# Patient Record
Sex: Female | Born: 1950 | Race: White | Hispanic: No | Marital: Married | Smoking: Former smoker
Health system: Southern US, Community
[De-identification: ages and names within clinical notes are randomized; demographics above are authoritative.]

## PROBLEM LIST (undated history)

## (undated) DIAGNOSIS — E785 Hyperlipidemia, unspecified: Secondary | ICD-10-CM

## (undated) DIAGNOSIS — R0602 Shortness of breath: Secondary | ICD-10-CM

## (undated) DIAGNOSIS — C801 Malignant (primary) neoplasm, unspecified: Secondary | ICD-10-CM

## (undated) DIAGNOSIS — R7309 Other abnormal glucose: Secondary | ICD-10-CM

## (undated) DIAGNOSIS — F329 Major depressive disorder, single episode, unspecified: Secondary | ICD-10-CM

## (undated) DIAGNOSIS — J449 Chronic obstructive pulmonary disease, unspecified: Secondary | ICD-10-CM

## (undated) DIAGNOSIS — F419 Anxiety disorder, unspecified: Secondary | ICD-10-CM

## (undated) DIAGNOSIS — R7301 Impaired fasting glucose: Secondary | ICD-10-CM

## (undated) DIAGNOSIS — I10 Essential (primary) hypertension: Secondary | ICD-10-CM

## (undated) DIAGNOSIS — Z8659 Personal history of other mental and behavioral disorders: Secondary | ICD-10-CM

## (undated) DIAGNOSIS — N029 Recurrent and persistent hematuria with unspecified morphologic changes: Secondary | ICD-10-CM

## (undated) DIAGNOSIS — F32A Depression, unspecified: Secondary | ICD-10-CM

## (undated) DIAGNOSIS — Z780 Asymptomatic menopausal state: Secondary | ICD-10-CM

## (undated) HISTORY — DX: Essential (primary) hypertension: I10

## (undated) HISTORY — PX: COLONOSCOPY: SHX174

## (undated) HISTORY — PX: CYSTOSCOPY: SUR368

## (undated) HISTORY — DX: Personal history of other mental and behavioral disorders: Z86.59

## (undated) HISTORY — DX: Depression, unspecified: F32.A

## (undated) HISTORY — PX: LYMPH NODE BIOPSY: SHX201

## (undated) HISTORY — DX: Shortness of breath: R06.02

## (undated) HISTORY — DX: Other abnormal glucose: R73.09

## (undated) HISTORY — DX: Recurrent and persistent hematuria with unspecified morphologic changes: N02.9

## (undated) HISTORY — DX: Anxiety disorder, unspecified: F41.9

## (undated) HISTORY — DX: Asymptomatic menopausal state: Z78.0

## (undated) HISTORY — DX: Hyperlipidemia, unspecified: E78.5

## (undated) HISTORY — DX: Major depressive disorder, single episode, unspecified: F32.9

## (undated) HISTORY — DX: Impaired fasting glucose: R73.01

---

## 2003-11-21 ENCOUNTER — Ambulatory Visit (HOSPITAL_COMMUNITY): Admission: RE | Admit: 2003-11-21 | Discharge: 2003-11-21 | Payer: Self-pay | Admitting: Gastroenterology

## 2007-03-06 ENCOUNTER — Emergency Department (HOSPITAL_COMMUNITY): Admission: EM | Admit: 2007-03-06 | Discharge: 2007-03-06 | Payer: Self-pay | Admitting: Emergency Medicine

## 2008-04-25 ENCOUNTER — Other Ambulatory Visit: Admission: RE | Admit: 2008-04-25 | Discharge: 2008-04-25 | Payer: Self-pay | Admitting: Family Medicine

## 2011-01-31 NOTE — Op Note (Signed)
NAME:  Emily West, Emily West                      ACCOUNT NO.:  [id number]   MEDICAL RECORD NO.:  [id number]                   PATIENT TYPE:  AMB   LOCATION:  ENDO                                 FACILITY:  [facility]   PHYSICIAN:  Petra Kuba, M.D.                 DATE OF BIRTH:  08-Jun-1951   DATE OF PROCEDURE:  DATE OF DISCHARGE:                                 OPERATIVE REPORT   PROCEDURE:  Colonoscopy.   INDICATIONS:  Screening, history of diverticula.   Consent was signed after risks, benefits, methods, options thoroughly  discussed times in the office.   MEDICINES USED:  Demerol 80, Versed 8.   PROCEDURE:  Rectal inspection is pertinent for external hemorrhoid, small.  Digital exam is negative.  The video pediatric adjustable colonoscope was  inserted with some difficulty, and despite a low sigmoid anastomosis, we  were able to advance to her cecum.  This did require rolling her on her back  and some abdominal pressure.  There was some looping and tortuosity.  No  obvious abnormality was seen on insertion.  The cecum is identified by the  appendiceal orifice in the ileocecal valve.  The scope was inserted short  ways into the terminal ileum, which was normal.  Photo documentation was  obtained.  The scope was slowly withdrawn.  Prep was fairly adequate.  With  lots of washing and suctioning, adequate visualization was obtained.  The  scope was slowly withdrawn.  The cecum, ascending, and transverse was  normal.  There was a rare early left-sided diverticula remaining.  Although,  sigmoid anastomosis was seen and confirmed.  The scope was withdrawn back to  the rectum.  Anorectal pullthrough and retroflexion confirms some small  hemorrhoids.  There was a little bit of scope trauma on exam with a little  bit of heme.  The scope was reinserted short ways up the left side of the  colon.  Air was suctioned, and the scope removed.  Patient tolerated the  procedure well.  There were no  obvious immediate complications.   DIAGNOSIS:  1. Internal and external hemorrhoids.  2. Rare left-sided diverticula.  3. Low sigmoid anastomosis.  4. Otherwise within normal limits through the terminal ileum.   PLAN:  Yearly rectals and guaiacs per Dr. Chestine Spore.  Have ___________ p.r.n.  Otherwise repeat screening in 5-10 years.                                               Petra Kuba, M.D.    MEM/MEDQ  D:  11/21/2003  T:  11/21/2003  Job:  [id number]   cc:   Clovis Riley, NP  [facility]

## 2011-07-02 LAB — RAPID URINE DRUG SCREEN, HOSP PERFORMED
Amphetamines: NOT DETECTED
Benzodiazepines: NOT DETECTED
Cocaine: NOT DETECTED
Tetrahydrocannabinol: NOT DETECTED

## 2011-07-02 LAB — COMPREHENSIVE METABOLIC PANEL
Alkaline Phosphatase: 81
BUN: 14
Calcium: 9.1
Creatinine, Ser: 0.79
Glucose, Bld: 145 — ABNORMAL HIGH
Potassium: 3.8
Total Protein: 6.9

## 2011-07-02 LAB — SALICYLATE LEVEL: Salicylate Lvl: <4

## 2011-07-02 LAB — ACETAMINOPHEN LEVEL: Acetaminophen (Tylenol), Serum: <10 — ABNORMAL LOW

## 2013-09-13 ENCOUNTER — Encounter: Payer: Self-pay | Admitting: *Deleted

## 2013-09-14 ENCOUNTER — Encounter: Payer: Self-pay | Admitting: *Deleted

## 2013-09-21 ENCOUNTER — Ambulatory Visit: Payer: Self-pay | Admitting: Cardiology

## 2014-08-04 ENCOUNTER — Other Ambulatory Visit: Payer: Self-pay | Admitting: Family Medicine

## 2014-08-04 ENCOUNTER — Ambulatory Visit
Admission: RE | Admit: 2014-08-04 | Discharge: 2014-08-04 | Disposition: A | Discharge status: Home / Self Care | Payer: BC Managed Care – PPO | Source: Ambulatory Visit | Attending: Family Medicine | Admitting: Family Medicine

## 2014-08-04 DIAGNOSIS — M545 Low back pain: Secondary | ICD-10-CM

## 2014-08-08 ENCOUNTER — Emergency Department (HOSPITAL_COMMUNITY): Payer: BC Managed Care – PPO

## 2014-08-08 ENCOUNTER — Encounter (HOSPITAL_COMMUNITY): Payer: Self-pay | Admitting: Emergency Medicine

## 2014-08-08 ENCOUNTER — Inpatient Hospital Stay (HOSPITAL_COMMUNITY)
Admission: EM | Admit: 2014-08-08 | Discharge: 2014-08-22 | DRG: 515 | Disposition: A | Discharge status: Home Health | Payer: BC Managed Care – PPO | Attending: Internal Medicine | Admitting: Internal Medicine

## 2014-08-08 DIAGNOSIS — E871 Hypo-osmolality and hyponatremia: Secondary | ICD-10-CM | POA: Diagnosis present

## 2014-08-08 DIAGNOSIS — D649 Anemia, unspecified: Secondary | ICD-10-CM | POA: Diagnosis present

## 2014-08-08 DIAGNOSIS — M549 Dorsalgia, unspecified: Secondary | ICD-10-CM

## 2014-08-08 DIAGNOSIS — J811 Chronic pulmonary edema: Secondary | ICD-10-CM

## 2014-08-08 DIAGNOSIS — M545 Low back pain, unspecified: Secondary | ICD-10-CM

## 2014-08-08 DIAGNOSIS — J810 Acute pulmonary edema: Secondary | ICD-10-CM | POA: Diagnosis present

## 2014-08-08 DIAGNOSIS — C3490 Malignant neoplasm of unspecified part of unspecified bronchus or lung: Secondary | ICD-10-CM | POA: Diagnosis present

## 2014-08-08 DIAGNOSIS — R159 Full incontinence of feces: Secondary | ICD-10-CM | POA: Diagnosis not present

## 2014-08-08 DIAGNOSIS — J9621 Acute and chronic respiratory failure with hypoxia: Secondary | ICD-10-CM | POA: Diagnosis not present

## 2014-08-08 DIAGNOSIS — F329 Major depressive disorder, single episode, unspecified: Secondary | ICD-10-CM | POA: Diagnosis present

## 2014-08-08 DIAGNOSIS — C7951 Secondary malignant neoplasm of bone: Secondary | ICD-10-CM | POA: Diagnosis present

## 2014-08-08 DIAGNOSIS — E860 Dehydration: Secondary | ICD-10-CM | POA: Diagnosis not present

## 2014-08-08 DIAGNOSIS — I10 Essential (primary) hypertension: Secondary | ICD-10-CM | POA: Diagnosis present

## 2014-08-08 DIAGNOSIS — K5900 Constipation, unspecified: Secondary | ICD-10-CM | POA: Diagnosis not present

## 2014-08-08 DIAGNOSIS — Z800 Family history of malignant neoplasm of digestive organs: Secondary | ICD-10-CM | POA: Diagnosis not present

## 2014-08-08 DIAGNOSIS — M4850XA Collapsed vertebra, not elsewhere classified, site unspecified, initial encounter for fracture: Secondary | ICD-10-CM

## 2014-08-08 DIAGNOSIS — Z79899 Other long term (current) drug therapy: Secondary | ICD-10-CM

## 2014-08-08 DIAGNOSIS — E876 Hypokalemia: Secondary | ICD-10-CM | POA: Diagnosis not present

## 2014-08-08 DIAGNOSIS — G9340 Encephalopathy, unspecified: Secondary | ICD-10-CM | POA: Diagnosis present

## 2014-08-08 DIAGNOSIS — Z7189 Other specified counseling: Secondary | ICD-10-CM

## 2014-08-08 DIAGNOSIS — Z720 Tobacco use: Secondary | ICD-10-CM

## 2014-08-08 DIAGNOSIS — Z9889 Other specified postprocedural states: Secondary | ICD-10-CM | POA: Diagnosis not present

## 2014-08-08 DIAGNOSIS — M8458XA Pathological fracture in neoplastic disease, other specified site, initial encounter for fracture: Secondary | ICD-10-CM | POA: Diagnosis present

## 2014-08-08 DIAGNOSIS — J449 Chronic obstructive pulmonary disease, unspecified: Secondary | ICD-10-CM | POA: Diagnosis present

## 2014-08-08 DIAGNOSIS — C771 Secondary and unspecified malignant neoplasm of intrathoracic lymph nodes: Secondary | ICD-10-CM | POA: Diagnosis present

## 2014-08-08 DIAGNOSIS — I959 Hypotension, unspecified: Secondary | ICD-10-CM | POA: Diagnosis present

## 2014-08-08 DIAGNOSIS — F419 Anxiety disorder, unspecified: Secondary | ICD-10-CM | POA: Diagnosis present

## 2014-08-08 DIAGNOSIS — C7931 Secondary malignant neoplasm of brain: Secondary | ICD-10-CM | POA: Diagnosis present

## 2014-08-08 DIAGNOSIS — F17210 Nicotine dependence, cigarettes, uncomplicated: Secondary | ICD-10-CM | POA: Diagnosis present

## 2014-08-08 DIAGNOSIS — IMO0002 Reserved for concepts with insufficient information to code with codable children: Secondary | ICD-10-CM

## 2014-08-08 DIAGNOSIS — J9610 Chronic respiratory failure, unspecified whether with hypoxia or hypercapnia: Secondary | ICD-10-CM | POA: Insufficient documentation

## 2014-08-08 DIAGNOSIS — Z803 Family history of malignant neoplasm of breast: Secondary | ICD-10-CM | POA: Diagnosis not present

## 2014-08-08 DIAGNOSIS — E785 Hyperlipidemia, unspecified: Secondary | ICD-10-CM | POA: Diagnosis present

## 2014-08-08 DIAGNOSIS — R0902 Hypoxemia: Secondary | ICD-10-CM

## 2014-08-08 DIAGNOSIS — R590 Localized enlarged lymph nodes: Secondary | ICD-10-CM

## 2014-08-08 DIAGNOSIS — Z87898 Personal history of other specified conditions: Secondary | ICD-10-CM

## 2014-08-08 DIAGNOSIS — M8440XA Pathological fracture, unspecified site, initial encounter for fracture: Secondary | ICD-10-CM | POA: Insufficient documentation

## 2014-08-08 DIAGNOSIS — R599 Enlarged lymph nodes, unspecified: Secondary | ICD-10-CM

## 2014-08-08 LAB — I-STAT CHEM 8, ED
BUN: 18 mg/dL (ref 6–23)
CHLORIDE: 100 meq/L (ref 96–112)
CREATININE: 0.8 mg/dL (ref 0.50–1.10)
Calcium, Ion: 1.11 mmol/L — ABNORMAL LOW (ref 1.13–1.30)
GLUCOSE: 122 mg/dL — AB (ref 70–99)
HCT: 41 % (ref 36.0–46.0)
Hemoglobin: 13.9 g/dL (ref 12.0–15.0)
Potassium: 4.2 mEq/L (ref 3.7–5.3)
SODIUM: 133 meq/L — AB (ref 137–147)
TCO2: 23 mmol/L (ref 0–100)

## 2014-08-08 LAB — URINALYSIS, ROUTINE W REFLEX MICROSCOPIC
BILIRUBIN URINE: NEGATIVE
GLUCOSE, UA: NEGATIVE mg/dL
HGB URINE DIPSTICK: NEGATIVE
KETONES UR: NEGATIVE mg/dL
Leukocytes, UA: NEGATIVE
Nitrite: NEGATIVE
PROTEIN: NEGATIVE mg/dL
Specific Gravity, Urine: 1.03 (ref 1.005–1.030)
UROBILINOGEN UA: 0.2 mg/dL (ref 0.0–1.0)
pH: 5 (ref 5.0–8.0)

## 2014-08-08 LAB — CBC WITH DIFFERENTIAL/PLATELET
BASOS PCT: 0 % (ref 0–1)
Basophils Absolute: 0 10*3/uL (ref 0.0–0.1)
EOS ABS: 0.1 10*3/uL (ref 0.0–0.7)
EOS PCT: 1 % (ref 0–5)
HCT: 41.9 % (ref 36.0–46.0)
HEMOGLOBIN: 14.1 g/dL (ref 12.0–15.0)
LYMPHS ABS: 2.7 10*3/uL (ref 0.7–4.0)
Lymphocytes Relative: 18 % (ref 12–46)
MCH: 30.5 pg (ref 26.0–34.0)
MCHC: 33.7 g/dL (ref 30.0–36.0)
MCV: 90.7 fL (ref 78.0–100.0)
MONOS PCT: 8 % (ref 3–12)
Monocytes Absolute: 1.1 10*3/uL — ABNORMAL HIGH (ref 0.1–1.0)
NEUTROS PCT: 73 % (ref 43–77)
Neutro Abs: 11.1 10*3/uL — ABNORMAL HIGH (ref 1.7–7.7)
Platelets: 335 10*3/uL (ref 150–400)
RBC: 4.62 MIL/uL (ref 3.87–5.11)
RDW: 13.7 % (ref 11.5–15.5)
WBC: 15 10*3/uL — ABNORMAL HIGH (ref 4.0–10.5)

## 2014-08-08 LAB — BASIC METABOLIC PANEL
Anion gap: 15 (ref 5–15)
BUN: 18 mg/dL (ref 6–23)
CALCIUM: 9.6 mg/dL (ref 8.4–10.5)
CO2: 23 mEq/L (ref 19–32)
CREATININE: 0.85 mg/dL (ref 0.50–1.10)
Chloride: 97 mEq/L (ref 96–112)
GFR, EST AFRICAN AMERICAN: 85 mL/min — AB (ref >90)
GFR, EST NON AFRICAN AMERICAN: 73 mL/min — AB (ref >90)
GLUCOSE: 106 mg/dL — AB (ref 70–99)
POTASSIUM: 5.8 meq/L — AB (ref 3.7–5.3)
Sodium: 135 mEq/L — ABNORMAL LOW (ref 137–147)

## 2014-08-08 MED ORDER — IOHEXOL 300 MG/ML  SOLN
100.0000 mL | Freq: Once | INTRAMUSCULAR | Status: AC | PRN
  Administered 2014-08-08: 100 mL via INTRAVENOUS

## 2014-08-08 MED ORDER — BUPROPION HCL ER (XL) 300 MG PO TB24
750.0000 mg | ORAL_TABLET | Freq: Every day | ORAL | Status: DC
  Administered 2014-08-10 – 2014-08-14 (×5): 750 mg via ORAL
  Filled 2014-08-08 (×8): qty 1

## 2014-08-08 MED ORDER — FLUOXETINE HCL 20 MG PO CAPS
20.0000 mg | ORAL_CAPSULE | Freq: Three times a day (TID) | ORAL | Status: DC
  Filled 2014-08-08: qty 1

## 2014-08-08 MED ORDER — ONDANSETRON HCL 4 MG/2ML IJ SOLN
4.0000 mg | Freq: Once | INTRAMUSCULAR | Status: AC
  Administered 2014-08-08: 4 mg via INTRAVENOUS
  Filled 2014-08-08: qty 2

## 2014-08-08 MED ORDER — NICOTINE 14 MG/24HR TD PT24
14.0000 mg | MEDICATED_PATCH | Freq: Every day | TRANSDERMAL | Status: DC
  Administered 2014-08-10 – 2014-08-18 (×9): 14 mg via TRANSDERMAL
  Filled 2014-08-08 (×12): qty 1

## 2014-08-08 MED ORDER — CYCLOBENZAPRINE HCL 10 MG PO TABS
10.0000 mg | ORAL_TABLET | Freq: Every day | ORAL | Status: DC
  Administered 2014-08-08 – 2014-08-13 (×6): 10 mg via ORAL
  Filled 2014-08-08 (×7): qty 1

## 2014-08-08 MED ORDER — IOHEXOL 300 MG/ML  SOLN
50.0000 mL | Freq: Once | INTRAMUSCULAR | Status: AC | PRN
  Administered 2014-08-08: 50 mL via ORAL

## 2014-08-08 MED ORDER — FLUOXETINE HCL 20 MG PO CAPS
20.0000 mg | ORAL_CAPSULE | Freq: Three times a day (TID) | ORAL | Status: DC
  Administered 2014-08-09 – 2014-08-17 (×25): 20 mg via ORAL
  Filled 2014-08-08 (×27): qty 1

## 2014-08-08 MED ORDER — HYDROMORPHONE HCL 1 MG/ML IJ SOLN
0.5000 mg | Freq: Once | INTRAMUSCULAR | Status: AC
  Administered 2014-08-08: 0.5 mg via INTRAVENOUS
  Filled 2014-08-08: qty 1

## 2014-08-08 MED ORDER — SODIUM CHLORIDE 0.9 % IV SOLN
INTRAVENOUS | Status: DC
  Administered 2014-08-08 – 2014-08-09 (×2): via INTRAVENOUS

## 2014-08-08 MED ORDER — ZOLPIDEM TARTRATE 5 MG PO TABS: 5.0000 mg | ORAL_TABLET | Freq: Every evening | ORAL | Status: DC | PRN

## 2014-08-08 MED ORDER — ONDANSETRON 4 MG PO TBDP
4.0000 mg | ORAL_TABLET | Freq: Three times a day (TID) | ORAL | Status: DC | PRN
  Administered 2014-08-17: 4 mg via ORAL
  Filled 2014-08-08 (×2): qty 1

## 2014-08-08 MED ORDER — CYCLOBENZAPRINE HCL 10 MG PO TABS
10.0000 mg | ORAL_TABLET | Freq: Every day | ORAL | Status: DC
  Filled 2014-08-08: qty 1

## 2014-08-08 MED ORDER — PANTOPRAZOLE SODIUM 40 MG PO TBEC
40.0000 mg | DELAYED_RELEASE_TABLET | Freq: Every day | ORAL | Status: DC
  Filled 2014-08-08: qty 1

## 2014-08-08 MED ORDER — IRBESARTAN 75 MG PO TABS
75.0000 mg | ORAL_TABLET | Freq: Every day | ORAL | Status: DC
  Filled 2014-08-08 (×4): qty 1

## 2014-08-08 MED ORDER — HYDROMORPHONE HCL 1 MG/ML IJ SOLN
1.0000 mg | INTRAMUSCULAR | Status: DC | PRN
  Administered 2014-08-08 – 2014-08-09 (×5): 1 mg via INTRAVENOUS
  Filled 2014-08-08 (×5): qty 1

## 2014-08-08 MED ORDER — SIMVASTATIN 40 MG PO TABS
40.0000 mg | ORAL_TABLET | Freq: Every day | ORAL | Status: DC
  Administered 2014-08-08 – 2014-08-21 (×14): 40 mg via ORAL
  Filled 2014-08-08 (×15): qty 1

## 2014-08-08 MED ORDER — VALSARTAN-HYDROCHLOROTHIAZIDE 80-12.5 MG PO TABS: 1.0000 | ORAL_TABLET | Freq: Every day | ORAL | Status: DC

## 2014-08-08 MED ORDER — CYCLOBENZAPRINE HCL 10 MG PO TABS: 10.0000 mg | ORAL_TABLET | Freq: Every day | ORAL | Status: DC

## 2014-08-08 MED ORDER — HYDROMORPHONE HCL 1 MG/ML IJ SOLN
1.0000 mg | Freq: Once | INTRAMUSCULAR | Status: AC
Start: 2014-08-08 — End: 2014-08-08
  Administered 2014-08-08: 1 mg via INTRAVENOUS
  Filled 2014-08-08: qty 1

## 2014-08-08 MED ORDER — ARIPIPRAZOLE 2 MG PO TABS
2.0000 mg | ORAL_TABLET | Freq: Every day | ORAL | Status: DC
  Administered 2014-08-10 – 2014-08-17 (×8): 2 mg via ORAL
  Filled 2014-08-08 (×9): qty 1

## 2014-08-08 MED ORDER — HYDROMORPHONE HCL 1 MG/ML IJ SOLN
1.0000 mg | Freq: Once | INTRAMUSCULAR | Status: AC
  Administered 2014-08-08: 1 mg via INTRAVENOUS
  Filled 2014-08-08: qty 1

## 2014-08-08 MED ORDER — ZOLPIDEM TARTRATE 10 MG PO TABS: 10.0000 mg | ORAL_TABLET | Freq: Every evening | ORAL | Status: DC | PRN

## 2014-08-08 MED ORDER — TIOTROPIUM BROMIDE MONOHYDRATE 18 MCG IN CAPS
18.0000 ug | ORAL_CAPSULE | Freq: Every day | RESPIRATORY_TRACT | Status: DC
  Administered 2014-08-10 – 2014-08-22 (×11): 18 ug via RESPIRATORY_TRACT
  Filled 2014-08-08 (×4): qty 5

## 2014-08-08 MED ORDER — HYDROCHLOROTHIAZIDE 12.5 MG PO CAPS
12.5000 mg | ORAL_CAPSULE | Freq: Every day | ORAL | Status: DC
  Filled 2014-08-08 (×4): qty 1

## 2014-08-08 MED ORDER — GADOBENATE DIMEGLUMINE 529 MG/ML IV SOLN
20.0000 mL | Freq: Once | INTRAVENOUS | Status: AC | PRN
  Administered 2014-08-08: 20 mL via INTRAVENOUS

## 2014-08-08 NOTE — ED Notes (Signed)
Patient transported to MRI 

## 2014-08-08 NOTE — ED Notes (Signed)
Dr Betsey Holiday bedside

## 2014-08-08 NOTE — ED Notes (Signed)
Patient transported to CT 

## 2014-08-08 NOTE — ED Notes (Signed)
Contacted patients spouse Tim per her request

## 2014-08-08 NOTE — Consult Note (Signed)
Re:   Emily West [facility] DOB:   Apr 21, 1951 MRN:   [id number]  [facility] Consultation note  ASSESSMENT AND PLAN: 1.  Pathologic fracture - T10 and L3  2.  Adenopathy - thoracic, supraclavicular, right axilla  Discussed biopsy of a lymph node tomorrow both with the patient and her husband (by phone).  Will try to schedule.  Keep NPO past MN.  3.  History of prior left breast phylloides tumor  Surgery by Emily West, reconstruction by Emily West 4.  HTN 5.  History of anxiety/depression 6.  Gallstones 7.  Hiatal hernia on CT 8.  Smokes - about one PPD  Chief Complaint  Patient presents with  . Back Pain    lower   REFERRING PHYSICIAN:  Dr. Wardell West, hospitalist  HISTORY OF PRESENT ILLNESS: Emily West is a 63 y.o. (DOB: 1951/06/03) white  female whose primary care physician is Emily West. (Dr. Arvid Right FP) and comes to [facility] today for back pain.  The pain in her back began about 2 weeks ago.  She has had some chronic back issues and thought it was just an exacerbation of that.  She was seen by a chiropractor at [place], without any improvement in her symptoms.  Last Thursday, 08/03/2014, she saw Emily West, who placed her on a muscle relaxant and steroids (Prednisone), but the pain persisted.  She had lumbar spine films on 08/04/2014, which were negative.  It sounds like she contacted Emily West office several times yesterday because of persistent back pain, but was referred to the [facility] today.  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  CT chest/abdomen - 08/09/2014 - Thoracic lymphadenopathy, including right supraclavicular and axillary lymphadenopathy, as above. Emily suspicious lymphadenopathy in the abdomen or pelvis.   Pathologic fractures involving T10 and L3, better evaluated on MRI. Hgb - 14.1 - 08/08/2014 K+ - 5.8 - 08/08/2014   Past Medical History    Diagnosis Date  . History of suicidal ideation   . HTN (hypertension)   . HLD (hyperlipidemia)   . Shortness of breath   . Anxiety and depression   . Menopause   . Benign hematuria   . Impaired fasting glucose   . Other abnormal glucose       Past Surgical History  Procedure Laterality Date  . Colonoscopy  11/21/2003     rare early left-sided diverticula remaining, low sigmoid anastomosis, internal and external hemorrhoids,   . Cystoscopy        Current Facility-Administered Medications  Medication Dose Route Frequency Provider Last Rate Last Dose  . HYDROmorphone (DILAUDID) injection 1 mg  1 mg Intravenous Q3H PRN Emily Karlyne Greenspan, MD      . nicotine (NICODERM CQ - dosed in mg/24 hours) patch 14 mg  14 mg Transdermal Daily Emily Karlyne Greenspan, MD       Current Outpatient Prescriptions  Medication Sig Dispense Refill  . ARIPiprazole (ABILIFY) 2 MG tablet Take 2 mg by mouth daily.    Marland Kitchen buPROPion (WELLBUTRIN XL) 150 MG 24 hr tablet Take 750 mg by mouth daily.    . cyclobenzaprine (FLEXERIL) 10 MG tablet Take 10 mg by mouth at bedtime.    Marland Kitchen esomeprazole (NEXIUM) 20 MG capsule Take 20 mg by mouth every morning.    Marland Kitchen FLUoxetine (PROZAC) 20 MG capsule Take 20 mg by mouth 3 (three) times daily.    Marland Kitchen  methocarbamol (ROBAXIN) 500 MG tablet Take 1,000 mg by mouth every 3 (three) hours as needed for muscle spasms.    . simvastatin (ZOCOR) 40 MG tablet Take 40 mg by mouth at bedtime.     Marland Kitchen tiotropium (SPIRIVA) 18 MCG inhalation capsule Place 18 mcg into inhaler and inhale daily.    . valsartan-hydrochlorothiazide (DIOVAN-HCT) 80-12.5 MG per tablet Take 1 tablet by mouth daily.    Marland Kitchen zolpidem (AMBIEN) 10 MG tablet Take 10 mg by mouth at bedtime as needed for sleep.    . predniSONE (DELTASONE) 10 MG tablet Take 10 mg by mouth daily with breakfast.       Emily Known Allergies  REVIEW OF SYSTEMS: Skin:  Emily history of rash.  Emily history of abnormal moles. Infection:  Emily history of hepatitis or  HIV.  Emily history of MRSA. Neurologic:  Emily history of stroke.  Emily history of seizure.  Emily history of headaches. Cardiac:  Hypertension x 15 years. Pulmonary:  Smokes x 30 years..  Endocrine:  Emily diabetes. Emily thyroid disease. Gastrointestinal:  She had perforated diverticulitis around 1994.  She had a ostomy, then this was reversed.  Dr. Harlow West was her surgeon.  Hiatal hernia and gallstones on CT scan. Urologic:  Emily history of kidney stones.  Emily history of bladder infections. Musculoskeletal:  See HPI. Hematologic:  Emily bleeding disorder.  Emily history of anemia.  Not anticoagulated. Psycho-social:  The patient is oriented.   She has a history of anxiety and depression.  SOCIAL and FAMILY HISTORY: Married.  Husband Emily West.  Cell phone numbers:  [phone] and [phone].  PHYSICAL EXAM: BP 148/84 mmHg  Pulse 81  Temp(Src) 97.5 F (36.4 C) (Oral)  Resp 20  SpO2 93%  General: Mildly obese WF who is alert.  She has a little slurred speech from medication. HEENT: Normal. Pupils equal. Neck: Supple. Emily mass.  Emily thyroid mass. Lymph Nodes:  Emily supraclavicular or cervical nodes.  Though there are enlarged supraclavicular nodes on CT, I cannot feel them at this time.  I can feel an approx 2.0 cm node high in her right axilla.  Breasts:  Left - reconstructed  Right - Emily mass Lungs: Clear to auscultation and symmetric breath sounds. Heart:  RRR. Emily murmur or rub. Abdomen: Soft. Emily mass. Emily tenderness. Emily hernia. Normal bowel sounds.  Well healed midline abdominal scar and ostomy scar.  I do not feel a hernia. Rectal: Not done. Extremities:  Moves upper and lower exteremities. Psychiatric: Behavior is normal.   She is a little droggy from medication.  DATA REVIEWED: Epic notes.  Alphonsa Overall, MD,  [facility], [address]  [place], [place]    [place] Phone:  [phone] FAX:  [phone]

## 2014-08-08 NOTE — ED Notes (Signed)
MD at bedside. 

## 2014-08-08 NOTE — H&P (Signed)
History and Physical    [facility] WNI:[id number] DOB: 05/28/1951 DOA: 08/08/2014  Referring physician: Dr. Betsey Holiday PCP: No primary care provider on file.  Specialists: general surgery, Dr. Lucia Gaskins  Chief Complaint: back pain  HPI: Emily West is a 63 y.o. female has a past medical history significant for HTN, HLD, anxiety, tobacco abuse, presents to the ED with chief complaint of severe back pain worsening for the past 2 weeks. She has a history of low grade chronic back pain for years however in the past 2 weeks her pain has been excruciating and is barely able to move. She saw a chiropractor few days ago without improvement in her pain. She denies any irradiation of her pain. She denies any fevers or chills, denies any nausea/vomiting/GI upset, denies chest pain or breathing difficulties. She denies any weight loss or night sweats. She is a smoker and has been smoking for the past 30 years, "on and off". She also has a history of a "benign left breast mass" removed about 20 years ago with reconstruction x 2, most recent last year. In the ED patient underwent an MRI of the T/L spine which showed mediastinal lymphadenopathy worrisome for metastatic disease as well as T10 and L3 pathologic fractures. She then underwent a CT chest/abd/pelvis w/ contrast which showed again extensive lymphadenopathy. Patient is in excruciating pain and barely able to move and with new findings of likely metastatic disease [facility] asked for admission.    Review of Systems: as per HPI otherwise negative   Past Medical History  Diagnosis Date  . History of suicidal ideation   . HTN (hypertension)   . HLD (hyperlipidemia)   . Shortness of breath   . Anxiety and depression   . Menopause   . Benign hematuria   . Impaired fasting glucose   . Other abnormal glucose    Past Surgical History  Procedure Laterality Date  . Colonoscopy  11/21/2003     rare early left-sided diverticula remaining, low  sigmoid anastomosis, internal and external hemorrhoids,   . Cystoscopy     Social History:  reports that she has been smoking.  She started smoking about 36 years ago. She does not have any smokeless tobacco history on file. She reports that she drinks alcohol. She reports that she does not use illicit drugs.  No Known Allergies  Family History  Problem Relation Age of Onset  . Emphysema Father   . Depression Mother   . Glaucoma Mother   . Thyroid disease Mother   . CVA Mother   . Breast cancer Mother   . Colon cancer Maternal Grandmother   . CVA Brother 16    1/3  . Thyroid disease Sister     1/2, partial thyroidectomy  . Goiter Sister     2/2    Prior to Admission medications   Medication Sig Start Date End Date Taking? Authorizing Provider  ARIPiprazole (ABILIFY) 2 MG tablet Take 2 mg by mouth daily.   Yes Historical Provider, MD  buPROPion (WELLBUTRIN XL) 150 MG 24 hr tablet Take 750 mg by mouth daily.   Yes Historical Provider, MD  cyclobenzaprine (FLEXERIL) 10 MG tablet Take 10 mg by mouth at bedtime.   Yes Historical Provider, MD  esomeprazole (NEXIUM) 20 MG capsule Take 20 mg by mouth every morning.   Yes Historical Provider, MD  FLUoxetine (PROZAC) 20 MG capsule Take 20 mg by mouth 3 (three) times daily.   Yes Historical Provider, MD  methocarbamol (ROBAXIN) 500 MG tablet Take 1,000 mg by mouth every 3 (three) hours as needed for muscle spasms.   Yes Historical Provider, MD  simvastatin (ZOCOR) 40 MG tablet Take 40 mg by mouth at bedtime.    Yes Historical Provider, MD  tiotropium (SPIRIVA) 18 MCG inhalation capsule Place 18 mcg into inhaler and inhale daily.   Yes Historical Provider, MD  valsartan-hydrochlorothiazide (DIOVAN-HCT) 80-12.5 MG per tablet Take 1 tablet by mouth daily.   Yes Historical Provider, MD  zolpidem (AMBIEN) 10 MG tablet Take 10 mg by mouth at bedtime as needed for sleep.   Yes Historical Provider, MD  predniSONE (DELTASONE) 10 MG tablet Take 10 mg  by mouth daily with breakfast.    Historical Provider, MD   Physical Exam: Filed Vitals:   08/08/14 0922 08/08/14 1212 08/08/14 1459  BP: 146/64 132/76 152/80  Pulse: 98 85 88  Temp: 97.9 F (36.6 C) 98 F (36.7 C)   TempSrc:  Oral   Resp: 18 16 16   SpO2: 99% 92% 92%     General:  No apparent distress, anxious  Eyes: no scleral icterus  ENT: moist oropharynx  Neck: supple, no JVD  Cardiovascular: regular rate without MRG; 2+ peripheral pulses  Respiratory: CTA biL, good air movement without wheezing, rhonchi or crackled  Abdomen: soft, non tender to palpation, positive bowel sounds  Skin: no rashes  Musculoskeletal: no peripheral edema  Psychiatric: normal mood and affect  Neurologic: non focal, sensation intact LE, strength 5/5 in all 4  Labs on Admission:  Basic Metabolic Panel:  Recent Labs Lab 08/08/14 0951 08/08/14 1540  NA 135* 133*  K 5.8* 4.2  CL 97 100  CO2 23  --   GLUCOSE 106* 122*  BUN 18 18  CREATININE 0.85 0.80  CALCIUM 9.6  --    CBC:  Recent Labs Lab 08/08/14 0951 08/08/14 1540  WBC 15.0*  --   NEUTROABS 11.1*  --   HGB 14.1 13.9  HCT 41.9 41.0  MCV 90.7  --   PLT 335  --    Radiological Exams on Admission: Ct Chest W Contrast  08/08/2014   CLINICAL DATA:  Mediastinal lymphadenopathy on thoracic spine MRI. History of breast cancer.  EXAM: CT CHEST, ABDOMEN, AND PELVIS WITH CONTRAST  TECHNIQUE: Multidetector CT imaging of the chest, abdomen and pelvis was performed following the standard protocol during bolus administration of intravenous contrast.  CONTRAST:  170mL OMNIPAQUE IOHEXOL 300 MG/ML  SOLN  COMPARISON:  Thoracic and lumbar spine MRI dated 08/08/2014  FINDINGS: CT CHEST FINDINGS  Mild patchy/ground-glass opacities in the left upper lobe (series 5/image 15) and right upper lobe (for example, series 5/ image 18), suspicious for mild pneumonia.  Mild dependent atelectasis in the bilateral lower lobes. No pleural effusion or  pneumothorax.  Visualized thyroid is unremarkable.  Heart is normal in size. No pericardial effusion. Mild atherosclerotic calcifications of the aortic arch.  Thoracic lymphadenopathy, including:  --12 mm short axis right supraclavicular node (series 2/ image 1)  --26 mm short axis high right paratracheal node (series 2/ image 12)  --17 mm short axis right axillary node  --22 mm short axis partially necrotic subcarinal node (series 2/image 26)  Status post left mastectomy with reconstruction.  Mild superior endplate compression fracture deformity at T10 with associated underlying lytic lesion along the posterior vertebral body (sagittal image 59), better evaluated on MRI. Mild retropulsion.  CT ABDOMEN AND PELVIS FINDINGS  Hepatobiliary: Liver is within normal limits. No  suspicious/enhancing hepatic lesions.  Layering gallstones (series 2/image 54), without associated inflammatory changes.  Pancreas: Within normal limits.  Spleen: Within normal limits.  Adrenals/Urinary Tract: Adrenal glands are unremarkable.  Kidneys are within normal limits.  No hydronephrosis.  Bladder is notable for layering excretory MRI contrast.  Stomach/Bowel: Moderate hiatal hernia. Stomach is otherwise within normal limits.  No evidence of bowel obstruction.  Suspected prior appendectomy.  Prior distal colonic resection with anastomosis in the left pelvis (series 2/image 94).  Vascular/Lymphatic: Atherosclerotic calcifications of the abdominal aorta and branch vessels.  No suspicious abdominopelvic lymphadenopathy.  Reproductive: Status post hysterectomy.  No adnexal masses.  Other: No abdominopelvic ascites.  Musculoskeletal: Mild superior endplate compression fracture deformity at L3 with associated lytic lesion along the anterior vertebral body (sagittal image 27). No retropulsion.  Lucencies involving the sacrum likely reflect focal fat when correlating with MRI.  IMPRESSION: Thoracic lymphadenopathy, including right supraclavicular  and axillary lymphadenopathy, as above. Primary differential considerations include nonvisualized (right) breast or lung cancer, or possibly lymphoma.  No suspicious lymphadenopathy in the abdomen or pelvis.  Pathologic fractures involving T10 and L3, better evaluated on MRI.   Electronically Signed   By: Julian Hy M.D.   On: 08/08/2014 15:09   Mr Thoracic Spine W Wo Contrast  08/08/2014   CLINICAL DATA:  Progressive low back pain over the last 2 weeks with radiation into the leg and difficulty ambulating. History of breast cancer. Initial encounter.  EXAM: MRI THORACIC AND LUMBAR SPINE WITHOUT AND WITH CONTRAST  TECHNIQUE: Multiplanar and multiecho pulse sequences of the thoracic and lumbar spine were obtained without and with intravenous contrast.  CONTRAST:  30mL MULTIHANCE GADOBENATE DIMEGLUMINE 529 MG/ML IV SOLN  COMPARISON:  Lumbar spine radiographs 08/04/2014.  FINDINGS: MR THORACIC SPINE FINDINGS  Sagittal localizing images demonstrate no definite abnormalities within the cervical spine. There is diffuse replacement of the normal marrow signal within the T10 vertebral body associated with a mild superior endplate compression deformity and mild osseous retropulsion on the right. Post-contrast, there is diffuse enhancement of the T10 vertebral body. Appearance is highly worrisome for metastatic disease with a pathologic fracture. There may be a small amount of enhancing epidural tumor on the right. No cord compression is present.  No other osseous metastases or fractures are seen within the thoracic spine.  The thoracic cord is normal in signal and caliber. There is no abnormal intradural enhancement.  There is extensive mediastinal lymphadenopathy. There is a large right paratracheal mass measuring 4.5 x 3.5 cm. There is a subcarinal nodal mass measuring 2.4 x 3.5 cm. There is probable lower esophageal adenopathy as well.  There is minimal thoracic spondylosis without disc herniation or foraminal  compromise.  MR LUMBAR SPINE FINDINGS  There are 5 lumbar type vertebral bodies. There is superior endplate Schmorl's node at L3. Bone marrow edema and enhancement are present throughout the L3 vertebral body. There is no osseous retropulsion or epidural tumor. Although less concerning than the findings at T10, this likely represents metastatic disease as well.  No other osseous metastases or fractures are seen within the lumbar spine.  The conus medullaris extends to the L1-2 disc space level. There is no abnormal intradural enhancement. No paraspinal mass is demonstrated.  There is mild lumbar spondylosis. At L2-3 and L3-4, there is mild disc bulging without resulting spinal stenosis or nerve root encroachment. At L4-5, the disc bulging is eccentric laterally to the left but does not causes any L4 nerve root encroachment. Disc degeneration  and osteophytes at L5-S1 are asymmetric to the right. There are mild facet degenerative changes throughout the lower lumbar spine.  IMPRESSION: 1. Mediastinal lymphadenopathy highly worrisome for metastatic disease. This pattern is atypical for metastatic breast cancer and is more suggestive of lung cancer or possibly lymphoma. CT (at least of the chest; consider including the abdomen and pelvis) recommended for further evaluation. 2. Fractures at T10 and L3 are both suspicious for pathologic fractures from underlying metastatic disease. There is possibly a small amount of epidural tumor associated with the T10 fracture. No cord compression or abnormal intradural enhancement identified. 3. These results were called by telephone at the time of interpretation on 08/08/2014 at 12:14 pm to Dr. Joseph Berkshire , who verbally acknowledged these results.   Electronically Signed   By: Camie Patience M.D.   On: 08/08/2014 12:16   Mr Lumbar Spine W Wo Contrast  08/08/2014   CLINICAL DATA:  Progressive low back pain over the last 2 weeks with radiation into the leg and difficulty  ambulating. History of breast cancer. Initial encounter.  EXAM: MRI THORACIC AND LUMBAR SPINE WITHOUT AND WITH CONTRAST  TECHNIQUE: Multiplanar and multiecho pulse sequences of the thoracic and lumbar spine were obtained without and with intravenous contrast.  CONTRAST:  51mL MULTIHANCE GADOBENATE DIMEGLUMINE 529 MG/ML IV SOLN  COMPARISON:  Lumbar spine radiographs 08/04/2014.  FINDINGS: MR THORACIC SPINE FINDINGS  Sagittal localizing images demonstrate no definite abnormalities within the cervical spine. There is diffuse replacement of the normal marrow signal within the T10 vertebral body associated with a mild superior endplate compression deformity and mild osseous retropulsion on the right. Post-contrast, there is diffuse enhancement of the T10 vertebral body. Appearance is highly worrisome for metastatic disease with a pathologic fracture. There may be a small amount of enhancing epidural tumor on the right. No cord compression is present.  No other osseous metastases or fractures are seen within the thoracic spine.  The thoracic cord is normal in signal and caliber. There is no abnormal intradural enhancement.  There is extensive mediastinal lymphadenopathy. There is a large right paratracheal mass measuring 4.5 x 3.5 cm. There is a subcarinal nodal mass measuring 2.4 x 3.5 cm. There is probable lower esophageal adenopathy as well.  There is minimal thoracic spondylosis without disc herniation or foraminal compromise.  MR LUMBAR SPINE FINDINGS  There are 5 lumbar type vertebral bodies. There is superior endplate Schmorl's node at L3. Bone marrow edema and enhancement are present throughout the L3 vertebral body. There is no osseous retropulsion or epidural tumor. Although less concerning than the findings at T10, this likely represents metastatic disease as well.  No other osseous metastases or fractures are seen within the lumbar spine.  The conus medullaris extends to the L1-2 disc space level. There is no  abnormal intradural enhancement. No paraspinal mass is demonstrated.  There is mild lumbar spondylosis. At L2-3 and L3-4, there is mild disc bulging without resulting spinal stenosis or nerve root encroachment. At L4-5, the disc bulging is eccentric laterally to the left but does not causes any L4 nerve root encroachment. Disc degeneration and osteophytes at L5-S1 are asymmetric to the right. There are mild facet degenerative changes throughout the lower lumbar spine.  IMPRESSION: 1. Mediastinal lymphadenopathy highly worrisome for metastatic disease. This pattern is atypical for metastatic breast cancer and is more suggestive of lung cancer or possibly lymphoma. CT (at least of the chest; consider including the abdomen and pelvis) recommended for further evaluation. 2. Fractures  at T10 and L3 are both suspicious for pathologic fractures from underlying metastatic disease. There is possibly a small amount of epidural tumor associated with the T10 fracture. No cord compression or abnormal intradural enhancement identified. 3. These results were called by telephone at the time of interpretation on 08/08/2014 at 12:14 pm to Dr. Joseph Berkshire , who verbally acknowledged these results.   Electronically Signed   By: Camie Patience M.D.   On: 08/08/2014 12:16   Ct Abdomen Pelvis W Contrast  08/08/2014   CLINICAL DATA:  Mediastinal lymphadenopathy on thoracic spine MRI. History of breast cancer.  EXAM: CT CHEST, ABDOMEN, AND PELVIS WITH CONTRAST  TECHNIQUE: Multidetector CT imaging of the chest, abdomen and pelvis was performed following the standard protocol during bolus administration of intravenous contrast.  CONTRAST:  149mL OMNIPAQUE IOHEXOL 300 MG/ML  SOLN  COMPARISON:  Thoracic and lumbar spine MRI dated 08/08/2014  FINDINGS: CT CHEST FINDINGS  Mild patchy/ground-glass opacities in the left upper lobe (series 5/image 15) and right upper lobe (for example, series 5/ image 18), suspicious for mild pneumonia.   Mild dependent atelectasis in the bilateral lower lobes. No pleural effusion or pneumothorax.  Visualized thyroid is unremarkable.  Heart is normal in size. No pericardial effusion. Mild atherosclerotic calcifications of the aortic arch.  Thoracic lymphadenopathy, including:  --12 mm short axis right supraclavicular node (series 2/ image 1)  --26 mm short axis high right paratracheal node (series 2/ image 12)  --17 mm short axis right axillary node  --22 mm short axis partially necrotic subcarinal node (series 2/image 26)  Status post left mastectomy with reconstruction.  Mild superior endplate compression fracture deformity at T10 with associated underlying lytic lesion along the posterior vertebral body (sagittal image 59), better evaluated on MRI. Mild retropulsion.  CT ABDOMEN AND PELVIS FINDINGS  Hepatobiliary: Liver is within normal limits. No suspicious/enhancing hepatic lesions.  Layering gallstones (series 2/image 54), without associated inflammatory changes.  Pancreas: Within normal limits.  Spleen: Within normal limits.  Adrenals/Urinary Tract: Adrenal glands are unremarkable.  Kidneys are within normal limits.  No hydronephrosis.  Bladder is notable for layering excretory MRI contrast.  Stomach/Bowel: Moderate hiatal hernia. Stomach is otherwise within normal limits.  No evidence of bowel obstruction.  Suspected prior appendectomy.  Prior distal colonic resection with anastomosis in the left pelvis (series 2/image 94).  Vascular/Lymphatic: Atherosclerotic calcifications of the abdominal aorta and branch vessels.  No suspicious abdominopelvic lymphadenopathy.  Reproductive: Status post hysterectomy.  No adnexal masses.  Other: No abdominopelvic ascites.  Musculoskeletal: Mild superior endplate compression fracture deformity at L3 with associated lytic lesion along the anterior vertebral body (sagittal image 27). No retropulsion.  Lucencies involving the sacrum likely reflect focal fat when correlating  with MRI.  IMPRESSION: Thoracic lymphadenopathy, including right supraclavicular and axillary lymphadenopathy, as above. Primary differential considerations include nonvisualized (right) breast or lung cancer, or possibly lymphoma.  No suspicious lymphadenopathy in the abdomen or pelvis.  Pathologic fractures involving T10 and L3, better evaluated on MRI.   Electronically Signed   By: Julian Hy M.D.   On: 08/08/2014 15:09     Assessment/Plan Principal Problem:   Pathological fracture Active Problems:   Lymphadenopathy, mediastinal   HTN (hypertension)   History of lump of left breast    Back pain - likely in the setting of pathological fractures T10 and L3 - MRI with possible small amount epidural tumor without cord compression or abnormal intradural enhancement identified - no neuro deficits - pain control  -  PT consult  Mediastinal lymphadenopathy - CT scan with supraclavicular and axillary lymph node enlargement - have consulted general surgery for biopsy, will make NPO after MN for potential axillary node biopsy - oncology consult  Tobacco abuse - nicotine patch  HTN - continue home medications  Anxiety/Depression - continue home medications    Diet: regular, NPO after midnight Fluids: none  DVT Prophylaxis: SCD  Code Status: Full, presumed  Family Communication: d/w husband bedside  Disposition Plan: admit to MedSurg  Time spent: 42  Graycen Degan M. Cruzita Lederer, MD [facility] Pager [phone]  If 7PM-7AM, please contact night-coverage [email or web address] Password [facility] 08/08/2014, 4:51 PM

## 2014-08-08 NOTE — Plan of Care (Signed)
Problem: Phase I Progression Outcomes Goal: Voiding-avoid urinary catheter unless indicated Outcome: Completed/Met Date Met:  08/08/14

## 2014-08-08 NOTE — ED Notes (Signed)
Husband would like to be notified when Pt is moved to floor. Phone number is [phone].

## 2014-08-08 NOTE — ED Notes (Signed)
Back pain for two weeks. Pain is worse today and radiating to right lower back. No sciatica HX. Pt states that is painful more when sitting.

## 2014-08-08 NOTE — ED Provider Notes (Signed)
CSN: [id number]     Arrival date & time 08/08/14  0911 History   First MD Initiated Contact with Patient 08/08/14 [phone]     Chief Complaint  Patient presents with  . Back Pain    lower     (Consider location/radiation/quality/duration/timing/severity/associated sxs/prior Treatment) HPI Comments: To the ER for evaluation of back pain. Patient reports that she has been having mid and low back pain for 2 weeks. Pain has progressively worsened. She does not have radiation of pain into the legs. Patient reports severe constipation that significantly worsens if she moves even slightly.  Patient is a 63 y.o. female presenting with back pain.  Back Pain   Past Medical History  Diagnosis Date  . History of suicidal ideation   . HTN (hypertension)   . HLD (hyperlipidemia)   . Shortness of breath   . Anxiety and depression   . Menopause   . Benign hematuria   . Impaired fasting glucose   . Other abnormal glucose    Past Surgical History  Procedure Laterality Date  . Colonoscopy  11/21/2003     rare early left-sided diverticula remaining, low sigmoid anastomosis, internal and external hemorrhoids,   . Cystoscopy     Family History  Problem Relation Age of Onset  . Emphysema Father   . Depression Mother   . Glaucoma Mother   . Thyroid disease Mother   . CVA Mother   . Breast cancer Mother   . Colon cancer Maternal Grandmother   . CVA Brother 59    1/3  . Thyroid disease Sister     1/2, partial thyroidectomy  . Goiter Sister     2/2   History  Substance Use Topics  . Smoking status: Current Every Day Smoker -- 0.50 packs/day for 35 years    Start date: 09/15/1977  . Smokeless tobacco: Not on file  . Alcohol Use: Yes     Comment: 2-3 times per week   OB History    No data available     Review of Systems  Musculoskeletal: Positive for back pain.  All other systems reviewed and are negative.     Allergies  Review of patient's allergies indicates no known  allergies.  Home Medications   Prior to Admission medications   Medication Sig Start Date End Date Taking? Authorizing Provider  ARIPiprazole (ABILIFY) 2 MG tablet Take 2 mg by mouth daily.   Yes Historical Provider, MD  buPROPion (WELLBUTRIN XL) 150 MG 24 hr tablet Take 750 mg by mouth daily.   Yes Historical Provider, MD  cyclobenzaprine (FLEXERIL) 10 MG tablet Take 10 mg by mouth at bedtime.   Yes Historical Provider, MD  esomeprazole (NEXIUM) 20 MG capsule Take 20 mg by mouth every morning.   Yes Historical Provider, MD  FLUoxetine (PROZAC) 20 MG capsule Take 20 mg by mouth 3 (three) times daily.   Yes Historical Provider, MD  methocarbamol (ROBAXIN) 500 MG tablet Take 1,000 mg by mouth every 3 (three) hours as needed for muscle spasms.   Yes Historical Provider, MD  simvastatin (ZOCOR) 40 MG tablet Take 40 mg by mouth at bedtime.    Yes Historical Provider, MD  tiotropium (SPIRIVA) 18 MCG inhalation capsule Place 18 mcg into inhaler and inhale daily.   Yes Historical Provider, MD  valsartan-hydrochlorothiazide (DIOVAN-HCT) 80-12.5 MG per tablet Take 1 tablet by mouth daily.   Yes Historical Provider, MD  zolpidem (AMBIEN) 10 MG tablet Take 10 mg by mouth at bedtime as  needed for sleep.   Yes Historical Provider, MD  predniSONE (DELTASONE) 10 MG tablet Take 10 mg by mouth daily with breakfast.    Historical Provider, MD   BP 152/80 mmHg  Pulse 88  Temp(Src) 98 F (36.7 C) (Oral)  Resp 16  SpO2 92% Physical Exam  Constitutional: She is oriented to person, place, and time. She appears well-developed and well-nourished. No distress.  HENT:  Head: Normocephalic and atraumatic.  Right Ear: Hearing normal.  Left Ear: Hearing normal.  Nose: Nose normal.  Mouth/Throat: Oropharynx is clear and moist and mucous membranes are normal.  Eyes: Conjunctivae and EOM are normal. Pupils are equal, round, and reactive to light.  Neck: Normal range of motion. Neck supple.  Cardiovascular: Regular  rhythm, S1 normal and S2 normal.  Exam reveals no gallop and no friction rub.   No murmur heard. Pulmonary/Chest: Effort normal and breath sounds normal. No respiratory distress. She exhibits no tenderness.  Abdominal: Soft. Normal appearance and bowel sounds are normal. There is no hepatosplenomegaly. There is no tenderness. There is no rebound, no guarding, no tenderness at McBurney's point and negative Murphy's sign. No hernia.  Musculoskeletal: Normal range of motion.       Lumbar back: She exhibits tenderness.       Back:  Neurological: She is alert and oriented to person, place, and time. She has normal strength. No cranial nerve deficit or sensory deficit. Coordination normal. GCS eye subscore is 4. GCS verbal subscore is 5. GCS motor subscore is 6.  Skin: Skin is warm, dry and intact. No rash noted. No cyanosis.  Psychiatric: She has a normal mood and affect. Her speech is normal and behavior is normal. Thought content normal.  Nursing note and vitals reviewed.   ED Course  Procedures (including critical care time) Labs Review Labs Reviewed  CBC WITH DIFFERENTIAL - Abnormal; Notable for the following:    WBC 15.0 (*)    Neutro Abs 11.1 (*)    Monocytes Absolute 1.1 (*)    All other components within normal limits  BASIC METABOLIC PANEL - Abnormal; Notable for the following:    Sodium 135 (*)    Potassium 5.8 (*)    Glucose, Bld 106 (*)    GFR calc non Af Amer 73 (*)    GFR calc Af Amer 85 (*)    All other components within normal limits  URINALYSIS, ROUTINE W REFLEX MICROSCOPIC  I-STAT CHEM 8, ED    Imaging Review Ct Chest W Contrast  08/08/2014   CLINICAL DATA:  Mediastinal lymphadenopathy on thoracic spine MRI. History of breast cancer.  EXAM: CT CHEST, ABDOMEN, AND PELVIS WITH CONTRAST  TECHNIQUE: Multidetector CT imaging of the chest, abdomen and pelvis was performed following the standard protocol during bolus administration of intravenous contrast.  CONTRAST:  175mL  OMNIPAQUE IOHEXOL 300 MG/ML  SOLN  COMPARISON:  Thoracic and lumbar spine MRI dated 08/08/2014  FINDINGS: CT CHEST FINDINGS  Mild patchy/ground-glass opacities in the left upper lobe (series 5/image 15) and right upper lobe (for example, series 5/ image 18), suspicious for mild pneumonia.  Mild dependent atelectasis in the bilateral lower lobes. No pleural effusion or pneumothorax.  Visualized thyroid is unremarkable.  Heart is normal in size. No pericardial effusion. Mild atherosclerotic calcifications of the aortic arch.  Thoracic lymphadenopathy, including:  --12 mm short axis right supraclavicular node (series 2/ image 1)  --26 mm short axis high right paratracheal node (series 2/ image 12)  --17 mm short  axis right axillary node  --22 mm short axis partially necrotic subcarinal node (series 2/image 26)  Status post left mastectomy with reconstruction.  Mild superior endplate compression fracture deformity at T10 with associated underlying lytic lesion along the posterior vertebral body (sagittal image 59), better evaluated on MRI. Mild retropulsion.  CT ABDOMEN AND PELVIS FINDINGS  Hepatobiliary: Liver is within normal limits. No suspicious/enhancing hepatic lesions.  Layering gallstones (series 2/image 54), without associated inflammatory changes.  Pancreas: Within normal limits.  Spleen: Within normal limits.  Adrenals/Urinary Tract: Adrenal glands are unremarkable.  Kidneys are within normal limits.  No hydronephrosis.  Bladder is notable for layering excretory MRI contrast.  Stomach/Bowel: Moderate hiatal hernia. Stomach is otherwise within normal limits.  No evidence of bowel obstruction.  Suspected prior appendectomy.  Prior distal colonic resection with anastomosis in the left pelvis (series 2/image 94).  Vascular/Lymphatic: Atherosclerotic calcifications of the abdominal aorta and branch vessels.  No suspicious abdominopelvic lymphadenopathy.  Reproductive: Status post hysterectomy.  No adnexal masses.   Other: No abdominopelvic ascites.  Musculoskeletal: Mild superior endplate compression fracture deformity at L3 with associated lytic lesion along the anterior vertebral body (sagittal image 27). No retropulsion.  Lucencies involving the sacrum likely reflect focal fat when correlating with MRI.  IMPRESSION: Thoracic lymphadenopathy, including right supraclavicular and axillary lymphadenopathy, as above. Primary differential considerations include nonvisualized (right) breast or lung cancer, or possibly lymphoma.  No suspicious lymphadenopathy in the abdomen or pelvis.  Pathologic fractures involving T10 and L3, better evaluated on MRI.   Electronically Signed   By: Julian Hy M.D.   On: 08/08/2014 15:09   Mr Thoracic Spine W Wo Contrast  08/08/2014   CLINICAL DATA:  Progressive low back pain over the last 2 weeks with radiation into the leg and difficulty ambulating. History of breast cancer. Initial encounter.  EXAM: MRI THORACIC AND LUMBAR SPINE WITHOUT AND WITH CONTRAST  TECHNIQUE: Multiplanar and multiecho pulse sequences of the thoracic and lumbar spine were obtained without and with intravenous contrast.  CONTRAST:  78mL MULTIHANCE GADOBENATE DIMEGLUMINE 529 MG/ML IV SOLN  COMPARISON:  Lumbar spine radiographs 08/04/2014.  FINDINGS: MR THORACIC SPINE FINDINGS  Sagittal localizing images demonstrate no definite abnormalities within the cervical spine. There is diffuse replacement of the normal marrow signal within the T10 vertebral body associated with a mild superior endplate compression deformity and mild osseous retropulsion on the right. Post-contrast, there is diffuse enhancement of the T10 vertebral body. Appearance is highly worrisome for metastatic disease with a pathologic fracture. There may be a small amount of enhancing epidural tumor on the right. No cord compression is present.  No other osseous metastases or fractures are seen within the thoracic spine.  The thoracic cord is normal in  signal and caliber. There is no abnormal intradural enhancement.  There is extensive mediastinal lymphadenopathy. There is a large right paratracheal mass measuring 4.5 x 3.5 cm. There is a subcarinal nodal mass measuring 2.4 x 3.5 cm. There is probable lower esophageal adenopathy as well.  There is minimal thoracic spondylosis without disc herniation or foraminal compromise.  MR LUMBAR SPINE FINDINGS  There are 5 lumbar type vertebral bodies. There is superior endplate Schmorl's node at L3. Bone marrow edema and enhancement are present throughout the L3 vertebral body. There is no osseous retropulsion or epidural tumor. Although less concerning than the findings at T10, this likely represents metastatic disease as well.  No other osseous metastases or fractures are seen within the lumbar spine.  The  conus medullaris extends to the L1-2 disc space level. There is no abnormal intradural enhancement. No paraspinal mass is demonstrated.  There is mild lumbar spondylosis. At L2-3 and L3-4, there is mild disc bulging without resulting spinal stenosis or nerve root encroachment. At L4-5, the disc bulging is eccentric laterally to the left but does not causes any L4 nerve root encroachment. Disc degeneration and osteophytes at L5-S1 are asymmetric to the right. There are mild facet degenerative changes throughout the lower lumbar spine.  IMPRESSION: 1. Mediastinal lymphadenopathy highly worrisome for metastatic disease. This pattern is atypical for metastatic breast cancer and is more suggestive of lung cancer or possibly lymphoma. CT (at least of the chest; consider including the abdomen and pelvis) recommended for further evaluation. 2. Fractures at T10 and L3 are both suspicious for pathologic fractures from underlying metastatic disease. There is possibly a small amount of epidural tumor associated with the T10 fracture. No cord compression or abnormal intradural enhancement identified. 3. These results were called by  telephone at the time of interpretation on 08/08/2014 at 12:14 pm to Dr. Joseph Berkshire , who verbally acknowledged these results.   Electronically Signed   By: Camie Patience M.D.   On: 08/08/2014 12:16   Mr Lumbar Spine W Wo Contrast  08/08/2014   CLINICAL DATA:  Progressive low back pain over the last 2 weeks with radiation into the leg and difficulty ambulating. History of breast cancer. Initial encounter.  EXAM: MRI THORACIC AND LUMBAR SPINE WITHOUT AND WITH CONTRAST  TECHNIQUE: Multiplanar and multiecho pulse sequences of the thoracic and lumbar spine were obtained without and with intravenous contrast.  CONTRAST:  56mL MULTIHANCE GADOBENATE DIMEGLUMINE 529 MG/ML IV SOLN  COMPARISON:  Lumbar spine radiographs 08/04/2014.  FINDINGS: MR THORACIC SPINE FINDINGS  Sagittal localizing images demonstrate no definite abnormalities within the cervical spine. There is diffuse replacement of the normal marrow signal within the T10 vertebral body associated with a mild superior endplate compression deformity and mild osseous retropulsion on the right. Post-contrast, there is diffuse enhancement of the T10 vertebral body. Appearance is highly worrisome for metastatic disease with a pathologic fracture. There may be a small amount of enhancing epidural tumor on the right. No cord compression is present.  No other osseous metastases or fractures are seen within the thoracic spine.  The thoracic cord is normal in signal and caliber. There is no abnormal intradural enhancement.  There is extensive mediastinal lymphadenopathy. There is a large right paratracheal mass measuring 4.5 x 3.5 cm. There is a subcarinal nodal mass measuring 2.4 x 3.5 cm. There is probable lower esophageal adenopathy as well.  There is minimal thoracic spondylosis without disc herniation or foraminal compromise.  MR LUMBAR SPINE FINDINGS  There are 5 lumbar type vertebral bodies. There is superior endplate Schmorl's node at L3. Bone marrow edema  and enhancement are present throughout the L3 vertebral body. There is no osseous retropulsion or epidural tumor. Although less concerning than the findings at T10, this likely represents metastatic disease as well.  No other osseous metastases or fractures are seen within the lumbar spine.  The conus medullaris extends to the L1-2 disc space level. There is no abnormal intradural enhancement. No paraspinal mass is demonstrated.  There is mild lumbar spondylosis. At L2-3 and L3-4, there is mild disc bulging without resulting spinal stenosis or nerve root encroachment. At L4-5, the disc bulging is eccentric laterally to the left but does not causes any L4 nerve root encroachment. Disc degeneration and  osteophytes at L5-S1 are asymmetric to the right. There are mild facet degenerative changes throughout the lower lumbar spine.  IMPRESSION: 1. Mediastinal lymphadenopathy highly worrisome for metastatic disease. This pattern is atypical for metastatic breast cancer and is more suggestive of lung cancer or possibly lymphoma. CT (at least of the chest; consider including the abdomen and pelvis) recommended for further evaluation. 2. Fractures at T10 and L3 are both suspicious for pathologic fractures from underlying metastatic disease. There is possibly a small amount of epidural tumor associated with the T10 fracture. No cord compression or abnormal intradural enhancement identified. 3. These results were called by telephone at the time of interpretation on 08/08/2014 at 12:14 pm to Dr. Joseph Berkshire , who verbally acknowledged these results.   Electronically Signed   By: Camie Patience M.D.   On: 08/08/2014 12:16   Ct Abdomen Pelvis W Contrast  08/08/2014   CLINICAL DATA:  Mediastinal lymphadenopathy on thoracic spine MRI. History of breast cancer.  EXAM: CT CHEST, ABDOMEN, AND PELVIS WITH CONTRAST  TECHNIQUE: Multidetector CT imaging of the chest, abdomen and pelvis was performed following the standard protocol  during bolus administration of intravenous contrast.  CONTRAST:  126mL OMNIPAQUE IOHEXOL 300 MG/ML  SOLN  COMPARISON:  Thoracic and lumbar spine MRI dated 08/08/2014  FINDINGS: CT CHEST FINDINGS  Mild patchy/ground-glass opacities in the left upper lobe (series 5/image 15) and right upper lobe (for example, series 5/ image 18), suspicious for mild pneumonia.  Mild dependent atelectasis in the bilateral lower lobes. No pleural effusion or pneumothorax.  Visualized thyroid is unremarkable.  Heart is normal in size. No pericardial effusion. Mild atherosclerotic calcifications of the aortic arch.  Thoracic lymphadenopathy, including:  --12 mm short axis right supraclavicular node (series 2/ image 1)  --26 mm short axis high right paratracheal node (series 2/ image 12)  --17 mm short axis right axillary node  --22 mm short axis partially necrotic subcarinal node (series 2/image 26)  Status post left mastectomy with reconstruction.  Mild superior endplate compression fracture deformity at T10 with associated underlying lytic lesion along the posterior vertebral body (sagittal image 59), better evaluated on MRI. Mild retropulsion.  CT ABDOMEN AND PELVIS FINDINGS  Hepatobiliary: Liver is within normal limits. No suspicious/enhancing hepatic lesions.  Layering gallstones (series 2/image 54), without associated inflammatory changes.  Pancreas: Within normal limits.  Spleen: Within normal limits.  Adrenals/Urinary Tract: Adrenal glands are unremarkable.  Kidneys are within normal limits.  No hydronephrosis.  Bladder is notable for layering excretory MRI contrast.  Stomach/Bowel: Moderate hiatal hernia. Stomach is otherwise within normal limits.  No evidence of bowel obstruction.  Suspected prior appendectomy.  Prior distal colonic resection with anastomosis in the left pelvis (series 2/image 94).  Vascular/Lymphatic: Atherosclerotic calcifications of the abdominal aorta and branch vessels.  No suspicious abdominopelvic  lymphadenopathy.  Reproductive: Status post hysterectomy.  No adnexal masses.  Other: No abdominopelvic ascites.  Musculoskeletal: Mild superior endplate compression fracture deformity at L3 with associated lytic lesion along the anterior vertebral body (sagittal image 27). No retropulsion.  Lucencies involving the sacrum likely reflect focal fat when correlating with MRI.  IMPRESSION: Thoracic lymphadenopathy, including right supraclavicular and axillary lymphadenopathy, as above. Primary differential considerations include nonvisualized (right) breast or lung cancer, or possibly lymphoma.  No suspicious lymphadenopathy in the abdomen or pelvis.  Pathologic fractures involving T10 and L3, better evaluated on MRI.   Electronically Signed   By: Julian Hy M.D.   On: 08/08/2014 15:09  EKG Interpretation None      MDM   Final diagnoses:  Low back pain  Lymphadenopathy, mediastinal   Patient presents to the ER for evaluation of back pain. Patient has been experiencing pain in the low back for 2 weeks. She denied injury. She did not have any neurologic symptoms and her neurologic examination was unremarkable. She was experiencing a significant amount of pain, however. Patient therefore underwent MRI for further evaluation. MRI reveals evidence of T10 and L3 fracture which explain the patient's pain. There was, however, significant lymphadenopathy and his concern for pathologic fracture as well as metastatic cancer. CT scan of chest, abdomen, pelvis also performed. This confirmed lymphadenopathy, but did not show any obvious solid organ tumor or source for metastatic disease. Lymphoma is suggested as a possibility. Patient will require hospitalization for further management of her pain and further workup for possible metastatic disease.     Orpah Greek, MD 08/08/14 [id number]

## 2014-08-08 NOTE — Plan of Care (Signed)
Problem: Phase I Progression Outcomes Goal: Pain controlled with appropriate interventions Outcome: Completed/Met Date Met:  08/08/14     

## 2014-08-09 ENCOUNTER — Encounter (HOSPITAL_COMMUNITY)
Admission: EM | Disposition: A | Discharge status: Home Health | Payer: Self-pay | Source: Home / Self Care | Attending: Internal Medicine

## 2014-08-09 ENCOUNTER — Inpatient Hospital Stay (HOSPITAL_COMMUNITY): Payer: BC Managed Care – PPO | Admitting: Anesthesiology

## 2014-08-09 DIAGNOSIS — M4850XA Collapsed vertebra, not elsewhere classified, site unspecified, initial encounter for fracture: Secondary | ICD-10-CM

## 2014-08-09 HISTORY — PX: LYMPH NODE BIOPSY: SHX201

## 2014-08-09 LAB — SURGICAL PCR SCREEN
MRSA, PCR: NEGATIVE
STAPHYLOCOCCUS AUREUS: NEGATIVE

## 2014-08-09 SURGERY — LYMPH NODE BIOPSY
Anesthesia: General | Site: Axilla | Laterality: Right

## 2014-08-09 MED ORDER — METOPROLOL TARTRATE 1 MG/ML IV SOLN
INTRAVENOUS | Status: AC
  Filled 2014-08-09: qty 5

## 2014-08-09 MED ORDER — EPHEDRINE SULFATE 50 MG/ML IJ SOLN
INTRAMUSCULAR | Status: AC
  Filled 2014-08-09: qty 1

## 2014-08-09 MED ORDER — BUPIVACAINE HCL (PF) 0.25 % IJ SOLN
INTRAMUSCULAR | Status: AC
  Filled 2014-08-09: qty 30

## 2014-08-09 MED ORDER — MIDAZOLAM HCL 2 MG/2ML IJ SOLN
INTRAMUSCULAR | Status: AC
  Filled 2014-08-09: qty 2

## 2014-08-09 MED ORDER — PANTOPRAZOLE SODIUM 40 MG PO TBEC: 40.0000 mg | DELAYED_RELEASE_TABLET | Freq: Every day | ORAL | Status: DC

## 2014-08-09 MED ORDER — FENTANYL CITRATE 0.05 MG/ML IJ SOLN
INTRAMUSCULAR | Status: AC
  Filled 2014-08-09: qty 5

## 2014-08-09 MED ORDER — BUPIVACAINE-EPINEPHRINE 0.25% -1:200000 IJ SOLN: INTRAMUSCULAR | Status: DC | PRN

## 2014-08-09 MED ORDER — ONDANSETRON HCL 4 MG/2ML IJ SOLN
INTRAMUSCULAR | Status: DC | PRN
  Administered 2014-08-09: 4 mg via INTRAVENOUS

## 2014-08-09 MED ORDER — LACTATED RINGERS IV SOLN
INTRAVENOUS | Status: DC | PRN
  Administered 2014-08-09: 09:00:00 via INTRAVENOUS

## 2014-08-09 MED ORDER — HYDROMORPHONE HCL 1 MG/ML IJ SOLN
INTRAMUSCULAR | Status: AC
  Administered 2014-08-09: 1 mg
  Filled 2014-08-09: qty 1

## 2014-08-09 MED ORDER — PROPOFOL 10 MG/ML IV BOLUS
INTRAVENOUS | Status: AC
  Filled 2014-08-09: qty 20

## 2014-08-09 MED ORDER — EPHEDRINE SULFATE 50 MG/ML IJ SOLN
INTRAMUSCULAR | Status: DC | PRN
  Administered 2014-08-09 (×2): 15 mg via INTRAVENOUS

## 2014-08-09 MED ORDER — PHENYLEPHRINE HCL 10 MG/ML IJ SOLN
INTRAMUSCULAR | Status: DC | PRN
  Administered 2014-08-09 (×3): 40 ug via INTRAVENOUS

## 2014-08-09 MED ORDER — OXYCODONE-ACETAMINOPHEN 5-325 MG PO TABS
1.0000 | ORAL_TABLET | ORAL | Status: DC | PRN
  Administered 2014-08-09 – 2014-08-10 (×4): 2 via ORAL
  Filled 2014-08-09 (×4): qty 2

## 2014-08-09 MED ORDER — HYDROMORPHONE HCL 1 MG/ML IJ SOLN
0.5000 mg | INTRAMUSCULAR | Status: DC | PRN
Start: 2014-08-09 — End: 2014-08-10
  Administered 2014-08-09 – 2014-08-10 (×3): 1 mg via INTRAVENOUS
  Filled 2014-08-09 (×4): qty 1

## 2014-08-09 MED ORDER — DEXAMETHASONE SODIUM PHOSPHATE 10 MG/ML IJ SOLN
INTRAMUSCULAR | Status: DC | PRN
  Administered 2014-08-09: 10 mg via INTRAVENOUS

## 2014-08-09 MED ORDER — ONDANSETRON HCL 4 MG/2ML IJ SOLN
INTRAMUSCULAR | Status: AC
  Filled 2014-08-09: qty 2

## 2014-08-09 MED ORDER — PROPOFOL 10 MG/ML IV BOLUS
INTRAVENOUS | Status: DC | PRN
  Administered 2014-08-09: 150 mg via INTRAVENOUS

## 2014-08-09 MED ORDER — PHENYLEPHRINE 40 MCG/ML (10ML) SYRINGE FOR IV PUSH (FOR BLOOD PRESSURE SUPPORT)
PREFILLED_SYRINGE | INTRAVENOUS | Status: AC
  Filled 2014-08-09: qty 10

## 2014-08-09 MED ORDER — PANTOPRAZOLE SODIUM 40 MG PO TBEC
40.0000 mg | DELAYED_RELEASE_TABLET | Freq: Every day | ORAL | Status: DC
  Administered 2014-08-09 – 2014-08-22 (×13): 40 mg via ORAL
  Filled 2014-08-09 (×14): qty 1

## 2014-08-09 MED ORDER — DEXAMETHASONE SODIUM PHOSPHATE 10 MG/ML IJ SOLN
INTRAMUSCULAR | Status: AC
  Filled 2014-08-09: qty 1

## 2014-08-09 MED ORDER — MIDAZOLAM HCL 5 MG/5ML IJ SOLN
INTRAMUSCULAR | Status: DC | PRN
Start: 2014-08-09 — End: 2014-08-09
  Administered 2014-08-09 (×2): 1 mg via INTRAVENOUS

## 2014-08-09 MED ORDER — SODIUM CHLORIDE 0.9 % IJ SOLN
INTRAMUSCULAR | Status: AC
  Filled 2014-08-09: qty 10

## 2014-08-09 MED ORDER — FENTANYL CITRATE 0.05 MG/ML IJ SOLN
INTRAMUSCULAR | Status: DC | PRN
  Administered 2014-08-09: 25 ug via INTRAVENOUS
  Administered 2014-08-09: 50 ug via INTRAVENOUS

## 2014-08-09 MED ORDER — HYDROMORPHONE HCL 1 MG/ML IJ SOLN
0.2500 mg | INTRAMUSCULAR | Status: DC | PRN
  Administered 2014-08-09 (×2): 0.5 mg via INTRAVENOUS

## 2014-08-09 MED ORDER — BUPIVACAINE HCL (PF) 0.25 % IJ SOLN
INTRAMUSCULAR | Status: DC | PRN
  Administered 2014-08-09: 24 mL

## 2014-08-09 SURGICAL SUPPLY — 21 items
ADH SKN CLS APL DERMABOND .7 (GAUZE/BANDAGES/DRESSINGS) ×1
DERMABOND ADVANCED (GAUZE/BANDAGES/DRESSINGS) ×2
DERMABOND ADVANCED .7 DNX12 (GAUZE/BANDAGES/DRESSINGS) IMPLANT
DRAPE LAPAROTOMY TRNSV 102X78 (DRAPE) ×3 IMPLANT
ELECT COATED BLADE 2.86 ST (ELECTRODE) ×2 IMPLANT
GAUZE SPONGE 4X4 16PLY XRAY LF (GAUZE/BANDAGES/DRESSINGS) ×3 IMPLANT
GLOVE BIOGEL PI IND STRL 7.0 (GLOVE) ×1 IMPLANT
GLOVE BIOGEL PI INDICATOR 7.0 (GLOVE) ×2
GLOVE ECLIPSE 8.0 STRL XLNG CF (GLOVE) ×3 IMPLANT
GLOVE INDICATOR 8.0 STRL GRN (GLOVE) ×3 IMPLANT
GOWN SPEC L4 XLG W/TWL (GOWN DISPOSABLE) ×3 IMPLANT
GOWN STRL REUS W/ TWL XL LVL3 (GOWN DISPOSABLE) ×3 IMPLANT
GOWN STRL REUS W/TWL LRG LVL3 (GOWN DISPOSABLE) ×3 IMPLANT
GOWN STRL REUS W/TWL XL LVL3 (GOWN DISPOSABLE) ×9
KIT BASIN OR (CUSTOM PROCEDURE TRAY) ×3 IMPLANT
NEEDLE HYPO 22GX1.5 SAFETY (NEEDLE) ×3 IMPLANT
PACK GENERAL/GYN (CUSTOM PROCEDURE TRAY) ×3 IMPLANT
SUT MNCRL AB 4-0 PS2 18 (SUTURE) ×2 IMPLANT
SUT VIC AB 3-0 SH 8-18 (SUTURE) ×2 IMPLANT
SYR CONTROL 10ML LL (SYRINGE) ×3 IMPLANT
TOWEL OR 17X26 10 PK STRL BLUE (TOWEL DISPOSABLE) ×3 IMPLANT

## 2014-08-09 NOTE — Op Note (Signed)
NAME:  EMLYN, MAVES NO.:  [id number]  MEDICAL RECORD NO.:  [id number]  LOCATION:  [facility]                         FACILITY:  [facility]  PHYSICIAN:  Fenton Malling. Lucia Gaskins, M.D.  DATE OF BIRTH:  17-Sep-1950  DATE OF PROCEDURE:  08/09/2014                              OPERATIVE REPORT   PREOPERATIVE DIAGNOSIS:  Right axillary lymphadenopathy with probable metastatic cancer.  POSTOPERATIVE DIAGNOSIS:  Right axillary lymphadenopathy with probable metastatic cancer.  PROCEDURE:  Right axilla lymph node biopsy.  SURGEON:  Fenton Malling. Lucia Gaskins, M.D.  ANESTHESIA: 1. General endotracheal, supervised by Dr. Nolon Nations. 2. Local anesthetic was 25 mL of 0.25% Marcaine plain.  ESTIMATED BLOOD LOSS:  Less than 50 mL.  DRAINS LEFT IN:  None.  SPECIMEN:  Right axillary lymph node.  INDICATION FOR PROCEDURE:  Ms. Girardot is a 63 year old white female, who sees Dr. Orland Penman, her primary care doctor.  She presented to the [facility] Emergency Room yesterday with about 2 week history of severe back pain.  CT scan evaluation revealed pathologic fractures involving T10 and L3.  She also had mediastinal, supraclavicular, and axillary adenopathy.  She has come to the operating room for an axillary lymph node biopsy for pathologic diagnosis.  Indications, potential complications of procedure were explained to the patient.  Potential complications include but not limited to bleeding, infection, nerve injury.  OPERATIVE NOTE:  The patient was taken to room #6 of [facility]. She was in a supine position under general anesthesia.  Her right breast and axilla were prepped with ChloraPrep and sterilely draped.  A time-out was held and a surgical checklist was run.  I made an incision in the right axilla.  I found a lymph node approximately 2.5 cm to 3 cm in size that was clearly abnormal.  This lymph node was excised in its entirety and sent to Pathology.  I spoke to the pathologist, Vicente Males, by phone regarding the diagnosis and differential.  I then irrigated the wound with about 800 mL of saline.  I used a Bovie electrocautery and 3-0 Vicryl sutures for hemostasis.  I closed the wound in layers with 3-0 Vicryl sutures and the skin with a 4-0 Monocryl suture and painted the wound with LiquiBand.    She tolerated procedure well, was transferred to the recovery room in good condition.  Sponge and needle count were correct in the end of the case.  Final pathology is pending.   Fenton Malling. Lucia Gaskins, M.D.. FACS   DHN/MEDQ  D:  08/09/2014  T:  08/09/2014  Job:  [id number]  cc:   Dr. Marzetta Board  Dr. [name] Nnodi

## 2014-08-09 NOTE — Progress Notes (Addendum)
PROGRESS NOTE  [facility] DVV:[id number] DOB: 03/22/1951 DOA: 08/08/2014 PCP: [name], MD  Summary: 62yow presented with 2 week h/o back pain. MRI revealed T10, L3 fxs as well as significant LAD. CT chest, abdomen, pelvis did not review primary tumor. Admitted for further evaluation of back pain.  Assessment/Plan: 1. Back pain secondary to T10, L3 fxs, presumed pathologic. No neurologic symptoms. Pain much improved with pain medication. Status post biopsy today of lymphadenopathy today. 2. Mediastinal LAD. As above, status post biopsy today. 3. Hyperkalemia, resolved. Etiology unclear. 4. Mild hyponatremia. Likely secondary to hydrochlorothiazide. 5. HTN, stable. 6. Anxiety, depression 7. H/o left breast phylloides tumor. 8. COPD 9. Tobacco dependence.    Symptomatically improved. Plan transition to oral pain medications, minimize use of IV narcotic. No focal neurologic symptoms.   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 Patient without neuro symptoms. Discussed with Dr. [name] radiation oncology, recommends serial neuro checks, will f/u on in the next 1-2 days, but no indication for radiation or steroids at this point. Discussed with Dr. Saintclair Halsted neurosurgery, recommends TSLO brace to stabilize T10, can ambulate ad lib once spine braced. No indication for surgery. Follow-up clinically.  Neuro checks q4 hours. Monitor for development of neurologic symptoms.  Discussed above recommendations with patient  Code Status: full code DVT prophylaxis: SCDs Family Communication: discussed in detail with husband at bedside Disposition Plan: home when improved  [name], MD  [facility]  Pager [phone] If 7PM-7AM, please contact night-coverage at [email or web address], password [facility] 08/09/2014, 6:19 PM  LOS: 1 day   Consultants:  General surgery   Procedures: 11/24 Right axilla lymph  node biopsy.  Antibiotics:    HPI/Subjective: S/p biopsy today.  Feeling better. Pain controlled with Dilaudid. Ambulating to bathroom without difficulty. No numbness, weakness of LE, bowel or bladder dysfunction. No nausea or vomiting. Tolerating diet.  Objective: Filed Vitals:   08/09/14 1220 08/09/14 1327 08/09/14 1438 08/09/14 1530  BP: 123/53 124/66 143/96 117/92  Pulse: 83 88  84  Temp: 98 F (36.7 C) 98 F (36.7 C) 98.2 F (36.8 C) 98.2 F (36.8 C)  TempSrc: Oral Oral Oral Oral  Resp: 16 18 18 18   Height:      Weight:      SpO2: 95% 96% 95% 95%    Intake/Output Summary (Last 24 hours) at 08/09/14 1819 Last data filed at 08/09/14 1800  Gross per 24 hour  Intake 2015.83 ml  Output   1150 ml  Net 865.83 ml     Filed Weights   08/09/14 0446  Weight: 100.9 kg (222 lb 7.1 oz)    Exam:     Afebrile, vital signs stable.  General: Appears calm, comfortable  Psych: Alert. Speech fluent and clear.  CV: Regular rate and rhythm. No murmur, rub or gallop. No lower extremity edema.  Respiratory: Clear to auscultation bilaterally. No wheezes, rales or rhonchi. Normal respiratory effort.  Abdomen: Soft, nontender, nondistended  Skin: Unremarkable  Musculoskeletal: Grossly normal tone bilateral lower extremities. Excellent bilateral lower extremity strength, symmetric. Sensation both legs intact.  Neuro: As above.  Data Reviewed:  Potassium is normalized. BUN and creatinine unremarkable.  Hemoglobin stable.  Urinalysis negative.  Scheduled Meds: . ARIPiprazole  2 mg Oral Daily  . buPROPion  750 mg Oral Daily  . cyclobenzaprine  10 mg Oral QHS  . FLUoxetine  20 mg Oral TID  . irbesartan  75 mg Oral  Daily   And  . hydrochlorothiazide  12.5 mg Oral Daily  . nicotine  14 mg Transdermal Daily  . pantoprazole  40 mg Oral Daily  . simvastatin  40 mg Oral QHS  . tiotropium  18 mcg Inhalation Daily   Continuous Infusions: . sodium chloride 50 mL/hr at  08/09/14 1200    Principal Problem:   Pathological fracture Active Problems:   Lymphadenopathy, mediastinal   HTN (hypertension)   History of lump of left breast   Non-traumatic compression fracture of vertebral column   Time spent 40 minutes, greater than 50% in counseling and coordination of care.

## 2014-08-09 NOTE — Transfer of Care (Signed)
Immediate Anesthesia Transfer of Care Note  Patient: [facility]  Procedure(s) Performed: Procedure(s): Right axillary lymph node biopsy (Right)  Patient Location: PACU  Anesthesia Type:General  Level of Consciousness: awake, alert , oriented and patient cooperative  Airway & Oxygen Therapy: Patient Spontanous Breathing and Patient connected to face mask oxygen  Post-op Assessment: Report given to PACU RN and Post -op Vital signs reviewed and stable  Post vital signs: Reviewed and stable  Complications: No apparent anesthesia complications

## 2014-08-09 NOTE — Progress Notes (Signed)
PT Cancellation Note  Patient Details Name: [facility] MRN: [id number] DOB: [birth year]   Cancelled Treatment:    Reason Eval/Treat Not Completed: Patient at procedure or test/unavailable  Will check back as schedule permits;   [facility] 08/09/2014, 9:19 AM

## 2014-08-09 NOTE — Anesthesia Preprocedure Evaluation (Addendum)
Anesthesia Evaluation  Patient identified by MRN, date of birth, ID band Patient awake    Reviewed: Allergy & Precautions, H&P , NPO status , Patient's Chart, lab work & pertinent test results  Airway Mallampati: II  TM Distance: >3 FB Neck ROM: Full    Dental no notable dental hx.    Pulmonary shortness of breath, COPDCurrent Smoker,  breath sounds clear to auscultation  Pulmonary exam normal       Cardiovascular hypertension, Pt. on medications Rhythm:Regular Rate:Normal     Neuro/Psych PSYCHIATRIC DISORDERS Anxiety Depression negative neurological ROS     GI/Hepatic negative GI ROS, Neg liver ROS,   Endo/Other  negative endocrine ROS  Renal/GU negative Renal ROS     Musculoskeletal negative musculoskeletal ROS (+)   Abdominal   Peds  Hematology negative hematology ROS (+)   Anesthesia Other Findings   Reproductive/Obstetrics negative OB ROS                            Anesthesia Physical Anesthesia Plan  ASA: III  Anesthesia Plan: General   Post-op Pain Management:    Induction: Intravenous  Airway Management Planned: LMA  Additional Equipment:   Intra-op Plan:   Post-operative Plan: Extubation in OR  Informed Consent: I have reviewed the patients History and Physical, chart, labs and discussed the procedure including the risks, benefits and alternatives for the proposed anesthesia with the patient or authorized representative who has indicated his/her understanding and acceptance.   Dental advisory given  Plan Discussed with: CRNA  Anesthesia Plan Comments:        Anesthesia Quick Evaluation

## 2014-08-09 NOTE — Anesthesia Postprocedure Evaluation (Signed)
Anesthesia Post Note  Patient: [facility]  Procedure(s) Performed: Procedure(s) (LRB): Right axillary lymph node biopsy (Right)  Anesthesia type: General  Patient location: PACU  Post pain: Pain level controlled  Post assessment: Post-op Vital signs reviewed  Last Vitals: BP 124/66 mmHg  Pulse 88  Temp(Src) 36.7 C (Oral)  Resp 18  Ht 5\' 5"  (1.651 m)  Wt 222 lb 7.1 oz (100.9 kg)  BMI 37.02 kg/m2  SpO2 96%  Post vital signs: Reviewed  Level of consciousness: sedated  Complications: No apparent anesthesia complications

## 2014-08-10 DIAGNOSIS — R591 Generalized enlarged lymph nodes: Secondary | ICD-10-CM

## 2014-08-10 MED ORDER — OXYCODONE HCL 5 MG PO TABS
15.0000 mg | ORAL_TABLET | ORAL | Status: DC | PRN
  Administered 2014-08-10 – 2014-08-17 (×22): 15 mg via ORAL
  Filled 2014-08-10 (×24): qty 3

## 2014-08-10 MED ORDER — HYDROMORPHONE HCL 1 MG/ML IJ SOLN: 0.5000 mg | INTRAMUSCULAR | Status: DC | PRN

## 2014-08-10 MED ORDER — HYDROMORPHONE HCL 1 MG/ML IJ SOLN
0.5000 mg | INTRAMUSCULAR | Status: DC | PRN
  Administered 2014-08-10: 0.5 mg via INTRAVENOUS
  Administered 2014-08-10 – 2014-08-11 (×4): 1 mg via INTRAVENOUS
  Administered 2014-08-11: 0.5 mg via INTRAVENOUS
  Administered 2014-08-11 – 2014-08-13 (×6): 1 mg via INTRAVENOUS
  Filled 2014-08-10 (×12): qty 1

## 2014-08-10 NOTE — Progress Notes (Signed)
1 Day Post-Op R axillary lymph node biopsy Subjective: Doing fine.  No complaints  Objective: Vital signs in last 24 hours: Temp:  [97.6 F (36.4 C)-98.6 F (37 C)] 97.6 F (36.4 C) (11/26 0539) Pulse Rate:  [72-93] 72 (11/26 0539) Resp:  [13-19] 18 (11/26 0539) BP: (111-143)/(53-96) 121/59 mmHg (11/26 0539) SpO2:  [92 %-100 %] 98 % (11/26 0539)   Intake/Output from previous day: 11/25 0701 - 11/26 0700 In: 1850 [P.O.:400; I.V.:1450] Out: 2150 [Urine:2150] Intake/Output this shift:     General appearance: alert and cooperative Extremities: edema no RUE edema  Incision: no significant drainage, no significant erythema, ecchymosis present  Lab Results:   Recent Labs  08/08/14 0951 08/08/14 1540  WBC 15.0*  --   HGB 14.1 13.9  HCT 41.9 41.0  PLT 335  --    BMET  Recent Labs  08/08/14 0951 08/08/14 1540  NA 135* 133*  K 5.8* 4.2  CL 97 100  CO2 23  --   GLUCOSE 106* 122*  BUN 18 18  CREATININE 0.85 0.80  CALCIUM 9.6  --    PT/INR No results for input(s): LABPROT, INR in the last 72 hours. ABG No results for input(s): PHART, HCO3 in the last 72 hours.  Invalid input(s): PCO2, PO2  MEDS, Scheduled . ARIPiprazole  2 mg Oral Daily  . buPROPion  750 mg Oral Daily  . cyclobenzaprine  10 mg Oral QHS  . FLUoxetine  20 mg Oral TID  . irbesartan  75 mg Oral Daily   And  . hydrochlorothiazide  12.5 mg Oral Daily  . nicotine  14 mg Transdermal Daily  . pantoprazole  40 mg Oral Q2000  . simvastatin  40 mg Oral QHS  . tiotropium  18 mcg Inhalation Daily    Studies/Results: Ct Chest W Contrast  08/08/2014   CLINICAL DATA:  Mediastinal lymphadenopathy on thoracic spine MRI. History of breast cancer.  EXAM: CT CHEST, ABDOMEN, AND PELVIS WITH CONTRAST  TECHNIQUE: Multidetector CT imaging of the chest, abdomen and pelvis was performed following the standard protocol during bolus administration of intravenous contrast.  CONTRAST:  138mL OMNIPAQUE IOHEXOL 300  MG/ML  SOLN  COMPARISON:  Thoracic and lumbar spine MRI dated 08/08/2014  FINDINGS: CT CHEST FINDINGS  Mild patchy/ground-glass opacities in the left upper lobe (series 5/image 15) and right upper lobe (for example, series 5/ image 18), suspicious for mild pneumonia.  Mild dependent atelectasis in the bilateral lower lobes. No pleural effusion or pneumothorax.  Visualized thyroid is unremarkable.  Heart is normal in size. No pericardial effusion. Mild atherosclerotic calcifications of the aortic arch.  Thoracic lymphadenopathy, including:  --12 mm short axis right supraclavicular node (series 2/ image 1)  --26 mm short axis high right paratracheal node (series 2/ image 12)  --17 mm short axis right axillary node  --22 mm short axis partially necrotic subcarinal node (series 2/image 26)  Status post left mastectomy with reconstruction.  Mild superior endplate compression fracture deformity at T10 with associated underlying lytic lesion along the posterior vertebral body (sagittal image 59), better evaluated on MRI. Mild retropulsion.  CT ABDOMEN AND PELVIS FINDINGS  Hepatobiliary: Liver is within normal limits. No suspicious/enhancing hepatic lesions.  Layering gallstones (series 2/image 54), without associated inflammatory changes.  Pancreas: Within normal limits.  Spleen: Within normal limits.  Adrenals/Urinary Tract: Adrenal glands are unremarkable.  Kidneys are within normal limits.  No hydronephrosis.  Bladder is notable for layering excretory MRI contrast.  Stomach/Bowel: Moderate hiatal hernia.  Stomach is otherwise within normal limits.  No evidence of bowel obstruction.  Suspected prior appendectomy.  Prior distal colonic resection with anastomosis in the left pelvis (series 2/image 94).  Vascular/Lymphatic: Atherosclerotic calcifications of the abdominal aorta and branch vessels.  No suspicious abdominopelvic lymphadenopathy.  Reproductive: Status post hysterectomy.  No adnexal masses.  Other: No  abdominopelvic ascites.  Musculoskeletal: Mild superior endplate compression fracture deformity at L3 with associated lytic lesion along the anterior vertebral body (sagittal image 27). No retropulsion.  Lucencies involving the sacrum likely reflect focal fat when correlating with MRI.  IMPRESSION: Thoracic lymphadenopathy, including right supraclavicular and axillary lymphadenopathy, as above. Primary differential considerations include nonvisualized (right) breast or lung cancer, or possibly lymphoma.  No suspicious lymphadenopathy in the abdomen or pelvis.  Pathologic fractures involving T10 and L3, better evaluated on MRI.   Electronically Signed   By: Julian Hy M.D.   On: 08/08/2014 15:09   Mr Thoracic Spine W Wo Contrast  08/08/2014   CLINICAL DATA:  Progressive low back pain over the last 2 weeks with radiation into the leg and difficulty ambulating. History of breast cancer. Initial encounter.  EXAM: MRI THORACIC AND LUMBAR SPINE WITHOUT AND WITH CONTRAST  TECHNIQUE: Multiplanar and multiecho pulse sequences of the thoracic and lumbar spine were obtained without and with intravenous contrast.  CONTRAST:  23mL MULTIHANCE GADOBENATE DIMEGLUMINE 529 MG/ML IV SOLN  COMPARISON:  Lumbar spine radiographs 08/04/2014.  FINDINGS: MR THORACIC SPINE FINDINGS  Sagittal localizing images demonstrate no definite abnormalities within the cervical spine. There is diffuse replacement of the normal marrow signal within the T10 vertebral body associated with a mild superior endplate compression deformity and mild osseous retropulsion on the right. Post-contrast, there is diffuse enhancement of the T10 vertebral body. Appearance is highly worrisome for metastatic disease with a pathologic fracture. There may be a small amount of enhancing epidural tumor on the right. No cord compression is present.  No other osseous metastases or fractures are seen within the thoracic spine.  The thoracic cord is normal in signal  and caliber. There is no abnormal intradural enhancement.  There is extensive mediastinal lymphadenopathy. There is a large right paratracheal mass measuring 4.5 x 3.5 cm. There is a subcarinal nodal mass measuring 2.4 x 3.5 cm. There is probable lower esophageal adenopathy as well.  There is minimal thoracic spondylosis without disc herniation or foraminal compromise.  MR LUMBAR SPINE FINDINGS  There are 5 lumbar type vertebral bodies. There is superior endplate Schmorl's node at L3. Bone marrow edema and enhancement are present throughout the L3 vertebral body. There is no osseous retropulsion or epidural tumor. Although less concerning than the findings at T10, this likely represents metastatic disease as well.  No other osseous metastases or fractures are seen within the lumbar spine.  The conus medullaris extends to the L1-2 disc space level. There is no abnormal intradural enhancement. No paraspinal mass is demonstrated.  There is mild lumbar spondylosis. At L2-3 and L3-4, there is mild disc bulging without resulting spinal stenosis or nerve root encroachment. At L4-5, the disc bulging is eccentric laterally to the left but does not causes any L4 nerve root encroachment. Disc degeneration and osteophytes at L5-S1 are asymmetric to the right. There are mild facet degenerative changes throughout the lower lumbar spine.  IMPRESSION: 1. Mediastinal lymphadenopathy highly worrisome for metastatic disease. This pattern is atypical for metastatic breast cancer and is more suggestive of lung cancer or possibly lymphoma. CT (at least of the  chest; consider including the abdomen and pelvis) recommended for further evaluation. 2. Fractures at T10 and L3 are both suspicious for pathologic fractures from underlying metastatic disease. There is possibly a small amount of epidural tumor associated with the T10 fracture. No cord compression or abnormal intradural enhancement identified. 3. These results were called by  telephone at the time of interpretation on 08/08/2014 at 12:14 pm to Dr. Joseph Berkshire , who verbally acknowledged these results.   Electronically Signed   By: Camie Patience M.D.   On: 08/08/2014 12:16   Mr Lumbar Spine W Wo Contrast  08/08/2014   CLINICAL DATA:  Progressive low back pain over the last 2 weeks with radiation into the leg and difficulty ambulating. History of breast cancer. Initial encounter.  EXAM: MRI THORACIC AND LUMBAR SPINE WITHOUT AND WITH CONTRAST  TECHNIQUE: Multiplanar and multiecho pulse sequences of the thoracic and lumbar spine were obtained without and with intravenous contrast.  CONTRAST:  29mL MULTIHANCE GADOBENATE DIMEGLUMINE 529 MG/ML IV SOLN  COMPARISON:  Lumbar spine radiographs 08/04/2014.  FINDINGS: MR THORACIC SPINE FINDINGS  Sagittal localizing images demonstrate no definite abnormalities within the cervical spine. There is diffuse replacement of the normal marrow signal within the T10 vertebral body associated with a mild superior endplate compression deformity and mild osseous retropulsion on the right. Post-contrast, there is diffuse enhancement of the T10 vertebral body. Appearance is highly worrisome for metastatic disease with a pathologic fracture. There may be a small amount of enhancing epidural tumor on the right. No cord compression is present.  No other osseous metastases or fractures are seen within the thoracic spine.  The thoracic cord is normal in signal and caliber. There is no abnormal intradural enhancement.  There is extensive mediastinal lymphadenopathy. There is a large right paratracheal mass measuring 4.5 x 3.5 cm. There is a subcarinal nodal mass measuring 2.4 x 3.5 cm. There is probable lower esophageal adenopathy as well.  There is minimal thoracic spondylosis without disc herniation or foraminal compromise.  MR LUMBAR SPINE FINDINGS  There are 5 lumbar type vertebral bodies. There is superior endplate Schmorl's node at L3. Bone marrow edema  and enhancement are present throughout the L3 vertebral body. There is no osseous retropulsion or epidural tumor. Although less concerning than the findings at T10, this likely represents metastatic disease as well.  No other osseous metastases or fractures are seen within the lumbar spine.  The conus medullaris extends to the L1-2 disc space level. There is no abnormal intradural enhancement. No paraspinal mass is demonstrated.  There is mild lumbar spondylosis. At L2-3 and L3-4, there is mild disc bulging without resulting spinal stenosis or nerve root encroachment. At L4-5, the disc bulging is eccentric laterally to the left but does not causes any L4 nerve root encroachment. Disc degeneration and osteophytes at L5-S1 are asymmetric to the right. There are mild facet degenerative changes throughout the lower lumbar spine.  IMPRESSION: 1. Mediastinal lymphadenopathy highly worrisome for metastatic disease. This pattern is atypical for metastatic breast cancer and is more suggestive of lung cancer or possibly lymphoma. CT (at least of the chest; consider including the abdomen and pelvis) recommended for further evaluation. 2. Fractures at T10 and L3 are both suspicious for pathologic fractures from underlying metastatic disease. There is possibly a small amount of epidural tumor associated with the T10 fracture. No cord compression or abnormal intradural enhancement identified. 3. These results were called by telephone at the time of interpretation on 08/08/2014 at 12:14 pm  to Dr. Joseph Berkshire , who verbally acknowledged these results.   Electronically Signed   By: Camie Patience M.D.   On: 08/08/2014 12:16   Ct Abdomen Pelvis W Contrast  08/08/2014   CLINICAL DATA:  Mediastinal lymphadenopathy on thoracic spine MRI. History of breast cancer.  EXAM: CT CHEST, ABDOMEN, AND PELVIS WITH CONTRAST  TECHNIQUE: Multidetector CT imaging of the chest, abdomen and pelvis was performed following the standard protocol  during bolus administration of intravenous contrast.  CONTRAST:  156mL OMNIPAQUE IOHEXOL 300 MG/ML  SOLN  COMPARISON:  Thoracic and lumbar spine MRI dated 08/08/2014  FINDINGS: CT CHEST FINDINGS  Mild patchy/ground-glass opacities in the left upper lobe (series 5/image 15) and right upper lobe (for example, series 5/ image 18), suspicious for mild pneumonia.  Mild dependent atelectasis in the bilateral lower lobes. No pleural effusion or pneumothorax.  Visualized thyroid is unremarkable.  Heart is normal in size. No pericardial effusion. Mild atherosclerotic calcifications of the aortic arch.  Thoracic lymphadenopathy, including:  --12 mm short axis right supraclavicular node (series 2/ image 1)  --26 mm short axis high right paratracheal node (series 2/ image 12)  --17 mm short axis right axillary node  --22 mm short axis partially necrotic subcarinal node (series 2/image 26)  Status post left mastectomy with reconstruction.  Mild superior endplate compression fracture deformity at T10 with associated underlying lytic lesion along the posterior vertebral body (sagittal image 59), better evaluated on MRI. Mild retropulsion.  CT ABDOMEN AND PELVIS FINDINGS  Hepatobiliary: Liver is within normal limits. No suspicious/enhancing hepatic lesions.  Layering gallstones (series 2/image 54), without associated inflammatory changes.  Pancreas: Within normal limits.  Spleen: Within normal limits.  Adrenals/Urinary Tract: Adrenal glands are unremarkable.  Kidneys are within normal limits.  No hydronephrosis.  Bladder is notable for layering excretory MRI contrast.  Stomach/Bowel: Moderate hiatal hernia. Stomach is otherwise within normal limits.  No evidence of bowel obstruction.  Suspected prior appendectomy.  Prior distal colonic resection with anastomosis in the left pelvis (series 2/image 94).  Vascular/Lymphatic: Atherosclerotic calcifications of the abdominal aorta and branch vessels.  No suspicious abdominopelvic  lymphadenopathy.  Reproductive: Status post hysterectomy.  No adnexal masses.  Other: No abdominopelvic ascites.  Musculoskeletal: Mild superior endplate compression fracture deformity at L3 with associated lytic lesion along the anterior vertebral body (sagittal image 27). No retropulsion.  Lucencies involving the sacrum likely reflect focal fat when correlating with MRI.  IMPRESSION: Thoracic lymphadenopathy, including right supraclavicular and axillary lymphadenopathy, as above. Primary differential considerations include nonvisualized (right) breast or lung cancer, or possibly lymphoma.  No suspicious lymphadenopathy in the abdomen or pelvis.  Pathologic fractures involving T10 and L3, better evaluated on MRI.   Electronically Signed   By: Julian Hy M.D.   On: 08/08/2014 15:09    Assessment: s/p Procedure(s): Right axillary lymph node biopsy Patient Active Problem List   Diagnosis Date Noted  . Non-traumatic compression fracture of vertebral column 08/09/2014  . Pathological fracture 08/08/2014  . Lymphadenopathy, mediastinal 08/08/2014  . HTN (hypertension) 08/08/2014  . History of lump of left breast 08/08/2014    Plan: pt doing well after biopsy.  Path pending.   Ok to shower Will sign off.  Please call with any questions or concerns     LOS: 2 days     .Rosario Adie, MD [facility] [place]   08/10/2014 8:03 AM

## 2014-08-10 NOTE — Evaluation (Signed)
Physical Therapy Evaluation Patient Details Name: Emily [facility] MRN: [id number] DOB: 01-Sep-1951 Today's Date: 08/10/2014   History of Present Illness  Pt s/p 2 weeks increasing back pain with pathologic fxs at T10 and L3  Clinical Impression  Pt very motivated and mobilizing at min/mod assist level including ambulation with RW.  Pt should progress to d/c home with family assist and no immediate PT follow up needs.  Pt would benefit from OT intervention in hospital and with RW and 3n1 for home use.    Follow Up Recommendations No PT follow up    Equipment Recommendations  Rolling walker with 5" wheels;3in1 (PT)    Recommendations for Other Services OT consult     Precautions / Restrictions Precautions Required Braces or Orthoses: Spinal Brace Spinal Brace: Thoracolumbosacral orthotic Restrictions Weight Bearing Restrictions: No      Mobility  Bed Mobility Overal bed mobility: Needs Assistance Bed Mobility: Supine to Sit     Supine to sit: Min assist;Mod assist     General bed mobility comments: Assisted to EOB by nursing  Transfers Overall transfer level: Needs assistance Equipment used: Rolling walker (2 wheeled) Transfers: Sit to/from Stand Sit to Stand: Min assist         General transfer comment: cues for transition position and use of UEs to self assist  Ambulation/Gait Ambulation/Gait assistance: Min assist Ambulation Distance (Feet): 444 Feet Assistive device: Rolling walker (2 wheeled) Gait Pattern/deviations: Step-through pattern;Decreased step length - right;Decreased step length - left;Shuffle Gait velocity: mod pace   General Gait Details: cues for position from [employer]            Wheelchair Mobility    Modified Rankin (Stroke Patients Only)       Balance                                             Pertinent Vitals/Pain Pain Assessment: 0-10 Pain Score: 5  Pain Location: back Pain Descriptors /  Indicators: Aching Pain Intervention(s): Limited activity within patient's tolerance;Monitored during session;Premedicated before session    Home Living Family/patient expects to be discharged to:: Private residence Living Arrangements: Spouse/significant other Available Help at Discharge: Family Type of Home: House Home Access: Stairs to enter   Technical brewer of Steps: 1 Home Layout: One level Home Equipment: None      Prior Function Level of Independence: Independent               Hand Dominance        Extremity/Trunk Assessment   Upper Extremity Assessment: Overall WFL for tasks assessed           Lower Extremity Assessment: Overall WFL for tasks assessed         Communication   Communication: No difficulties  Cognition Arousal/Alertness: Awake/alert Behavior During Therapy: WFL for tasks assessed/performed Overall Cognitive Status: Within Functional Limits for tasks assessed                      General Comments      Exercises        Assessment/Plan    PT Assessment Patient needs continued PT services  PT Diagnosis Difficulty walking   PT Problem List Decreased balance;Decreased mobility;Decreased knowledge of use of DME;Decreased knowledge of precautions;Pain  PT Treatment Interventions DME instruction;Gait training;Stair training;Functional mobility training;Therapeutic activities;Patient/family education   PT  Goals (Current goals can be found in the Care Plan section) Acute Rehab PT Goals Patient Stated Goal: Home with husband PT Goal Formulation: With patient Time For Goal Achievement: 08/24/14 Potential to Achieve Goals: Good    Frequency Min 5X/week   Barriers to discharge        Co-evaluation               End of Session   Activity Tolerance: Patient tolerated treatment well Patient left: in chair;with call bell/phone within reach Nurse Communication: Mobility status         Time: 1120-1145 PT  Time Calculation (min) (ACUTE ONLY): 25 min   Charges:   PT Evaluation $Initial PT Evaluation Tier I: 1 Procedure PT Treatments $Gait Training: 8-22 mins   PT G Codes:          Henrik Orihuela 08/10/2014, 12:40 PM

## 2014-08-10 NOTE — Progress Notes (Signed)
PROGRESS NOTE  Emily [facility] BPZ:[id number] DOB: March 04, 1951 DOA: 08/08/2014 PCP: [name], MD  Summary: 62yow presented with 2 week h/o back pain. MRI revealed T10, L3 fxs as well as significant LAD. CT chest, abdomen, pelvis did not review primary tumor. Admitted for further evaluation of back pain.  Assessment/Plan: 1. Back pain secondary to T10, L3 fxs, presumed pathologic. No neurologic symptoms. Pain poorly controlled with oral medication. 2. Mediastinal LAD. Status post lymph node biopsy 11/25. 3. Hyperkalemia, resolved. Etiology unclear. 4. Mild hyponatremia. Likely secondary to hydrochlorothiazide. 5. HTN, remains stable. 6. Anxiety, depression 7. H/o left breast phylloides tumor. 8. COPD 9. Tobacco dependence.    Overall remains clinically stable. She has no signs of neurologic dysfunction. She is wearing back brace.  Increase oral pain medication, use IV pain medication for breakthrough. Discussed rationale outpatient.  We discussed neurosurgery and radiation oncology recommendations in detail. We discussed MRI findings of possible epidural tumor in detail. We discussed the importance of monitoring for neurologic symptoms including bowel, bladder dysfunction, numbness or weakness.  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 Patient without neuro symptoms. Discussed with Dr. [name] radiation oncology, recommended serial neuro checks, will f/u on in the next 1-2 days, but no indication for radiation or steroids at this point. Discussed with Dr. Saintclair Halsted neurosurgery, recommended TSLO brace to stabilize T10, can ambulate ad lib once spine braced. No indication for surgery. Follow-up clinically.  Neuro checks q4 hours. Monitor for development of neurologic symptoms.  Discussed in detail with patient and husband at bedside.  Code Status: full code DVT prophylaxis: SCDs Family Communication:    Disposition Plan: home when improved  [name], MD  [facility]  Pager [phone] If 7PM-7AM, please contact night-coverage at [email or web address], password [facility] 08/10/2014, 12:08 PM  LOS: 2 days   Consultants:  General surgery   Procedures: 11/24 Right axilla lymph node biopsy.  Antibiotics:    HPI/Subjective: Feeling better today. Back pain controlled with IV pain medication. Oral medication has not been effective. She was able to ambulate to the desk and back with back brace in place.  No neurologic symptoms. No lower extremity weakness, numbness, sacral dysesthesia, bowel or bladder dysfunction.  Objective: Filed Vitals:   08/09/14 2106 08/10/14 0155 08/10/14 0539 08/10/14 0900  BP: 111/53 116/55 121/59 100/64  Pulse: 82 72 72 72  Temp: 98.4 F (36.9 C) 97.7 F (36.5 C) 97.6 F (36.4 C)   TempSrc: Oral Oral Oral   Resp: 16 16 18    Height:      Weight:      SpO2: 94% 92% 98%     Intake/Output Summary (Last 24 hours) at 08/10/14 1208 Last data filed at 08/10/14 1000  Gross per 24 hour  Intake   1240 ml  Output   1800 ml  Net   -560 ml     Filed Weights   08/09/14 0446  Weight: 100.9 kg (222 lb 7.1 oz)    Exam:     Afebrile, vital signs stable. No hypoxia.  Psychiatric. Alert. Speech fluent and clear.  Cardiovascular regular rate and rhythm. No murmur, rub or gallop.   Respiratory clear to auscultation bilaterally. No wheezes, rales or rhonchi. Normal respiratory effort.  Gen. Appears calm and comfortable.  Muscular skeletal. Excellent bilateral lower extremity strength. Sensation grossly intact.  Data Reviewed:  Urine output 2150  Scheduled Meds: . ARIPiprazole  2 mg Oral Daily  .  buPROPion  750 mg Oral Daily  . cyclobenzaprine  10 mg Oral QHS  . FLUoxetine  20 mg Oral TID  . irbesartan  75 mg Oral Daily   And  . hydrochlorothiazide  12.5 mg Oral Daily  . nicotine  14 mg Transdermal Daily  . pantoprazole  40 mg Oral Q2000  .  simvastatin  40 mg Oral QHS  . tiotropium  18 mcg Inhalation Daily   Continuous Infusions:    Principal Problem:   Pathological fracture Active Problems:   Lymphadenopathy, mediastinal   HTN (hypertension)   History of lump of left breast   Non-traumatic compression fracture of vertebral column   Time spent 35 minutes, greater than 50% in counseling and coordination of care.

## 2014-08-10 NOTE — Plan of Care (Signed)
Problem: Phase II Progression Outcomes Goal: IV changed to normal saline lock Outcome: Completed/Met Date Met:  08/10/14 Goal: Obtain order to discontinue catheter if appropriate Outcome: Not Applicable Date Met:  79/03/83

## 2014-08-11 ENCOUNTER — Encounter (HOSPITAL_COMMUNITY): Payer: Self-pay | Admitting: Surgery

## 2014-08-11 MED ORDER — OXYCODONE HCL ER 15 MG PO T12A
15.0000 mg | EXTENDED_RELEASE_TABLET | Freq: Two times a day (BID) | ORAL | Status: DC
  Administered 2014-08-11 – 2014-08-12 (×2): 15 mg via ORAL
  Filled 2014-08-11 (×2): qty 1

## 2014-08-11 MED ORDER — POLYETHYLENE GLYCOL 3350 17 G PO PACK
17.0000 g | PACK | Freq: Two times a day (BID) | ORAL | Status: DC
  Administered 2014-08-11 – 2014-08-18 (×11): 17 g via ORAL
  Filled 2014-08-11 (×18): qty 1

## 2014-08-11 MED ORDER — SENNA 8.6 MG PO TABS
1.0000 | ORAL_TABLET | Freq: Every day | ORAL | Status: DC
  Administered 2014-08-12 – 2014-08-18 (×6): 8.6 mg via ORAL
  Filled 2014-08-11 (×6): qty 1

## 2014-08-11 NOTE — Evaluation (Signed)
Occupational Therapy Evaluation Patient Details Name: Emily [facility] MRN: [id number] DOB: July 03, 1951 Today's Date: 08/11/2014    History of Present Illness Pt s/p 2 weeks increasing back pain with pathologic fxs at T10 and L3   Clinical Impression   This 63 year old female was admitted for the above.  She recently had R axillary lymph node biopsy and is waiting for results.  Pt will benefit from skilled OT to increase safety and independence with adls.  Evaluation was limited due to pain:  Goals in acute are for supervision to min A level for adls and mod A for tub bench transfer.    Follow Up Recommendations  Supervision/Assistance - 24 hour;Home health OT    Equipment Recommendations  3 in 1 bedside comode;Tub/shower bench (as long as showering OK by MD)    Recommendations for Other Services       Precautions / Restrictions Precautions Required Braces or Orthoses: Spinal Brace Spinal Brace: Thoracolumbosacral orthotic--donned in supine Restrictions Weight Bearing Restrictions: No      Mobility Bed Mobility               General bed mobility comments: not tested  Transfers                 General transfer comment: not tested:  min A with PT yesterday    Balance                                            ADL Overall ADL's : Needs assistance/impaired                                       General ADL Comments: Pt was up in chair earlier today.  Assessed for ADLs at bed level and educated on AE.  Pt currently needs set up for UB adls except min A for UB dressing.  Pt needs max A for LB bathing and total for LB dressing. RN states that pt needs reinforcement with log rolling:  pt was in too much pain at this time.       Vision                     Perception     Praxis      Pertinent Vitals/Pain Pain Score: 7  Pain Location: back Pain Descriptors / Indicators: Sore Pain Intervention(s): Limited  activity within patient's tolerance     Hand Dominance     Extremity/Trunk Assessment Upper Extremity Assessment Upper Extremity Assessment: RUE deficits/detail RUE Deficits / Details: s/p recent lymph node biopsy:  did not fully assess           Communication Communication Communication: No difficulties   Cognition Arousal/Alertness: Awake/alert Behavior During Therapy: WFL for tasks assessed/performed Overall Cognitive Status: Within Functional Limits for tasks assessed                     General Comments       Exercises       Shoulder Instructions      Home Living Family/patient expects to be discharged to:: Private residence Living Arrangements: Spouse/significant other Available Help at Discharge: Family Type of Home: House Home Access: Stairs to enter [employer] of Steps: 1   Home Layout: One  level     Bathroom Shower/Tub: Tub/shower unit Shower/tub characteristics: Scientist, water quality: None   Additional Comments: pt states she does not have 24/7      Prior Functioning/Environment Level of Independence: Independent             OT Diagnosis: Acute pain;Generalized weakness   OT Problem List: Decreased strength;Decreased activity tolerance;Pain;Decreased knowledge of use of DME or AE;Decreased knowledge of precautions   OT Treatment/Interventions: Self-care/ADL training;DME and/or AE instruction;Patient/family education (balance tested)    OT Goals(Current goals can be found in the care plan section) Acute Rehab OT Goals Patient Stated Goal: Home with husband OT Goal Formulation: With patient Time For Goal Achievement: 08/25/14 Potential to Achieve Goals: Good ADL Goals Pt Will Perform Lower Body Bathing: with min assist;with adaptive equipment;sit to/from stand Pt Will Perform Lower Body Dressing: with min assist;with adaptive equipment;sit to/from stand Pt Will Transfer to Toilet: with  min assist;ambulating;bedside commode Pt Will Perform Toileting - Clothing Manipulation and hygiene: sit to/from stand;with min guard assist Pt Will Perform Tub/Shower Transfer: Tub transfer;tub bench;with mod assist;ambulating Additional ADL Goal #1: pt will log roll with supervision and apply TLSO Additional ADL Goal #2: Pt will get in and out of bed via siderolling at supervision level  OT Frequency: Min 2X/week   Barriers to D/C:            Co-evaluation              End of Session    Activity Tolerance: Patient limited by pain Patient left: in bed;with call bell/phone within reach   Time: 4656-8127 OT Time Calculation (min): 24 min Charges:  OT General Charges $OT Visit: 1 Procedure OT Evaluation $Initial OT Evaluation Tier I: 1 Procedure OT Treatments $Self Care/Home Management : 8-22 mins G-Codes:    Mervin Ramires 08/13/14, 12:36 PM  [name], OTR/L [phone] 2014-08-13

## 2014-08-11 NOTE — Consult Note (Signed)
Radiation Oncology         [phone]) [phone] ________________________________  Initial outpatient Consultation  Name: Emily West MRN: [id number]  Date: 08/08/2014  DOB: 03-31-1951  EU:MPNTI, [name], MD  No ref. provider found   REFERRING PHYSICIAN: No ref. 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 female who is severe back pain worsening for the past 2 weeks. She has a history of low grade chronic back pain for years however in the past 2 weeks her pain has been excruciating and is barely able to move.  In the ED patient underwent an MRI of the T/L spine which showed mediastinal lymphadenopathy worrisome for metastatic disease as well as T10 and L3 pathologic fractures.      She then underwent a CT chest/abd/pelvis w/ contrast which showed again extensive lymphadenopathy.     She underwent right axillary lymph node excisional biopsy with Dr. Alphonsa Overall on 08/08/14, biopsy pending.  PREVIOUS RADIATION THERAPY: No  PAST MEDICAL HISTORY:  has a past medical history of History of suicidal ideation; HTN (hypertension); HLD (hyperlipidemia); Shortness of breath; Anxiety and depression; Menopause; Benign hematuria; Impaired fasting glucose; and Other abnormal glucose.    PAST SURGICAL HISTORY: Past Surgical History  Procedure Laterality Date  . Colonoscopy  11/21/2003     rare early left-sided diverticula remaining, low sigmoid anastomosis, internal and external hemorrhoids,   . Cystoscopy    . Lymph node biopsy Right 08/09/2014    Procedure: Right axillary lymph node biopsy;  Surgeon: Alphonsa Overall, MD;  Location: [facility];  Service: General;  Laterality: Right;    FAMILY HISTORY: family history includes Breast cancer in her mother; CVA in her mother; CVA (age of onset: 88) in her brother;  Colon cancer in her maternal grandmother; Depression in her mother; Emphysema in her father; Glaucoma in her mother; Goiter in her sister; Thyroid disease in her mother and sister.  SOCIAL HISTORY:  reports that she has been smoking.  She started smoking about 36 years ago. She does not have any smokeless tobacco history on file. She reports that she drinks alcohol. She reports that she does not use illicit drugs.  ALLERGIES: Review of patient's allergies indicates no known allergies.  MEDICATIONS:  Current Facility-Administered Medications  Medication Dose Route Frequency Provider Last Rate Last Dose  . ARIPiprazole (ABILIFY) tablet 2 mg  2 mg Oral Daily Costin Karlyne Greenspan, MD   2 mg at 08/11/14 1100  . buPROPion (WELLBUTRIN XL) 24 hr tablet 750 mg  750 mg Oral Daily Costin Karlyne Greenspan, MD   750 mg at 08/11/14 1100  . cyclobenzaprine (FLEXERIL) tablet 10 mg  10 mg Oral QHS Caren Griffins, MD   10 mg at 08/10/14 2202  . FLUoxetine (PROZAC) capsule 20 mg  20 mg Oral TID Caren Griffins, MD   20 mg at 08/11/14 1100  . irbesartan (AVAPRO) tablet 75 mg  75 mg Oral Daily Costin Karlyne Greenspan, MD   Stopped at 08/11/14 1100   And  . hydrochlorothiazide (MICROZIDE) capsule 12.5 mg  12.5 mg Oral Daily Costin Karlyne Greenspan, MD   Stopped at 08/11/14 1100  . HYDROmorphone (DILAUDID) injection 0.5-1 mg  0.5-1 mg Intravenous Q2H PRN Samuella Cota, MD   1 mg at 08/11/14 0850  . nicotine (NICODERM CQ - dosed in mg/24  hours) patch 14 mg  14 mg Transdermal Daily Costin Karlyne Greenspan, MD   14 mg at 08/11/14 1104  . ondansetron (ZOFRAN-ODT) disintegrating tablet 4 mg  4 mg Oral Q8H PRN Costin Karlyne Greenspan, MD      . oxyCODONE (Oxy IR/ROXICODONE) immediate release tablet 15 mg  15 mg Oral Q4H PRN Samuella Cota, MD   15 mg at 08/11/14 1130  . pantoprazole (PROTONIX) EC tablet 40 mg  40 mg Oral Q2000 Samuella Cota, MD   40 mg at 08/10/14 2021  . simvastatin (ZOCOR) tablet 40 mg  40 mg Oral QHS Caren Griffins, MD   40 mg  at 08/10/14 2202  . tiotropium (SPIRIVA) inhalation capsule 18 mcg  18 mcg Inhalation Daily Caren Griffins, MD   18 mcg at 08/11/14 0940  . zolpidem (AMBIEN) tablet 5 mg  5 mg Oral QHS PRN Costin Karlyne Greenspan, MD        REVIEW OF SYSTEMS:  A 15 point review of systems is documented in the electronic medical record. This was obtained by the nursing staff. However, I reviewed this with the patient to discuss relevant findings and make appropriate changes.  Pertinent items are noted in HPI.   PHYSICAL EXAM:  height is 5\' 5"  (1.651 m) and weight is 222 lb 7.1 oz (100.9 kg). Her oral temperature is 98.6 F (37 C). Her blood pressure is 118/55 and her pulse is 84. Her respiration is 20 and oxygen saturation is 95%.   Per Dr. Sarajane Jews:  Afebrile, vital signs stable. No hypoxia. Psychiatric. Alert. Speech fluent and clear. Cardiovascular regular rate and rhythm. No murmur, rub or gallop.  Respiratory clear to auscultation bilaterally. No wheezes, rales or rhonchi. Normal respiratory effort. Gen. Appears calm and comfortable. Muscular skeletal. Excellent bilateral lower extremity strength. Sensation grossly intact.  KPS = 30 due to pain  100 - Normal; no complaints; no evidence of disease. 90   - Able to carry on normal activity; minor signs or symptoms of disease. 80   - Normal activity with effort; some signs or symptoms of disease. 32   - Cares for self; unable to carry on normal activity or to do active work. 60   - Requires occasional assistance, but is able to care for most of his personal needs. 50   - Requires considerable assistance and frequent medical care. 19   - Disabled; requires special care and assistance. 31   - Severely disabled; hospital admission is indicated although death not imminent. 53   - Very sick; hospital admission necessary; active supportive treatment necessary. 10   - Moribund; fatal processes progressing rapidly. 0     - Dead  Karnofsky DA, Abelmann [place], Craver LS  and Burchenal JH [phone]) The use of the nitrogen mustards in the palliative treatment of carcinoma: with particular reference to bronchogenic carcinoma Cancer 1 [id number]  LABORATORY DATA:  Lab Results  Component Value Date   WBC 15.0* 08/08/2014   HGB 13.9 08/08/2014   HCT 41.0 08/08/2014   MCV 90.7 08/08/2014   PLT 335 08/08/2014   Lab Results  Component Value Date   NA 133* 08/08/2014   K 4.2 08/08/2014   CL 100 08/08/2014   CO2 23 08/08/2014   Lab Results  Component Value Date   ALT 12 03/06/2007   AST 15 03/06/2007   ALKPHOS 81 03/06/2007   BILITOT 0.7 03/06/2007     RADIOGRAPHY: Dg Lumbar Spine Complete  08/04/2014   CLINICAL  DATA:  Low back pain, unspecified back pain lateral laterality, with sciatica presence unspecified.  EXAM: LUMBAR SPINE - COMPLETE 4+ VIEW  COMPARISON:  None.  FINDINGS: Diffuse osteopenia is noted. No fracture or spondylolisthesis is noted. Atherosclerotic calcifications of abdominal aorta are noted. Disc spaces are well-maintained. Posterior facet joints appear normal. Mild dextroscoliosis of upper lumbar spine is noted.  IMPRESSION: No acute abnormality seen in the lumbar spine.   Electronically Signed   By: Sabino Dick M.D.   On: 08/04/2014 15:47   Ct Chest W Contrast  08/08/2014   CLINICAL DATA:  Mediastinal lymphadenopathy on thoracic spine MRI. History of breast cancer.  EXAM: CT CHEST, ABDOMEN, AND PELVIS WITH CONTRAST  TECHNIQUE: Multidetector CT imaging of the chest, abdomen and pelvis was performed following the standard protocol during bolus administration of intravenous contrast.  CONTRAST:  130mL OMNIPAQUE IOHEXOL 300 MG/ML  SOLN  COMPARISON:  Thoracic and lumbar spine MRI dated 08/08/2014  FINDINGS: CT CHEST FINDINGS  Mild patchy/ground-glass opacities in the left upper lobe (series 5/image 15) and right upper lobe (for example, series 5/ image 18), suspicious for mild pneumonia.  Mild dependent atelectasis in the bilateral lower lobes. No  pleural effusion or pneumothorax.  Visualized thyroid is unremarkable.  Heart is normal in size. No pericardial effusion. Mild atherosclerotic calcifications of the aortic arch.  Thoracic lymphadenopathy, including:  --12 mm short axis right supraclavicular node (series 2/ image 1)  --26 mm short axis high right paratracheal node (series 2/ image 12)  --17 mm short axis right axillary node  --22 mm short axis partially necrotic subcarinal node (series 2/image 26)  Status post left mastectomy with reconstruction.  Mild superior endplate compression fracture deformity at T10 with associated underlying lytic lesion along the posterior vertebral body (sagittal image 59), better evaluated on MRI. Mild retropulsion.  CT ABDOMEN AND PELVIS FINDINGS  Hepatobiliary: Liver is within normal limits. No suspicious/enhancing hepatic lesions.  Layering gallstones (series 2/image 54), without associated inflammatory changes.  Pancreas: Within normal limits.  Spleen: Within normal limits.  Adrenals/Urinary Tract: Adrenal glands are unremarkable.  Kidneys are within normal limits.  No hydronephrosis.  Bladder is notable for layering excretory MRI contrast.  Stomach/Bowel: Moderate hiatal hernia. Stomach is otherwise within normal limits.  No evidence of bowel obstruction.  Suspected prior appendectomy.  Prior distal colonic resection with anastomosis in the left pelvis (series 2/image 94).  Vascular/Lymphatic: Atherosclerotic calcifications of the abdominal aorta and branch vessels.  No suspicious abdominopelvic lymphadenopathy.  Reproductive: Status post hysterectomy.  No adnexal masses.  Other: No abdominopelvic ascites.  Musculoskeletal: Mild superior endplate compression fracture deformity at L3 with associated lytic lesion along the anterior vertebral body (sagittal image 27). No retropulsion.  Lucencies involving the sacrum likely reflect focal fat when correlating with MRI.  IMPRESSION: Thoracic lymphadenopathy, including  right supraclavicular and axillary lymphadenopathy, as above. Primary differential considerations include nonvisualized (right) breast or lung cancer, or possibly lymphoma.  No suspicious lymphadenopathy in the abdomen or pelvis.  Pathologic fractures involving T10 and L3, better evaluated on MRI.   Electronically Signed   By: Julian Hy M.D.   On: 08/08/2014 15:09   Mr Thoracic Spine W Wo Contrast  08/08/2014   CLINICAL DATA:  Progressive low back pain over the last 2 weeks with radiation into the leg and difficulty ambulating. History of breast cancer. Initial encounter.  EXAM: MRI THORACIC AND LUMBAR SPINE WITHOUT AND WITH CONTRAST  TECHNIQUE: Multiplanar and multiecho pulse sequences of the thoracic and  lumbar spine were obtained without and with intravenous contrast.  CONTRAST:  51mL MULTIHANCE GADOBENATE DIMEGLUMINE 529 MG/ML IV SOLN  COMPARISON:  Lumbar spine radiographs 08/04/2014.  FINDINGS: MR THORACIC SPINE FINDINGS  Sagittal localizing images demonstrate no definite abnormalities within the cervical spine. There is diffuse replacement of the normal marrow signal within the T10 vertebral body associated with a mild superior endplate compression deformity and mild osseous retropulsion on the right. Post-contrast, there is diffuse enhancement of the T10 vertebral body. Appearance is highly worrisome for metastatic disease with a pathologic fracture. There may be a small amount of enhancing epidural tumor on the right. No cord compression is present.  No other osseous metastases or fractures are seen within the thoracic spine.  The thoracic cord is normal in signal and caliber. There is no abnormal intradural enhancement.  There is extensive mediastinal lymphadenopathy. There is a large right paratracheal mass measuring 4.5 x 3.5 cm. There is a subcarinal nodal mass measuring 2.4 x 3.5 cm. There is probable lower esophageal adenopathy as well.  There is minimal thoracic spondylosis without disc  herniation or foraminal compromise.  MR LUMBAR SPINE FINDINGS  There are 5 lumbar type vertebral bodies. There is superior endplate Schmorl's node at L3. Bone marrow edema and enhancement are present throughout the L3 vertebral body. There is no osseous retropulsion or epidural tumor. Although less concerning than the findings at T10, this likely represents metastatic disease as well.  No other osseous metastases or fractures are seen within the lumbar spine.  The conus medullaris extends to the L1-2 disc space level. There is no abnormal intradural enhancement. No paraspinal mass is demonstrated.  There is mild lumbar spondylosis. At L2-3 and L3-4, there is mild disc bulging without resulting spinal stenosis or nerve root encroachment. At L4-5, the disc bulging is eccentric laterally to the left but does not causes any L4 nerve root encroachment. Disc degeneration and osteophytes at L5-S1 are asymmetric to the right. There are mild facet degenerative changes throughout the lower lumbar spine.  IMPRESSION: 1. Mediastinal lymphadenopathy highly worrisome for metastatic disease. This pattern is atypical for metastatic breast cancer and is more suggestive of lung cancer or possibly lymphoma. CT (at least of the chest; consider including the abdomen and pelvis) recommended for further evaluation. 2. Fractures at T10 and L3 are both suspicious for pathologic fractures from underlying metastatic disease. There is possibly a small amount of epidural tumor associated with the T10 fracture. No cord compression or abnormal intradural enhancement identified. 3. These results were called by telephone at the time of interpretation on 08/08/2014 at 12:14 pm to Dr. Joseph Berkshire , who verbally acknowledged these results.   Electronically Signed   By: Camie Patience M.D.   On: 08/08/2014 12:16   Mr Lumbar Spine W Wo Contrast  08/08/2014   CLINICAL DATA:  Progressive low back pain over the last 2 weeks with radiation into  the leg and difficulty ambulating. History of breast cancer. Initial encounter.  EXAM: MRI THORACIC AND LUMBAR SPINE WITHOUT AND WITH CONTRAST  TECHNIQUE: Multiplanar and multiecho pulse sequences of the thoracic and lumbar spine were obtained without and with intravenous contrast.  CONTRAST:  31mL MULTIHANCE GADOBENATE DIMEGLUMINE 529 MG/ML IV SOLN  COMPARISON:  Lumbar spine radiographs 08/04/2014.  FINDINGS: MR THORACIC SPINE FINDINGS  Sagittal localizing images demonstrate no definite abnormalities within the cervical spine. There is diffuse replacement of the normal marrow signal within the T10 vertebral body associated with a mild superior endplate compression  deformity and mild osseous retropulsion on the right. Post-contrast, there is diffuse enhancement of the T10 vertebral body. Appearance is highly worrisome for metastatic disease with a pathologic fracture. There may be a small amount of enhancing epidural tumor on the right. No cord compression is present.  No other osseous metastases or fractures are seen within the thoracic spine.  The thoracic cord is normal in signal and caliber. There is no abnormal intradural enhancement.  There is extensive mediastinal lymphadenopathy. There is a large right paratracheal mass measuring 4.5 x 3.5 cm. There is a subcarinal nodal mass measuring 2.4 x 3.5 cm. There is probable lower esophageal adenopathy as well.  There is minimal thoracic spondylosis without disc herniation or foraminal compromise.  MR LUMBAR SPINE FINDINGS  There are 5 lumbar type vertebral bodies. There is superior endplate Schmorl's node at L3. Bone marrow edema and enhancement are present throughout the L3 vertebral body. There is no osseous retropulsion or epidural tumor. Although less concerning than the findings at T10, this likely represents metastatic disease as well.  No other osseous metastases or fractures are seen within the lumbar spine.  The conus medullaris extends to the L1-2 disc  space level. There is no abnormal intradural enhancement. No paraspinal mass is demonstrated.  There is mild lumbar spondylosis. At L2-3 and L3-4, there is mild disc bulging without resulting spinal stenosis or nerve root encroachment. At L4-5, the disc bulging is eccentric laterally to the left but does not causes any L4 nerve root encroachment. Disc degeneration and osteophytes at L5-S1 are asymmetric to the right. There are mild facet degenerative changes throughout the lower lumbar spine.  IMPRESSION: 1. Mediastinal lymphadenopathy highly worrisome for metastatic disease. This pattern is atypical for metastatic breast cancer and is more suggestive of lung cancer or possibly lymphoma. CT (at least of the chest; consider including the abdomen and pelvis) recommended for further evaluation. 2. Fractures at T10 and L3 are both suspicious for pathologic fractures from underlying metastatic disease. There is possibly a small amount of epidural tumor associated with the T10 fracture. No cord compression or abnormal intradural enhancement identified. 3. These results were called by telephone at the time of interpretation on 08/08/2014 at 12:14 pm to Dr. Joseph Berkshire , who verbally acknowledged these results.   Electronically Signed   By: Camie Patience M.D.   On: 08/08/2014 12:16   Ct Abdomen Pelvis W Contrast  08/08/2014   CLINICAL DATA:  Mediastinal lymphadenopathy on thoracic spine MRI. History of breast cancer.  EXAM: CT CHEST, ABDOMEN, AND PELVIS WITH CONTRAST  TECHNIQUE: Multidetector CT imaging of the chest, abdomen and pelvis was performed following the standard protocol during bolus administration of intravenous contrast.  CONTRAST:  123mL OMNIPAQUE IOHEXOL 300 MG/ML  SOLN  COMPARISON:  Thoracic and lumbar spine MRI dated 08/08/2014  FINDINGS: CT CHEST FINDINGS  Mild patchy/ground-glass opacities in the left upper lobe (series 5/image 15) and right upper lobe (for example, series 5/ image 18),  suspicious for mild pneumonia.  Mild dependent atelectasis in the bilateral lower lobes. No pleural effusion or pneumothorax.  Visualized thyroid is unremarkable.  Heart is normal in size. No pericardial effusion. Mild atherosclerotic calcifications of the aortic arch.  Thoracic lymphadenopathy, including:  --12 mm short axis right supraclavicular node (series 2/ image 1)  --26 mm short axis high right paratracheal node (series 2/ image 12)  --17 mm short axis right axillary node  --22 mm short axis partially necrotic subcarinal node (series 2/image 26)  Status post left mastectomy with reconstruction.  Mild superior endplate compression fracture deformity at T10 with associated underlying lytic lesion along the posterior vertebral body (sagittal image 59), better evaluated on MRI. Mild retropulsion.  CT ABDOMEN AND PELVIS FINDINGS  Hepatobiliary: Liver is within normal limits. No suspicious/enhancing hepatic lesions.  Layering gallstones (series 2/image 54), without associated inflammatory changes.  Pancreas: Within normal limits.  Spleen: Within normal limits.  Adrenals/Urinary Tract: Adrenal glands are unremarkable.  Kidneys are within normal limits.  No hydronephrosis.  Bladder is notable for layering excretory MRI contrast.  Stomach/Bowel: Moderate hiatal hernia. Stomach is otherwise within normal limits.  No evidence of bowel obstruction.  Suspected prior appendectomy.  Prior distal colonic resection with anastomosis in the left pelvis (series 2/image 94).  Vascular/Lymphatic: Atherosclerotic calcifications of the abdominal aorta and branch vessels.  No suspicious abdominopelvic lymphadenopathy.  Reproductive: Status post hysterectomy.  No adnexal masses.  Other: No abdominopelvic ascites.  Musculoskeletal: Mild superior endplate compression fracture deformity at L3 with associated lytic lesion along the anterior vertebral body (sagittal image 27). No retropulsion.  Lucencies involving the sacrum likely  reflect focal fat when correlating with MRI.  IMPRESSION: Thoracic lymphadenopathy, including right supraclavicular and axillary lymphadenopathy, as above. Primary differential considerations include nonvisualized (right) breast or lung cancer, or possibly lymphoma.  No suspicious lymphadenopathy in the abdomen or pelvis.  Pathologic fractures involving T10 and L3, better evaluated on MRI.   Electronically Signed   By: Julian Hy M.D.   On: 08/08/2014 15:09      IMPRESSION: This patient is a very nice 63 yo woman with two spinal compression fractures which are likely pathologic from metastatic malignancy, biopsy pending.  If malignancy is confirmed, palliative radiotherapy to the spine may be beneficial to palliate pain and prevent spinal cord injury.  In terms of assessing for possible surgical intervention/stabilization, I am approaching that question with a SINS calculator:  T10 Spine Instability Neoplastic Score (SINS): SINS Component Description Score  Location Junctional (Occ-C2, C7-T2, T11-L1, L5-S1) Mobile (C3-6, L2-4) Semirigid (T3-10) Rigid (S2-5) 3 2 1  0  Pain Yes Occasional, non-mechanical No 3 1 0  Bone Lesion Lytic Mixed Blastic 2 1 0  Alignment Subluxation/Translation De Novo deformity Normal 4 2 0  Vertebral Body >50% collapse <50% collapse No collapse >50% VB involved None of above 3 2 1  0  Posterolateral Involvment Bilateral Unilateral 3 1   Tallied Score from 6 Components: Stable Potentially Unstable Unstable  0-6 7-12 13-18   SINS Score: 11   L3 Spine Instability Neoplastic Score (SINS): SINS Component Description Score  Location Junctional (Occ-C2, C7-T2, T11-L1, L5-S1) Mobile (C3-6, L2-4) Semirigid (T3-10) Rigid (S2-5) 3 2 1  0  Pain Yes Occasional, non-mechanical No 3 1 0  Bone Lesion Lytic Mixed Blastic 2 1 0  Alignment Subluxation/Translation De Novo deformity Normal 4 2 0  Vertebral Body >50% collapse <50%  collapse No collapse >50% VB involved None of above 3 2 1  0  Posterolateral Involvment Bilateral Unilateral 3 1   Tallied Score from 6 Components: Stable Potentially Unstable Unstable  0-6 7-12 13-18   SINS Score: 6   Fisher CG, et al. A novel classification system for spinal instability in neoplastic disease: an evidence-based approach and expert consensus from the Spine Oncology Study Group. Spine  03(54):S5681-2, 2010  Based on SINS score, would not suggest neurosurgical intervention at this time.     PLAN:  Agree with awaiting biopsy result with ongoing neuro checks to rule out spinal  cord injury if the tumor progresses.  As long as she is neurologically intact with no signs of spinal cord compression, we will await biopsy to coordinate further intervention.  If she develops signs of spinal cord compression, she may require steroids and initiation of emergent radiotherapy.  If the patient can be discharged to home, we will follow-up as an outpatient on the biopsy results.   I spent 30 minutes minutes face to face with the patient and more than 50% of that time was spent in counseling and/or coordination of care.   ------------------------------------------------  [name]. [name], M.D.        Annita Brod

## 2014-08-11 NOTE — Progress Notes (Signed)
PROGRESS NOTE  [name] [id number]:[id number] DOB: [birth year] DOA: 08/08/2014 PCP: [name], MD  Summary: 62yow presented with 2 week h/o back pain. MRI revealed T10, L3 fxs as well as significant LAD. CT chest, abdomen, pelvis did not review primary tumor. Admitted for further evaluation of back pain.  Assessment/Plan: 1. Back pain secondary to T10, L3 fxs, presumed pathologic. No neurologic symptoms. Pain control remains suboptimal. 2. Mediastinal LAD. Status post lymph node biopsy 11/25. Discussed with pathology office today, results will not be available until 11/30. 3. Constipation. 4. Hyperkalemia, resolved.  5. Mild hyponatremia. Likely secondary to hydrochlorothiazide. 6. HTN, remains stable. 7. Anxiety, depression 8. H/o left breast phylloides tumor. 9. COPD 10. Tobacco dependence.    Overall remains clinically stable. She has no signs of neurologic dysfunction. Plan to continue TSLO.  Increase oral pain medication, start long-acting pain medication, use IV pain medication for breakthrough. Discussed rationale for incremental increase.  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 Patient remains without neuro symptoms. Discussed with radiation oncology and neurosurgery, recommended TSLO brace to stabilize T10, can ambulate ad lib once spine braced. May sleep and shower without brace. No indication for surgery. Follow-up clinically.  Neuro checks q4 hours. Monitor for development of neurologic symptoms.  Bowel regimen  Discussed with son, daughter-in-law at bedside.  Code Status: full code DVT prophylaxis: SCDs Family Communication:  Disposition Plan: home when improved  Emily Hodgkins, MD  [facility]  Pager [phone] If 7PM-7AM, please contact night-coverage at [email or web address], password [facility] 08/11/2014, 2:53 PM  LOS: 3 days   Consultants:  General surgery    Procedures: 11/24 Right axilla lymph node biopsy.  Antibiotics:    HPI/Subjective: She is doing well with activity but she continues to have significant pain, oral pain medication not effective. Continues to require IV pain medication. She has no neurologic symptoms, no lower extremity weakness, no paresthesias no bowel or bladder dysfunction.  Objective: Filed Vitals:   08/11/14 0437 08/11/14 0940 08/11/14 1100 08/11/14 1400  BP: 129/60  118/55 110/57  Pulse: 90  84 87  Temp: 98.6 F (37 C)   99.5 F (37.5 C)  TempSrc: Oral   Oral  Resp: 20   18  Height:      Weight:      SpO2: 94% 95%      Intake/Output Summary (Last 24 hours) at 08/11/14 1453 Last data filed at 08/11/14 1400  Gross per 24 hour  Intake    310 ml  Output    700 ml  Net   -390 ml     Filed Weights   08/09/14 0446  Weight: 100.9 kg (222 lb 7.1 oz)    Exam:     Afebrile, vital signs are stable.  Appears calm, comfortable. Speech fluent and clear.  Respiratory clear to auscultation bilaterally. No wheezes, rales or rhonchi. Normal respiratory effort.  Cardiovascular regular rate and rhythm. No murmur, rub or gallop.  Musculoskeletal. Moves both legs well. Sensation intact.  Data Reviewed:  Scheduled Meds: . ARIPiprazole  2 mg Oral Daily  . buPROPion  750 mg Oral Daily  . cyclobenzaprine  10 mg Oral QHS  . FLUoxetine  20 mg Oral TID  . irbesartan  75 mg Oral Daily   And  . hydrochlorothiazide  12.5 mg Oral Daily  . nicotine  14 mg Transdermal Daily  . OxyCODONE  15 mg Oral Q12H  . pantoprazole  40  mg Oral Q2000  . polyethylene glycol  17 g Oral BID  . senna  1 tablet Oral QHS  . simvastatin  40 mg Oral QHS  . tiotropium  18 mcg Inhalation Daily   Continuous Infusions:    Principal Problem:   Pathological fracture Active Problems:   Lymphadenopathy, mediastinal   HTN (hypertension)   History of lump of left breast   Non-traumatic compression fracture of vertebral  column   Time spent 25 minutes

## 2014-08-11 NOTE — Progress Notes (Signed)
Physical Therapy Treatment Patient Details Name: Emily West [facility] MRN: [id number] DOB: 12/17/1950 Today's Date: 08/11/2014    History of Present Illness Pt s/p 2 weeks increasing back pain with pathologic fxs at T10 and L3    PT Comments    Pt continues pleasant and cooperative but ltd this pm by increased pain and fatigue.  Pts husband present and observing pt to/from bathroom and log roll in/out bed.  Pt states she still plans d/c to home with assist of husband and daughter in law as needed.  Follow Up Recommendations  Home health PT;Supervision/Assistance - 24 hour     Equipment Recommendations  Rolling walker with 5" wheels;3in1 (PT)    Recommendations for Other Services OT consult     Precautions / Restrictions Precautions Precautions: Fall Required Braces or Orthoses: Spinal Brace Spinal Brace: Thoracolumbosacral orthotic Restrictions Weight Bearing Restrictions: No    Mobility  Bed Mobility Overal bed mobility: Needs Assistance Bed Mobility: Supine to Sit;Sit to Supine     Supine to sit: Min assist;Mod assist Sit to supine: Min assist;Mod assist   General bed mobility comments: Multi modal cues for correct log roll technique  Transfers Overall transfer level: Needs assistance Equipment used: Rolling walker (2 wheeled) Transfers: Sit to/from Stand Sit to Stand: Min assist         General transfer comment: cues for transition position and use of UEs to self assist  Ambulation/Gait Ambulation/Gait assistance: Min assist Ambulation Distance (Feet): 20 Feet (20' twice to/from bathroom) Assistive device: Rolling walker (2 wheeled) Gait Pattern/deviations: Step-to pattern;Step-through pattern;Decreased step length - right;Decreased step length - left;Shuffle;Trunk flexed Gait velocity: decr   General Gait Details: cues for position from [employer] Mobility    Modified Rankin (Stroke Patients Only)       Balance                                     Cognition Arousal/Alertness: Awake/alert Behavior During Therapy: WFL for tasks assessed/performed Overall Cognitive Status: Within Functional Limits for tasks assessed                      Exercises General Exercises - Lower Extremity Ankle Circles/Pumps: AROM;Both;15 reps;Supine    General Comments        Pertinent Vitals/Pain Pain Assessment: 0-10 Pain Score: 6  Pain Location: back Pain Descriptors / Indicators: Aching;Sore Pain Intervention(s): Limited activity within patient's tolerance;Monitored during session;Premedicated before session    Home Living                      Prior Function            PT Goals (current goals can now be found in the care plan section) Acute Rehab PT Goals Patient Stated Goal: Home with husband PT Goal Formulation: With patient Time For Goal Achievement: 08/24/14 Potential to Achieve Goals: Good Progress towards PT goals: Not progressing toward goals - comment (Increased pain this date)    Frequency  Min 5X/week    PT Plan Discharge plan needs to be updated    Co-evaluation             End of Session   Activity Tolerance: Patient tolerated treatment well Patient left: in bed;with call bell/phone within reach;with family/visitor present     Time: 0093-8182  PT Time Calculation (min) (ACUTE ONLY): 38 min  Charges:  $Gait Training: 8-22 mins $Therapeutic Activity: 8-22 mins                    G Codes:      Shaughn Thomley Aug 31, 2014, 4:11 PM

## 2014-08-11 NOTE — Plan of Care (Signed)
Problem: Phase I Progression Outcomes Goal: Hemodynamically stable Outcome: Completed/Met Date Met:  08/11/14

## 2014-08-11 NOTE — Plan of Care (Signed)
Problem: Discharge Progression Outcomes Goal: Tolerating diet Outcome: Completed/Met Date Met:  08/11/14

## 2014-08-12 MED ORDER — OXYCODONE HCL ER 20 MG PO T12A
20.0000 mg | EXTENDED_RELEASE_TABLET | Freq: Two times a day (BID) | ORAL | Status: DC
  Administered 2014-08-12 – 2014-08-15 (×6): 20 mg via ORAL
  Filled 2014-08-12 (×6): qty 1

## 2014-08-12 MED ORDER — POLYETHYLENE GLYCOL 3350 17 G PO PACK
17.0000 g | PACK | Freq: Once | ORAL | Status: AC
  Administered 2014-08-12: 17 g via ORAL
  Filled 2014-08-12: qty 1

## 2014-08-12 NOTE — Plan of Care (Signed)
Problem: Discharge Progression Outcomes Goal: Pain controlled with appropriate interventions Outcome: Completed/Met Date Met:  08/12/14

## 2014-08-12 NOTE — Progress Notes (Signed)
PROGRESS NOTE  Emily West [facility] XBJ:[id number] DOB: 1951/02/18 DOA: 08/08/2014 PCP: [name], MD  Summary: 62yow presented with 2 week h/o back pain. MRI revealed T10, L3 fxs as well as significant LAD. CT chest, abdomen, pelvis did not review primary tumor. Admitted for further evaluation of back pain.  Assessment/Plan: 1. Back pain secondary to T10, L3 fxs, presumed pathologic. No neurologic symptoms. Still requiring IV pain medication. 2. Mediastinal LAD. Status post lymph node biopsy 11/25. Results will not be available until 11/30. 3. Constipation. 4. Mild hyponatremia. Likely secondary to hydrochlorothiazide, discontinued for now with normal BP. 5. HTN, stable. 6. Anxiety, depression 7. H/o left breast phylloides tumor. 8. COPD, stable 9. Tobacco dependence.    She remains without neurologic symptoms. Main issues pain control at this point. Continue TSLO.  Increase long-acting oral pain medication, continue oral when necessary, use IV pain medication for breakthrough.   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 Patient remains without neuro symptoms as of 11/28. Discussed with radiation oncology and neurosurgery, recommended TSLO brace to stabilize T10, can ambulate ad lib when spine braced. May sleep and shower without brace. No indication for surgery. Follow-up clinically. Appreciate Dr. Johny Shears evaluation.  Neuro checks q4 hours. Monitor for development of neurologic symptoms.  Increase bowel regimen. Mg citrate in AM if no BM with current regimen  Code Status: full code DVT prophylaxis: SCDs Family Communication:  Disposition Plan: home when improved  [name], MD  [facility]  Pager [phone] If 7PM-7AM, please contact night-coverage at [email or web address], password [facility] 08/12/2014, 2:36 PM  LOS: 4 days   Consultants:  General surgery   Procedures: 11/24 Right  axilla lymph node biopsy.  Antibiotics:    HPI/Subjective: Overall feeling better but she continues to have significant pain not controlled by oral medications. She has no bowel or bladder dysfunction. No neurologic symptoms, weakness or numbness.  Objective: Filed Vitals:   08/11/14 2110 08/12/14 0630 08/12/14 0952 08/12/14 1302  BP: 112/54 109/59 117/49   Pulse: 81 89 89   Temp: 98.1 F (36.7 C) 99.4 F (37.4 C)    TempSrc: Oral Oral    Resp: 18 18    Height:      Weight:      SpO2: 95% 96%  93%    Intake/Output Summary (Last 24 hours) at 08/12/14 1436 Last data filed at 08/12/14 1300  Gross per 24 hour  Intake    890 ml  Output    550 ml  Net    340 ml     Filed Weights   08/09/14 0446  Weight: 100.9 kg (222 lb 7.1 oz)    Exam:     Afebrile, vital signs stable.  Appears calm, comfortable, speech fluent and clear.  Respiratory clear to auscultation bilaterally. No wheezes, rales or rhonchi. Normal respiratory effort.  Cardiovascular regular rate and rhythm. No murmur, rub or gallop.  Excellent bilateral lower extremity strength. Sensation intact both lower extremities.  Data Reviewed:  Scheduled Meds: . ARIPiprazole  2 mg Oral Daily  . buPROPion  750 mg Oral Daily  . cyclobenzaprine  10 mg Oral QHS  . FLUoxetine  20 mg Oral TID  . nicotine  14 mg Transdermal Daily  . OxyCODONE  20 mg Oral Q12H  . pantoprazole  40 mg Oral Q2000  . polyethylene glycol  17 g Oral BID  . polyethylene glycol  17 g Oral Once  .  senna  1 tablet Oral QHS  . simvastatin  40 mg Oral QHS  . tiotropium  18 mcg Inhalation Daily   Continuous Infusions:    Principal Problem:   Pathological fracture Active Problems:   Lymphadenopathy, mediastinal   HTN (hypertension)   History of lump of left breast   Non-traumatic compression fracture of vertebral column   Time spent 15 minutes

## 2014-08-12 NOTE — Plan of Care (Signed)
Problem: Phase II Progression Outcomes Goal: Progress activity as tolerated unless otherwise ordered Outcome: Progressing Receiving OT/PT and reports improved activity level.  Problem: Phase III Progression Outcomes Goal: Pain controlled on oral analgesia Outcome: Not Progressing Pt continues to have difficulty with pain control.  Requiring po and IV meds. Goal: Voiding independently Outcome: Completed/Met Date Met:  08/12/14 Goal: Foley discontinued Outcome: Completed/Met Date Met:  08/12/14

## 2014-08-12 NOTE — Progress Notes (Signed)
PT Cancellation Note  Patient Details Name: Delecia Vastine [facility] MRN: [id number] DOB: [birth year]   Cancelled Treatment:    Reason Eval/Treat Not Completed: Other (comment)checked in AM, had visitor,  1hr ago eating, now back to bed. Will return  Tomorrow.   Claretha Cooper 08/12/2014, 2:18 PM Tresa Endo PT [phone]

## 2014-08-12 NOTE — Progress Notes (Signed)
Occupational Therapy Treatment Patient Details Name: Emily West [facility] MRN: [id number] DOB: 12-24-1950 Today's Date: 08/12/2014    History of present illness Pt s/p 2 weeks increasing back pain with pathologic fxs at T10 and L3   OT comments  Pt pleased to sit in chair and eat lunch  Follow Up Recommendations  Supervision/Assistance - 24 hour;Home health OT    Equipment Recommendations  3 in 1 bedside comode;Tub/shower bench    Recommendations for Other Services      Precautions / Restrictions Precautions Precautions: Fall Required Braces or Orthoses: Spinal Brace Spinal Brace: Thoracolumbosacral orthotic Restrictions Weight Bearing Restrictions: No       Mobility Bed Mobility Overal bed mobility: Needs Assistance Bed Mobility: Supine to Sit;Sit to Supine     Supine to sit: Mod assist Sit to supine: Min assist   General bed mobility comments: verbal cues needed  Transfers Overall transfer level: Needs assistance Equipment used: Rolling walker (2 wheeled) Transfers: Sit to/from Stand Sit to Stand: Min assist         General transfer comment: VC to use BUe to push up and reach back    Balance                                   ADL Overall ADL's : Needs assistance/impaired                 Upper Body Dressing : Maximal assistance;Sitting Upper Body Dressing Details (indicate cue type and reason): donning brace Lower Body Dressing: Sit to/from stand;Maximal assistance Lower Body Dressing Details (indicate cue type and reason): will need and benefit from AE     Toileting- Clothing Manipulation and Hygiene: Adhering to back precautions;Minimal assistance;Sit to/from stand Toileting - Clothing Manipulation Details (indicate cue type and reason): increased time and VC for back precautions                        Cognition   Behavior During Therapy: [facility] for tasks assessed/performed Overall Cognitive Status: Within Functional  Limits for tasks assessed                               General Comments  donned brace EOB.  OT did speak with MD Sarajane Jews) regarding brace. Pt had been to bathroom without it.  Will clarify    Pertinent Vitals/ Pain       Pain Score: 6  Pain Location: back Pain Intervention(s): Limited activity within patient's tolerance         Frequency Min 2X/week     Progress Toward Goals  OT Goals(current goals can now be found in the care plan section)  Progress towards OT goals: Progressing toward goals     Plan Discharge plan remains appropriate       End of Session     Activity Tolerance Patient tolerated treatment well   Patient Left in chair;with call bell/phone within reach   Nurse Communication Mobility status        Time: 2094-7096 OT Time Calculation (min): 24 min  Charges: OT General Charges $OT Visit: 1 Procedure OT Treatments $Self Care/Home Management : 23-37 mins  Marqueze Ramcharan, Thereasa Parkin 08/12/2014, 1:34 PM

## 2014-08-12 NOTE — Plan of Care (Signed)
Problem: Phase I Progression Outcomes Goal: OOB as tolerated unless otherwise ordered Outcome: Completed/Met Date Met:  08/12/14     

## 2014-08-13 DIAGNOSIS — IMO0002 Reserved for concepts with insufficient information to code with codable children: Secondary | ICD-10-CM

## 2014-08-13 DIAGNOSIS — M8448XA Pathological fracture, other site, initial encounter for fracture: Secondary | ICD-10-CM

## 2014-08-13 DIAGNOSIS — R0902 Hypoxemia: Secondary | ICD-10-CM

## 2014-08-13 MED ORDER — FLEET ENEMA 7-19 GM/118ML RE ENEM
1.0000 | ENEMA | Freq: Once | RECTAL | Status: AC
  Administered 2014-08-13: 1 via RECTAL
  Filled 2014-08-13: qty 1

## 2014-08-13 MED ORDER — ALBUTEROL SULFATE (2.5 MG/3ML) 0.083% IN NEBU: 2.5000 mg | INHALATION_SOLUTION | RESPIRATORY_TRACT | Status: DC | PRN

## 2014-08-13 MED ORDER — HYDROMORPHONE HCL 1 MG/ML IJ SOLN
0.5000 mg | INTRAMUSCULAR | Status: DC | PRN
  Administered 2014-08-14: 0.5 mg via INTRAVENOUS
  Administered 2014-08-15 – 2014-08-17 (×4): 1 mg via INTRAVENOUS
  Filled 2014-08-13 (×6): qty 1

## 2014-08-13 NOTE — Progress Notes (Signed)
PROGRESS NOTE  Tariya Morrissette [facility] [id number]:[id number] DOB: 1950/11/29 DOA: 08/08/2014 PCP: [name], MD  Summary: 62yow presented with 2 week h/o back pain. MRI revealed T10, L3 fxs as well as significant LAD. CT chest, abdomen, pelvis did not review primary tumor. Admitted for further evaluation of back pain.  Assessment/Plan: 1. Back pain secondary to T10, L3 fxs, presumed pathologic. No neurologic symptoms. Pain control somewhat improved. 2. Mediastinal LAD. Status post lymph node biopsy 11/25. Results expected 11/30. 3. Hypoxia. Likely secondary to atelectasis, shallow breathing because of pain superimposed on COPD. No evidence of infection at this time. 4. H/o left breast phylloides tumor. 5. COPD, remains stable. Continue Spiriva.  6. Tobacco dependence. Recommend cessation. 7. Mild hyponatremia. Likely secondary to hydrochlorothiazide, discontinued for now with normal BP. 8. Constipation. Relieved. Continue aggressive bowel regimen.    Overall pain control was started to improve with increased long-acting narcotics, continue oral when necessary short acting. Hopefully can stop using IV pain medication today.  Continue to mobilize with physical therapy, TSLO brace. Up out of bed. Incentive spirometry.  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 Patient remains without neuro symptoms as of 11/29. Per discussion with neurosurgery continue TSLO brace to stabilize T10, can ambulate ad lib when spine braced. May sleep and shower without brace. No indication for surgery. Follow-up with radiation oncology pending biopsy results.   Neuro checks q4 hours. Monitor for development of neurologic symptoms.  Possibly home 11/30 if can avoid IV pain medication.  Code Status: full code DVT prophylaxis: SCDs Family Communication:  Disposition Plan: home when improved  [name], MD  [facility]    Pager [phone] If 7PM-7AM, please contact night-coverage at [email or web address], password [facility] 08/13/2014, 2:15 PM  LOS: 5 days   Consultants:  General surgery   Procedures: 11/24 Right axilla lymph node biopsy.  Antibiotics:    HPI/Subjective: Bowels moved. Pain control better today, down to 6/10. Ambulating well. No neurologic complaints. No weakness or paresthesias.  Objective: Filed Vitals:   08/12/14 1400 08/12/14 2244 08/13/14 0538 08/13/14 1116  BP: 126/47 109/48 116/48   Pulse: 91 88 92   Temp: 98.4 F (36.9 C) 97.9 F (36.6 C) 98.6 F (37 C)   TempSrc: Oral Oral Oral   Resp: 18 18 20    Height:      Weight:      SpO2: 90% 91% 90% 89%    Intake/Output Summary (Last 24 hours) at 08/13/14 1415 Last data filed at 08/13/14 1000  Gross per 24 hour  Intake   1080 ml  Output    700 ml  Net    380 ml     Filed Weights   08/09/14 0446  Weight: 100.9 kg (222 lb 7.1 oz)    Exam:     Afebrile vital signs are stable. Mild hypoxia noted, especially with ambulation. Taking shallow breaths secondary to pain.  Gen. Appears calm, comfortable. Speech fluent and clear.  Cardiovascular. Regular rate and rhythm. No murmur, rub or gallop.  Respiratory clear to auscultation bilaterally. No wheezes, rales or rhonchi. Normal respiratory effort.  Moves both legs well. Able to lift both off the bed. Strength appears intact.  Data Reviewed:  Multiple voids. BM 3.  Scheduled Meds: . ARIPiprazole  2 mg Oral Daily  . buPROPion  750 mg Oral Daily  . cyclobenzaprine  10 mg Oral QHS  . FLUoxetine  20 mg Oral TID  .  nicotine  14 mg Transdermal Daily  . OxyCODONE  20 mg Oral Q12H  . pantoprazole  40 mg Oral Q2000  . polyethylene glycol  17 g Oral BID  . senna  1 tablet Oral QHS  . simvastatin  40 mg Oral QHS  . tiotropium  18 mcg Inhalation Daily   Continuous Infusions:    Principal Problem:   Pathologic fracture of vertebra Active Problems:   Lymphadenopathy,  mediastinal   HTN (hypertension)   History of lump of left breast   Non-traumatic compression fracture of vertebral column   Hypoxia   Time spent 15 minutes

## 2014-08-13 NOTE — Progress Notes (Signed)
  Radiation Oncology         [phone] ________________________________  Name: Emily West [facility] MRN: [id number]  Date: 08/08/2014  DOB: [birth year]  Chart Note:  Awaiting pathology.  I will likely plan simulation for palliative radiotherapy to T10 and L3 tomorrow, pending pathology.  ________________________________  [name] [name], M.D.

## 2014-08-13 NOTE — Progress Notes (Signed)
Physical Therapy Treatment Patient Details Name: Emily West [facility] MRN: [id number] DOB: February 22, 1951 Today's Date: 08/13/2014    History of Present Illness Pt s/p 2 weeks increasing back pain with pathologic fxs at T10 and L3    PT Comments    Pt required increased time esp to transition from supine to EOB.  Applied back brace then assist with amb to BR.  Pt tolerated standing x 4 min to brush teeth then amb in hallway.  Very slow gait and unsteady.  Amb on 3 lts nasal sats decreased to low 80's.  Instructed on purse lip breathing.  Pt admits she avoids deep breathing as this triggers a painful cough.   Follow Up Recommendations  Home health PT;Supervision/Assistance - 24 hour  pt will need help donning and doffing brace.   Equipment Recommendations  Rolling walker with 5" wheels;3in1 (PT)    Recommendations for Other Services       Precautions / Restrictions Precautions Precautions: Fall Required Braces or Orthoses: Spinal Brace Spinal Brace: Thoracolumbosacral orthotic;Applied in sitting position Restrictions Weight Bearing Restrictions: No    Mobility  Bed Mobility Overal bed mobility: Needs Assistance Bed Mobility: Rolling;Sidelying to Sit;Supine to Sit Rolling: Min guard;Supervision (25% VC's on proper "Log Roll") Sidelying to sit: Mod assist       General bed mobility comments: 25% VC's on proper "Log Roll" tech and increased assist for upper body.  Once EOB pt demon mod posterior lean/push and required several min to self adjust while appling back brace.   Transfers Overall transfer level: Needs assistance Equipment used: Rolling walker (2 wheeled) Transfers: Sit to/from Stand Sit to Stand: Min assist         General transfer comment: VC to use BUe to push up and reach back.  Increased assist to control stand to sit.  Pt required 25% VC's on proper tech.   Ambulation/Gait Ambulation/Gait assistance: +2 safety/equipment;Min assist;Mod assist  Production designer, theatre/television/film) Ambulation Distance (Feet): 42 Feet   Gait Pattern/deviations: Step-to pattern;Step-through pattern;Leaning posteriorly Gait velocity: decreased   General Gait Details: 25% VC's on proper walker use.  proper walker to self distance and safety with turns.  Unsteady gait with lateral sway.  Amb on 3 lts nasal sats ranged from 83 - 91% and HR increased to 118.  Pt admits she avoids deep breathing which triggers a painful cough.  Pt  instructed on purse lip breathing and relaxation tech.     Stairs            Wheelchair Mobility    Modified Rankin (Stroke Patients Only)       Balance                                    Cognition                            Exercises      General Comments        Pertinent Vitals/Pain Pain Score: 7  Pain Location: low back Pain Descriptors / Indicators: Constant;Aching;Sore Pain Intervention(s): Monitored during session;Premedicated before session;Repositioned    Home Living                      Prior Function            PT Goals (current goals can now be found in the care plan section)  Frequency  Min 5X/week    PT Plan      Co-evaluation             End of Session Equipment Utilized During Treatment: Back brace Activity Tolerance: Patient limited by fatigue;Patient limited by pain Patient left: in chair;with bed alarm set     Time: 9784-7841 PT Time Calculation (min) (ACUTE ONLY): 43 min  Charges:  $Gait Training: 8-22 mins $Therapeutic Activity: 23-37 mins                    G Codes:      Emily West  PTA [facility]  Acute  Rehab Pager      [phone]

## 2014-08-13 NOTE — Plan of Care (Signed)
Problem: Phase II Progression Outcomes Goal: Vital signs remain stable Outcome: Completed/Met Date Met:  08/13/14

## 2014-08-13 NOTE — Plan of Care (Signed)
Problem: Phase I Progression Outcomes Goal: Initial discharge plan identified Outcome: Completed/Met Date Met:  08/13/14     

## 2014-08-14 ENCOUNTER — Ambulatory Visit
Admit: 2014-08-14 | Discharge: 2014-08-14 | Disposition: A | Discharge status: Home / Self Care | Payer: BC Managed Care – PPO | Attending: Radiation Oncology | Admitting: Radiation Oncology

## 2014-08-14 ENCOUNTER — Inpatient Hospital Stay (HOSPITAL_COMMUNITY): Payer: BC Managed Care – PPO

## 2014-08-14 DIAGNOSIS — J9610 Chronic respiratory failure, unspecified whether with hypoxia or hypercapnia: Secondary | ICD-10-CM | POA: Insufficient documentation

## 2014-08-14 DIAGNOSIS — J449 Chronic obstructive pulmonary disease, unspecified: Secondary | ICD-10-CM

## 2014-08-14 DIAGNOSIS — M899 Disorder of bone, unspecified: Secondary | ICD-10-CM

## 2014-08-14 DIAGNOSIS — Z720 Tobacco use: Secondary | ICD-10-CM

## 2014-08-14 DIAGNOSIS — J9601 Acute respiratory failure with hypoxia: Secondary | ICD-10-CM

## 2014-08-14 DIAGNOSIS — C771 Secondary and unspecified malignant neoplasm of intrathoracic lymph nodes: Secondary | ICD-10-CM

## 2014-08-14 DIAGNOSIS — J810 Acute pulmonary edema: Secondary | ICD-10-CM

## 2014-08-14 DIAGNOSIS — Z853 Personal history of malignant neoplasm of breast: Secondary | ICD-10-CM

## 2014-08-14 DIAGNOSIS — J811 Chronic pulmonary edema: Secondary | ICD-10-CM

## 2014-08-14 DIAGNOSIS — E871 Hypo-osmolality and hyponatremia: Secondary | ICD-10-CM

## 2014-08-14 DIAGNOSIS — D72829 Elevated white blood cell count, unspecified: Secondary | ICD-10-CM

## 2014-08-14 LAB — BASIC METABOLIC PANEL
ANION GAP: 12 (ref 5–15)
ANION GAP: 14 (ref 5–15)
BUN: 13 mg/dL (ref 6–23)
BUN: 12 mg/dL (ref 6–23)
CALCIUM: 9.1 mg/dL (ref 8.4–10.5)
CO2: 24 mEq/L (ref 19–32)
CO2: 22 mEq/L (ref 19–32)
CREATININE: 0.93 mg/dL (ref 0.50–1.10)
Calcium: 9.1 mg/dL (ref 8.4–10.5)
Chloride: 92 mEq/L — ABNORMAL LOW (ref 96–112)
Chloride: 93 mEq/L — ABNORMAL LOW (ref 96–112)
Creatinine, Ser: 0.84 mg/dL (ref 0.50–1.10)
GFR calc non Af Amer: 65 mL/min — ABNORMAL LOW (ref >90)
GFR, EST AFRICAN AMERICAN: 76 mL/min — AB (ref >90)
GFR, EST AFRICAN AMERICAN: 86 mL/min — AB (ref >90)
GFR, EST NON AFRICAN AMERICAN: 74 mL/min — AB (ref >90)
Glucose, Bld: 104 mg/dL — ABNORMAL HIGH (ref 70–99)
Glucose, Bld: 110 mg/dL — ABNORMAL HIGH (ref 70–99)
POTASSIUM: 4.7 meq/L (ref 3.7–5.3)
Potassium: 4.1 mEq/L (ref 3.7–5.3)
SODIUM: 129 meq/L — AB (ref 137–147)
Sodium: 128 mEq/L — ABNORMAL LOW (ref 137–147)

## 2014-08-14 LAB — CBC
HCT: 30.5 % — ABNORMAL LOW (ref 36.0–46.0)
HCT: 31.4 % — ABNORMAL LOW (ref 36.0–46.0)
Hemoglobin: 10.2 g/dL — ABNORMAL LOW (ref 12.0–15.0)
Hemoglobin: 10.4 g/dL — ABNORMAL LOW (ref 12.0–15.0)
MCH: 29.5 pg (ref 26.0–34.0)
MCH: 29.1 pg (ref 26.0–34.0)
MCHC: 33.4 g/dL (ref 30.0–36.0)
MCHC: 33.1 g/dL (ref 30.0–36.0)
MCV: 88.2 fL (ref 78.0–100.0)
MCV: 88 fL (ref 78.0–100.0)
PLATELETS: 227 10*3/uL (ref 150–400)
Platelets: 215 10*3/uL (ref 150–400)
RBC: 3.46 MIL/uL — ABNORMAL LOW (ref 3.87–5.11)
RBC: 3.57 MIL/uL — ABNORMAL LOW (ref 3.87–5.11)
RDW: 13.6 % (ref 11.5–15.5)
RDW: 13.5 % (ref 11.5–15.5)
WBC: 14.4 10*3/uL — ABNORMAL HIGH (ref 4.0–10.5)
WBC: 14.6 10*3/uL — AB (ref 4.0–10.5)

## 2014-08-14 MED ORDER — DEXAMETHASONE 4 MG PO TABS: 8.0000 mg | ORAL_TABLET | Freq: Every day | ORAL | Status: DC

## 2014-08-14 MED ORDER — DEXAMETHASONE 4 MG PO TABS
8.0000 mg | ORAL_TABLET | Freq: Every day | ORAL | Status: DC
  Administered 2014-08-17 – 2014-08-21 (×5): 8 mg via ORAL
  Filled 2014-08-14 (×5): qty 2

## 2014-08-14 MED ORDER — FUROSEMIDE 10 MG/ML IJ SOLN
40.0000 mg | Freq: Once | INTRAMUSCULAR | Status: AC
  Administered 2014-08-14: 40 mg via INTRAVENOUS
  Filled 2014-08-14: qty 4

## 2014-08-14 MED ORDER — CYCLOBENZAPRINE HCL 10 MG PO TABS
10.0000 mg | ORAL_TABLET | Freq: Every day | ORAL | Status: DC
  Administered 2014-08-14: 10 mg via ORAL
  Filled 2014-08-14: qty 1

## 2014-08-14 MED ORDER — FUROSEMIDE 10 MG/ML IJ SOLN
40.0000 mg | Freq: Once | INTRAMUSCULAR | Status: AC
  Administered 2014-08-14: 40 mg via INTRAVENOUS
  Filled 2014-08-14 (×2): qty 4

## 2014-08-14 MED ORDER — CYCLOBENZAPRINE HCL 10 MG PO TABS
10.0000 mg | ORAL_TABLET | Freq: Three times a day (TID) | ORAL | Status: DC | PRN
  Administered 2014-08-15 – 2014-08-21 (×10): 10 mg via ORAL
  Filled 2014-08-14 (×13): qty 1

## 2014-08-14 NOTE — Progress Notes (Signed)
  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  NARRATIVE:  The patient was brought to the [facility].  Identity was confirmed.  All relevant records and images related to the planned course of therapy were reviewed.  The patient freely provided informed written consent to proceed with treatment after reviewing the details related to the planned course of therapy. The consent form was witnessed and verified by the simulation staff.  Then, the patient was set-up in a stable reproducible  supine position for radiation therapy.  A BodyFix immobilization pillow was fabricated for reproducible positioning.  Surface markings were placed.  The CT images were loaded into the planning software.  The gross target volumes (GTV) and planning target volumes (PTV) were delinieated, and avoidance structures were contoured.  Treatment planning then occurred.  The radiation prescription was entered and confirmed.  A total of two complex treatment devices were fabricated in the form of the BodyFix immobilization pillow and a neck accuform cushion.  I have requested : 3D Simulation  I have requested a DVH of the following structures: targets and all normal structures near the target as outlined on the radiation plan to maintain doses in adherence with established limits  PLAN:  The patient will receive 18 Gy in one fraction to T10 and L3.  ________________________________  [name] [name], M.D.

## 2014-08-14 NOTE — Progress Notes (Signed)
Physical Therapy Treatment Patient Details Name: Emily West [facility] MRN: [id number] DOB: April 19, 1951 Today's Date: 08/14/2014    History of Present Illness Pt s/p 2 weeks increasing back pain with pathologic fxs at T10 and L3    PT Comments    Many family members present during session.  Utilized session for family education on proper tech "log Roll" to get pt in/out of bed.  Instructed on proper tech to apply brace.  Instructed on need for O2.  Instructed on safe handling with pt using RW for optimal safety.  Instructed on time limit of pt sitting up.  Instructed on activity level, importance of increasing activity.   Pt progressing slowly with limited activity tolerance due to need for O2 and desat to low 70's on 3 lts.  Instructed pt on purse lip breathing and need to stop activity when SOB.   Follow Up Recommendations  Home health PT;Supervision/Assistance - 24 hour  Will need home O2     Equipment Recommendations  Rolling walker with 5" wheels;3in1 (PT) (daughter checking on RW, might already have one.)    Recommendations for Other Services       Precautions / Restrictions Precautions Precautions: Fall Precaution Comments: back Required Braces or Orthoses: Spinal Brace Spinal Brace: Thoracolumbosacral orthotic;Applied in sitting position Restrictions Weight Bearing Restrictions: No    Mobility  Bed Mobility Overal bed mobility: Needs Assistance Bed Mobility: Rolling;Sidelying to Sit;Supine to Sit Rolling: Min guard;Supervision Sidelying to sit: Min assist;Mod assist       General bed mobility comments: 25% VC's on proper "Log Roll" tech and increased assist for upper body.  Once EOB pt demon mod posterior lean/push and required several min to self adjust while appling back brace. demonstrated and eductaed family on how to properly apply brace.  Transfers Overall transfer level: Needs assistance Equipment used: Rolling walker (2 wheeled) Transfers: Sit to/from  Stand Sit to Stand: Min assist         General transfer comment: 25% VC's on safety with turns and hand placement esp with stand to sit to control decend.    Ambulation/Gait   Ambulation Distance (Feet): 22 Feet (amb distance limited by low O2 sats) Assistive device: Rolling walker (2 wheeled) Gait Pattern/deviations: Step-to pattern;Step-through pattern Gait velocity: decreased   General Gait Details: closely monitoring O2 sats pt on 3 lts pt was unable to achieve 90%.  MAX VXC's for purse lip breathing and hard blowing. Amb limited distance.  Pt admits she avoids deep breathing in fear of painful cough.     Stairs            Wheelchair Mobility    Modified Rankin (Stroke Patients Only)       Balance                                    Cognition                            Exercises      General Comments        Pertinent Vitals/Pain Pain Assessment: 0-10 Pain Score: 7  Pain Location: low back Pain Descriptors / Indicators: Stabbing Pain Intervention(s): Monitored during session;Repositioned    Home Living                      Prior Function  PT Goals (current goals can now be found in the care plan section) Progress towards PT goals: Progressing toward goals    Frequency  Min 5X/week    PT Plan      Co-evaluation             End of Session Equipment Utilized During Treatment: Back brace;Oxygen Activity Tolerance: Patient limited by fatigue;Treatment limited secondary to medical complications (Comment) (desat) Patient left: in chair;with bed alarm set     Time: 1552-0802 PT Time Calculation (min) (ACUTE ONLY): 47 min  Charges:  $Gait Training: 8-22 mins $Therapeutic Activity: 23-37 mins                    G Codes:      Rica Koyanagi  PTA [facility]  Acute  Rehab Pager      [phone]

## 2014-08-14 NOTE — Progress Notes (Signed)
OT Cancellation Note  Patient Details Name: Emily West [facility] MRN: [id number] DOB: 13-Nov-1950   Cancelled Treatment:    Reason Eval/Treat Not Completed: Patient at procedure or test/ unavailable, pt. Being prepped to go to x-ray.  Will check back as able.    Janice Coffin, COTA/L 08/14/2014, 9:28 AM

## 2014-08-14 NOTE — Progress Notes (Signed)
  Radiation Oncology         [phone] ________________________________  Name: Emily West [facility] MRN: [id number]  Date: 08/08/2014  DOB: June 22, 1951  Chart Note:  Pathology shows non-small cell carcinoma and the immunohistochemical profile is consistent with lung primary.  Given the radioresistance of non-small cell lung cancer to conventionally fractionated palliative spinal radiotherapy, single fraction stereotactic radiosurgery to T10 and L3 are medically necessary.  We will proceed with planning today, and possible treatment Wednesday in a single dose.  Dexamethasone on Wednesday should be considered to prevent a post-SRS pain flair.  ________________________________  [name]. [name], M.D.

## 2014-08-14 NOTE — Progress Notes (Signed)
SATURATION QUALIFICATIONS: (This note is used to comply with regulatory documentation for home oxygen)  Patient Saturations on Room Air at Rest = 82%  Patient Saturations on Room Air while Ambulating = unable to perform  Patient Saturations on 3 Liters of oxygen while Ambulating = 70-87%  Please briefly explain why patient needs home oxygen: pt unable to maintain therapeutic levels above 90% during activity.     Rica Koyanagi  PTA [facility]  Acute  Rehab Pager      [phone]

## 2014-08-14 NOTE — Progress Notes (Addendum)
PROGRESS NOTE  Emily West [facility] MWU:[id number] DOB: May 17, 1951 DOA: 08/08/2014 PCP: [name], MD  Summary: 62yow presented with 2 week h/o back pain. MRI revealed T10, L3 fxs as well as significant LAD. CT chest, abdomen, pelvis did not review primary tumor. Back pain was difficult to control blood has improved on combination long and short acting narcotics. 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 She was seen by radiation oncology with plans for radiation 12/2. Because of the possibility of epidural tumor, case was also discussed with neurosurgery with recommendations as below. No surgeries anticipated at this time. In the last 24 hours she has developed acute hypoxic respiratory failure with pulmonary edema of unclear etiology. Plans are for Lasix, 2-D echocardiogram and further monitoring. Once her condition is stabilized, anticipate short-term rehabilitation with outpatient follow-up.  Assessment/Plan: 1. 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 Lung etiology favored by pathology. New diagnosis. 2. Back pain secondary to pathologic T10, L3 fxs. No neurologic symptoms. Pain control improving. Physical therapy recommending SNF/CIR. 3. Acute hypoxic respiratory failure. Chest x-ray shows pulmonary edema. I/O balanced. No history of IV fluids for the last 4 days. No lower extremity edema. Etiology unclear. She does have a background of long-term smoking and COPD but no previous history of hypoxia. WBC is mildly elevated but has been since admission and is likely related to malignancy. She has had no fever or other signs or symptoms suggestive of infection. 4. Normocytic anemia. Significant change from admission, no evidence of bleeding. Suspect dilution from admission IV fluids. Plan to check anemia panel. 5. Hyponatremia likely  secondary to malignancy. 6. H/o left breast phylloides tumor. 7. COPD, remains stable. Continue Spiriva.  8. Tobacco dependence. Recommend cessation. 9. Asymptomatic. Constipation. Relieved. Continue aggressive bowel regimen.    Has not used IV pain medication and 24 hours. Pain overall improving and appears to be stabilizing on combination of long-acting and short-acting narcotics.  New issue is hypoxia over the last 24 hours which has become significant and is unclear etiology. Chest x-ray suggests pulmonary edema. No history of chest pain. No history of cardiac disease. I/O overall balanced. Please note IV fluid documented 6620 for the day of November 29. This an error. The patient had no order for IV fluids, this was confirmed with pharmacy. Patient also reports not receiving any IV fluids for the last several days which is concordant with chart  Plan diuresis with Lasix. Hypoxia stable. Check 2-D echocardiogram, BNP, repeat CXR in AM.  Continue to mobilize with physical therapy, TSLO brace. Up out of bed.  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 Patient remains without neuro symptoms as of 11/30. Per discussion with neurosurgery continue TSLO brace to stabilize T10, can ambulate ad lib when spine braced. May sleep and shower without brace. No indication for surgery.   Radiation oncology simulation conducted today. Plan for radiation therapy 12/2.  Oncology consultation pending for further recommendations.  Neuro checks q4 hours. Monitor for development of neurologic symptoms.  Code Status: full code DVT prophylaxis: SCDs Family Communication: Discussed in detail with husband, son, daughter-in-law at bedside. Disposition Plan: home when improved  [name], MD  [facility]  Pager [phone] If 7PM-7AM, please contact night-coverage at [email or web address], password [facility] 08/14/2014, 1:31 PM  LOS:  6 days   Consultants:  General surgery  Radiation oncology  Oncology  Procedures: 11/24 Right axilla lymph node biopsy.  Antibiotics:    HPI/Subjective: Feels tired today. She was able to walk with therapy today. She does feel short of breath which is new. She reports frequent urination after dose of Lasix today. She denies receiving any IV fluids in the last several days.  No neurologic symptoms. No prior stages. No weakness. No bowel or bladder dysfunction.  Objective: Filed Vitals:   08/13/14 1927 08/13/14 2109 08/14/14 0556 08/14/14 1016  BP:  116/49 126/56   Pulse:  94 86   Temp: 98.9 F (37.2 C) 98.8 F (37.1 C) 98 F (36.7 C)   TempSrc:  Oral Oral   Resp:  20 20   Height:      Weight:      SpO2:  89% 89% 90%    Intake/Output Summary (Last 24 hours) at 08/14/14 1331 Last data filed at 08/14/14 1000  Gross per 24 hour  Intake   6380 ml  Output    200 ml  Net   6180 ml     Filed Weights   08/09/14 0446  Weight: 100.9 kg (222 lb 7.1 oz)    Exam:     Afebrile, vital signs except hypoxia is new, 90% on 3 L.  Appears calm, comfortable. Appears fatigued. No acute distress. Nontoxic.  Cardiovascular. Regular rate and rhythm. No murmur, rub or gallop. No lower extremity edema.  Respiratory fair air movement. No frank wheezes, rales or rhonchi. Mild increased respiratory effort. Speaks in full sentences.  Psychiatric. Grossly normal mentation. Alert. Speech fluent and clear.  Data Reviewed:  Multiple voids. BM 2.  Intake IV charted at 6620. There is not order for IVF nor was there yesterday. RN yesterday not available. Patient denies IVF yesterday.  Sodium slightly lower, 129. Chloride low dehydration, 93. Creatinine unremarkable.  WBC without significant change, 14.6.  Hemoglobin 10.4, change from admission hemoglobin 14.1.  Scheduled Meds: . ARIPiprazole  2 mg Oral Daily  . buPROPion  750 mg Oral Daily  . cyclobenzaprine  10 mg Oral QHS    . FLUoxetine  20 mg Oral TID  . nicotine  14 mg Transdermal Daily  . OxyCODONE  20 mg Oral Q12H  . pantoprazole  40 mg Oral Q2000  . polyethylene glycol  17 g Oral BID  . senna  1 tablet Oral QHS  . simvastatin  40 mg Oral QHS  . tiotropium  18 mcg Inhalation Daily   Continuous Infusions:    Principal Problem:   Pathologic fracture of vertebra Active Problems:   Lymphadenopathy, mediastinal   HTN (hypertension)   History of lump of left breast   Non-traumatic compression fracture of vertebral column   Hypoxia   Time spent 45 minutes, greater than 50% in counseling and coordination of care.

## 2014-08-14 NOTE — Consult Note (Signed)
[place]  Telephone:([phone]) [place] NOTE  Emily West [facility]                                MR#: [id number]  DOB: [birth year]                       CSN#: [id number]  Referring MD: Dr. Sheliah Plane [facility]  Primary MD: Dr. Arvid Right FP  Reason for Consult: Lung Cancer  GBT:DVVOHYW K Goggins is a 63 y.o. female admitted with progressive, 2 week history of severe back pain not amenable to outpatient management with steroids and pain medicines. She denied any other symptoms associated with this pain such as numbness, tingling or weakness in her extremities. She denied cardiac complaints  She does have dyspnea on exertion and has been desaturating while in the hospital, requiring Oxygen, in the setting of COPD. She denied any weight loss. Patient has ongoing tobacco history. MRI of the Thoracic and Lumbar spine ED showed mediastinal lymphadenopathy worrisome for metastatic disease as well as T10 and L3 pathologic fractures. It also showed small amount of epidural tumor without cord compression or abnormal intradural enhancement. CT chest/abdomen/pelvis with contrast which showed again extensive lymphadenopathy.  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  Surgery was consulted for right lymph node biopsy on 08/09/14. Path results, case number [id number] was consistent with metastatic, poorly differentiated non-small cell carcinoma. Immunohistochemistry shows positivity with cytokeratin AE1/AE3, cytokeratin 903, thyroid transcription,factor-1, p16 and patchy positivity with p63. The tumor is negative with estrogen receptor, progesterone receptor,cytokeratin 5/6, gross cystic disease fluid protein and Napsin-A. Likely primary is Lung. Radiation Oncology consulted on 11/30, with plans to proceed with single fraction stereotactic radiosurgery to T10 and L3 under the direction of Dr.  [name], to start on 12/2. She is on Dexamethasone. We were requested to see this patient with recommendations.        PMH:  Past Medical History  Diagnosis Date  . History of suicidal ideation   . HTN (hypertension)   . HLD (hyperlipidemia)   . Shortness of breath   . Anxiety and depression   . Menopause   . Benign hematuria   . Impaired fasting glucose   . Other abnormal glucose     Surgeries:  Past Surgical History  Procedure Laterality Date  . Colonoscopy  11/21/2003     rare early left-sided diverticula remaining, low sigmoid anastomosis, internal and external hemorrhoids,   . Cystoscopy    . Lymph node biopsy Right 08/09/2014    Procedure: Right axillary lymph node biopsy;  Surgeon: Alphonsa Overall, MD;  Location: [facility];  Service: General;  Laterality: Right;    Allergies: No Known Allergies  Medications:   Scheduled Meds: . ARIPiprazole  2 mg Oral Daily  . buPROPion  750 mg Oral Daily  . cyclobenzaprine  10 mg Oral QHS  . [START ON 08/17/2014] dexamethasone  8 mg Oral Daily  . FLUoxetine  20 mg Oral TID  . nicotine  14 mg Transdermal Daily  . OxyCODONE  20 mg Oral Q12H  . pantoprazole  40 mg Oral Q2000  . polyethylene glycol  17 g Oral BID  . senna  1 tablet Oral QHS  . simvastatin  40 mg Oral QHS  . tiotropium  18 mcg Inhalation Daily  Continuous Infusions:  PRN Meds:.albuterol, HYDROmorphone (DILAUDID) injection, ondansetron, oxyCODONE, zolpidem   ROS: Constitutional: Denies fevers, chills or abnormal night sweats Eyes: Denies blurriness of vision, double vision or watery eyes Ears, nose, mouth, throat, and face: Denies mucositis or sore throat Respiratory: Denies cough, positive for dyspnea on exertion requiring O2. No wheezes Cardiovascular: Denies palpitation, chest discomfort or lower extremity swelling Gastrointestinal:  Denies nausea, heartburn or change in bowel habits Skin: Denies abnormal skin rashes Lymphatics: Denies new lymphadenopathy or  easy bruising Neurological: Denies numbness, tingling or new weaknesses Musculoskeletal" severe back pain as per HPI Behavioral/Psych: Mood is stable, no new changes  All other systems were reviewed with the patient and are negative.   Family History:    Family History  Problem Relation Age of Onset  . Emphysema Father   . Depression Mother   . Glaucoma Mother   . Thyroid disease Mother   . CVA Mother   . Breast cancer Mother   . Colon cancer Maternal Grandmother   . CVA Brother 40    1/3  . Thyroid disease Sister     1/2, partial thyroidectomy  . Goiter Sister     2/2    Social History:  reports that she has been smoking.  She started smoking about 36 years ago. She does not have any smokeless tobacco history on file. She reports that she drinks alcohol. She reports that she does not use illicit drugs.  Married, 2 children in good health. Lives in [place]. Works in the Medical sales representative at [employer]. Married   Physical Exam   ECOG PERFORMANCE STATUS:  1 Symptomatic but completely ambulatory (Restricted in physically strenuous activity but ambulatory and able to carry out work of a light or sedentary nature. For example, light housework, office work)   Scientist, forensic Vitals:   08/14/14 0556  BP: 126/56  Pulse: 86  Temp: 98 F (36.7 C)  Resp: 20   Filed Weights   08/09/14 0446  Weight: 222 lb 7.1 oz (100.9 kg)    GENERAL:alert, no distress and uncomfortable due to pain SKIN: skin color, texture, turgor are normal, no rashes or significant lesions EYES: normal, conjunctiva are pink and non-injected, sclera clear OROPHARYNX:no exudate, no erythema and lips, buccal mucosa, and tongue normal  NECK: supple, thyroid normal size, non-tender, without nodularity LYMPH:  no palpable lymphadenopathy in the cervical, axillary or inguinal LUNGS: clear to auscultation and percussion with normal breathing effort HEART: regular rate & rhythm and no murmurs and no lower  extremity edema ABDOMEN:abdomen soft, non-tender and normal bowel sounds Musculoskeletal:no cyanosis of digits and no clubbing  PSYCH: alert & oriented x 3 with fluent speech NEURO: no focal motor/sensory deficits  CBC  Recent Labs Lab 08/08/14 0951 08/08/14 1540 08/14/14 0520 08/14/14 1045  WBC 15.0*  --  14.4* 14.6*  HGB 14.1 13.9 10.2* 10.4*  HCT 41.9 41.0 30.5* 31.4*  PLT 335  --  227 215  MCV 90.7  --  88.2 88.0  MCH 30.5  --  29.5 29.1  MCHC 33.7  --  33.4 33.1  RDW 13.7  --  13.6 13.5  LYMPHSABS 2.7  --   --   --   MONOABS 1.1*  --   --   --   EOSABS 0.1  --   --   --   BASOSABS 0.0  --   --   --     Anemia panel:  No results for input(s): VITAMINB12, FOLATE, FERRITIN, TIBC,  IRON, RETICCTPCT in the last 72 hours.  CMP    Recent Labs Lab 08/08/14 0951 08/08/14 1540 08/14/14 0520 08/14/14 1045  NA 135* 133* 128* 129*  K 5.8* 4.2 4.1 4.7  CL 97 100 92* 93*  CO2 23  --  24 22  GLUCOSE 106* 122* 104* 110*  BUN 18 18 13 12   CREATININE 0.85 0.80 0.93 0.84  CALCIUM 9.6  --  9.1 9.1        Component Value Date/Time   BILITOT 0.7 03/06/2007 0140     No results for input(s): INR, PROTIME in the last 168 hours.  No results for input(s): DDIMER in the last 72 hours.  Imaging Studies:  Dg Chest 2 View  08/14/2014   CLINICAL DATA:  Hypoxia.  EXAM: CHEST  2 VIEW  COMPARISON:  CT scan of August 08, 2014.  FINDINGS: Borderline cardiomegaly is noted. Bilateral perihilar interstitial densities are noted concerning for edema. Minimal bilateral pleural effusions are noted posteriorly. No pneumothorax is noted. Bony thorax appears intact.  IMPRESSION: Bilateral perihilar edema is noted with minimal associated pleural effusions.   Electronically Signed   By: Sabino Dick M.D.   On: 08/14/2014 09:53   Dg Lumbar Spine Complete  08/04/2014   CLINICAL DATA:  Low back pain, unspecified back pain lateral laterality, with sciatica presence unspecified.  EXAM: LUMBAR  SPINE - COMPLETE 4+ VIEW  COMPARISON:  None.  FINDINGS: Diffuse osteopenia is noted. No fracture or spondylolisthesis is noted. Atherosclerotic calcifications of abdominal aorta are noted. Disc spaces are well-maintained. Posterior facet joints appear normal. Mild dextroscoliosis of upper lumbar spine is noted.  IMPRESSION: No acute abnormality seen in the lumbar spine.   Electronically Signed   By: Sabino Dick M.D.   On: 08/04/2014 15:47   Ct Chest W Contrast  08/08/2014   CLINICAL DATA:  Mediastinal lymphadenopathy on thoracic spine MRI. History of breast cancer.  EXAM: CT CHEST, ABDOMEN, AND PELVIS WITH CONTRAST  TECHNIQUE: Multidetector CT imaging of the chest, abdomen and pelvis was performed following the standard protocol during bolus administration of intravenous contrast.  CONTRAST:  143mL OMNIPAQUE IOHEXOL 300 MG/ML  SOLN  COMPARISON:  Thoracic and lumbar spine MRI dated 08/08/2014  FINDINGS: CT CHEST FINDINGS  Mild patchy/ground-glass opacities in the left upper lobe (series 5/image 15) and right upper lobe (for example, series 5/ image 18), suspicious for mild pneumonia.  Mild dependent atelectasis in the bilateral lower lobes. No pleural effusion or pneumothorax.  Visualized thyroid is unremarkable.  Heart is normal in size. No pericardial effusion. Mild atherosclerotic calcifications of the aortic arch.  Thoracic lymphadenopathy, including:  --12 mm short axis right supraclavicular node (series 2/ image 1)  --26 mm short axis high right paratracheal node (series 2/ image 12)  --17 mm short axis right axillary node  --22 mm short axis partially necrotic subcarinal node (series 2/image 26)  Status post left mastectomy with reconstruction.  Mild superior endplate compression fracture deformity at T10 with associated underlying lytic lesion along the posterior vertebral body (sagittal image 59), better evaluated on MRI. Mild retropulsion.  CT ABDOMEN AND PELVIS FINDINGS  Hepatobiliary: Liver is within  normal limits. No suspicious/enhancing hepatic lesions.  Layering gallstones (series 2/image 54), without associated inflammatory changes.  Pancreas: Within normal limits.  Spleen: Within normal limits.  Adrenals/Urinary Tract: Adrenal glands are unremarkable.  Kidneys are within normal limits.  No hydronephrosis.  Bladder is notable for layering excretory MRI contrast.  Stomach/Bowel: Moderate hiatal hernia. Stomach  is otherwise within normal limits.  No evidence of bowel obstruction.  Suspected prior appendectomy.  Prior distal colonic resection with anastomosis in the left pelvis (series 2/image 94).  Vascular/Lymphatic: Atherosclerotic calcifications of the abdominal aorta and branch vessels.  No suspicious abdominopelvic lymphadenopathy.  Reproductive: Status post hysterectomy.  No adnexal masses.  Other: No abdominopelvic ascites.  Musculoskeletal: Mild superior endplate compression fracture deformity at L3 with associated lytic lesion along the anterior vertebral body (sagittal image 27). No retropulsion.  Lucencies involving the sacrum likely reflect focal fat when correlating with MRI.  IMPRESSION: Thoracic lymphadenopathy, including right supraclavicular and axillary lymphadenopathy, as above. Primary differential considerations include nonvisualized (right) breast or lung cancer, or possibly lymphoma.  No suspicious lymphadenopathy in the abdomen or pelvis.  Pathologic fractures involving T10 and L3, better evaluated on MRI.   Electronically Signed   By: Julian Hy M.D.   On: 08/08/2014 15:09   Mr Thoracic Spine W Wo Contrast  08/08/2014   CLINICAL DATA:  Progressive low back pain over the last 2 weeks with radiation into the leg and difficulty ambulating. History of breast cancer. Initial encounter.  EXAM: MRI THORACIC AND LUMBAR SPINE WITHOUT AND WITH CONTRAST  TECHNIQUE: Multiplanar and multiecho pulse sequences of the thoracic and lumbar spine were obtained without and with intravenous  contrast.  CONTRAST:  56mL MULTIHANCE GADOBENATE DIMEGLUMINE 529 MG/ML IV SOLN  COMPARISON:  Lumbar spine radiographs 08/04/2014.  FINDINGS: MR THORACIC SPINE FINDINGS  Sagittal localizing images demonstrate no definite abnormalities within the cervical spine. There is diffuse replacement of the normal marrow signal within the T10 vertebral body associated with a mild superior endplate compression deformity and mild osseous retropulsion on the right. Post-contrast, there is diffuse enhancement of the T10 vertebral body. Appearance is highly worrisome for metastatic disease with a pathologic fracture. There may be a small amount of enhancing epidural tumor on the right. No cord compression is present.  No other osseous metastases or fractures are seen within the thoracic spine.  The thoracic cord is normal in signal and caliber. There is no abnormal intradural enhancement.  There is extensive mediastinal lymphadenopathy. There is a large right paratracheal mass measuring 4.5 x 3.5 cm. There is a subcarinal nodal mass measuring 2.4 x 3.5 cm. There is probable lower esophageal adenopathy as well.  There is minimal thoracic spondylosis without disc herniation or foraminal compromise.  MR LUMBAR SPINE FINDINGS  There are 5 lumbar type vertebral bodies. There is superior endplate Schmorl's node at L3. Bone marrow edema and enhancement are present throughout the L3 vertebral body. There is no osseous retropulsion or epidural tumor. Although less concerning than the findings at T10, this likely represents metastatic disease as well.  No other osseous metastases or fractures are seen within the lumbar spine.  The conus medullaris extends to the L1-2 disc space level. There is no abnormal intradural enhancement. No paraspinal mass is demonstrated.  There is mild lumbar spondylosis. At L2-3 and L3-4, there is mild disc bulging without resulting spinal stenosis or nerve root encroachment. At L4-5, the disc bulging is eccentric  laterally to the left but does not causes any L4 nerve root encroachment. Disc degeneration and osteophytes at L5-S1 are asymmetric to the right. There are mild facet degenerative changes throughout the lower lumbar spine.  IMPRESSION: 1. Mediastinal lymphadenopathy highly worrisome for metastatic disease. This pattern is atypical for metastatic breast cancer and is more suggestive of lung cancer or possibly lymphoma. CT (at least of the chest;  consider including the abdomen and pelvis) recommended for further evaluation. 2. Fractures at T10 and L3 are both suspicious for pathologic fractures from underlying metastatic disease. There is possibly a small amount of epidural tumor associated with the T10 fracture. No cord compression or abnormal intradural enhancement identified. 3. These results were called by telephone at the time of interpretation on 08/08/2014 at 12:14 pm to Dr. Joseph Berkshire , who verbally acknowledged these results.   Electronically Signed   By: Camie Patience M.D.   On: 08/08/2014 12:16   Mr Lumbar Spine W Wo Contrast  08/08/2014   CLINICAL DATA:  Progressive low back pain over the last 2 weeks with radiation into the leg and difficulty ambulating. History of breast cancer. Initial encounter.  EXAM: MRI THORACIC AND LUMBAR SPINE WITHOUT AND WITH CONTRAST  TECHNIQUE: Multiplanar and multiecho pulse sequences of the thoracic and lumbar spine were obtained without and with intravenous contrast.  CONTRAST:  91mL MULTIHANCE GADOBENATE DIMEGLUMINE 529 MG/ML IV SOLN  COMPARISON:  Lumbar spine radiographs 08/04/2014.  FINDINGS: MR THORACIC SPINE FINDINGS  Sagittal localizing images demonstrate no definite abnormalities within the cervical spine. There is diffuse replacement of the normal marrow signal within the T10 vertebral body associated with a mild superior endplate compression deformity and mild osseous retropulsion on the right. Post-contrast, there is diffuse enhancement of the T10  vertebral body. Appearance is highly worrisome for metastatic disease with a pathologic fracture. There may be a small amount of enhancing epidural tumor on the right. No cord compression is present.  No other osseous metastases or fractures are seen within the thoracic spine.  The thoracic cord is normal in signal and caliber. There is no abnormal intradural enhancement.  There is extensive mediastinal lymphadenopathy. There is a large right paratracheal mass measuring 4.5 x 3.5 cm. There is a subcarinal nodal mass measuring 2.4 x 3.5 cm. There is probable lower esophageal adenopathy as well.  There is minimal thoracic spondylosis without disc herniation or foraminal compromise.  MR LUMBAR SPINE FINDINGS  There are 5 lumbar type vertebral bodies. There is superior endplate Schmorl's node at L3. Bone marrow edema and enhancement are present throughout the L3 vertebral body. There is no osseous retropulsion or epidural tumor. Although less concerning than the findings at T10, this likely represents metastatic disease as well.  No other osseous metastases or fractures are seen within the lumbar spine.  The conus medullaris extends to the L1-2 disc space level. There is no abnormal intradural enhancement. No paraspinal mass is demonstrated.  There is mild lumbar spondylosis. At L2-3 and L3-4, there is mild disc bulging without resulting spinal stenosis or nerve root encroachment. At L4-5, the disc bulging is eccentric laterally to the left but does not causes any L4 nerve root encroachment. Disc degeneration and osteophytes at L5-S1 are asymmetric to the right. There are mild facet degenerative changes throughout the lower lumbar spine.  IMPRESSION: 1. Mediastinal lymphadenopathy highly worrisome for metastatic disease. This pattern is atypical for metastatic breast cancer and is more suggestive of lung cancer or possibly lymphoma. CT (at least of the chest; consider including the abdomen and pelvis) recommended for  further evaluation. 2. Fractures at T10 and L3 are both suspicious for pathologic fractures from underlying metastatic disease. There is possibly a small amount of epidural tumor associated with the T10 fracture. No cord compression or abnormal intradural enhancement identified. 3. These results were called by telephone at the time of interpretation on 08/08/2014 at 12:14 pm to  Dr. Joseph Berkshire , who verbally acknowledged these results.   Electronically Signed   By: Camie Patience M.D.   On: 08/08/2014 12:16   Ct Abdomen Pelvis W Contrast  08/08/2014   CLINICAL DATA:  Mediastinal lymphadenopathy on thoracic spine MRI. History of breast cancer.  EXAM: CT CHEST, ABDOMEN, AND PELVIS WITH CONTRAST  TECHNIQUE: Multidetector CT imaging of the chest, abdomen and pelvis was performed following the standard protocol during bolus administration of intravenous contrast.  CONTRAST:  127mL OMNIPAQUE IOHEXOL 300 MG/ML  SOLN  COMPARISON:  Thoracic and lumbar spine MRI dated 08/08/2014  FINDINGS: CT CHEST FINDINGS  Mild patchy/ground-glass opacities in the left upper lobe (series 5/image 15) and right upper lobe (for example, series 5/ image 18), suspicious for mild pneumonia.  Mild dependent atelectasis in the bilateral lower lobes. No pleural effusion or pneumothorax.  Visualized thyroid is unremarkable.  Heart is normal in size. No pericardial effusion. Mild atherosclerotic calcifications of the aortic arch.  Thoracic lymphadenopathy, including:  --12 mm short axis right supraclavicular node (series 2/ image 1)  --26 mm short axis high right paratracheal node (series 2/ image 12)  --17 mm short axis right axillary node  --22 mm short axis partially necrotic subcarinal node (series 2/image 26)  Status post left mastectomy with reconstruction.  Mild superior endplate compression fracture deformity at T10 with associated underlying lytic lesion along the posterior vertebral body (sagittal image 59), better evaluated on  MRI. Mild retropulsion.  CT ABDOMEN AND PELVIS FINDINGS  Hepatobiliary: Liver is within normal limits. No suspicious/enhancing hepatic lesions.  Layering gallstones (series 2/image 54), without associated inflammatory changes.  Pancreas: Within normal limits.  Spleen: Within normal limits.  Adrenals/Urinary Tract: Adrenal glands are unremarkable.  Kidneys are within normal limits.  No hydronephrosis.  Bladder is notable for layering excretory MRI contrast.  Stomach/Bowel: Moderate hiatal hernia. Stomach is otherwise within normal limits.  No evidence of bowel obstruction.  Suspected prior appendectomy.  Prior distal colonic resection with anastomosis in the left pelvis (series 2/image 94).  Vascular/Lymphatic: Atherosclerotic calcifications of the abdominal aorta and branch vessels.  No suspicious abdominopelvic lymphadenopathy.  Reproductive: Status post hysterectomy.  No adnexal masses.  Other: No abdominopelvic ascites.  Musculoskeletal: Mild superior endplate compression fracture deformity at L3 with associated lytic lesion along the anterior vertebral body (sagittal image 27). No retropulsion.  Lucencies involving the sacrum likely reflect focal fat when correlating with MRI.  IMPRESSION: Thoracic lymphadenopathy, including right supraclavicular and axillary lymphadenopathy, as above. Primary differential considerations include nonvisualized (right) breast or lung cancer, or possibly lymphoma.  No suspicious lymphadenopathy in the abdomen or pelvis.  Pathologic fractures involving T10 and L3, better evaluated on MRI.   Electronically Signed   By: Julian Hy M.D.   On: 08/08/2014 15:09   Patient: Emily West, Emily West Collected: 08/09/2014 Client: [facility] Accession: [id number] Received: 08/09/2014 Alphonsa Overall DOB: January 05, 1951 Age: 53 Gender: F Reported: 08/14/2014 [address] Patient Ph: [phone] MRN #: [id number] [place], [place] [address] Visit #: [id number].[place] Chart #: Phone:  [phone] Fax: CC: REPORT OF SURGICAL PATHOLOGY FINAL DIAGNOSIS Diagnosis Lymph node for lymphoma METASTATIC POORLY DIFFERENTIATED NON-SMALL CELL CARCINOMA. Microscopic Comment The tumor is characterized by marked nuclear pleomorphism, abundant eosinophilic cytoplasm and frequent mitotic figures. Immunohistochemistry shows positivity with cytokeratin AE1/AE3, cytokeratin 903, thyroid transcription factor-1, p16 and patchy positivity with p63. The tumor is negative with estrogen receptor, progesterone receptor, cytokeratin 5/6, gross cystic disease fluid protein and Napsin-A. The differential diagnosis includes metastatic  lung non-small cell carcinoma. Estrogen receptor, progesterone receptor and gross cystic disease fluid protein are negative; however, while considered less likely, metastatic breast carcinoma cannot be ruled out. Drs. Hillard and Lyndon Code have reviewed this and agree. (JDP:kh 08-14-14) Claudette Laws MD Pathologist, [employer]   A/P: 63 y.o. female with New Diagnosis of Metastatic Poorly Differentiated Non-Small Cell Carcinoma, likely lung primary s/p right axillary lymph node biopsy on 08/09/2014 Simulation performed today, for radiation to the bone lesions at T10 and L3 under the direction of Dr. [name] to start on 12/2 Continue Decadron as directed. Plans to receive chemotherapy are being entertained. Will continue to follow with you. Thank you for the opportunity to participate in the care of this nice patient  History of left breast phylloides tumor, s/p remote excision and reconstruction in the 1990's.  History of tobacco abuse tobacco cessation recommended with Nicotine patch   COPD Patient had episodes of desaturation with ambulation. She may need O2 at home  Appreciate OT/PT involvement  Leukocytosis Secondary to steroids and COPD Continue to monitor  Hyponatremia In the setting of malignancy.  Appreciate primary team involvement  Rondel Jumbo, PA-C 08/14/2014 2:51 PM  ADDENDUM: Hematology/Oncology Attending: The patient is seen and examined. I agree with the above note. This is a very pleasant 63 years old white female with long history of smoking and past medical history significant for hypertension. She presented for evaluation of severe back pain started around 10 days ago. She had MRI of the thoracolumbar spine on 08/08/2014 and it showed mediastinal lymphadenopathy highly worrisome for metastatic disease. The pattern is atypical for metastatic breast cancer and more suggestive of lung cancer or possibly lymphoma. There was fractures at T10 and L3 post suspicious for pathologic fractures from underlying metastatic disease. There was possibly a small amount of epidural tumor associated with the T10 fracture with no cord compression or abnormal intradural enhancement identified. CT of the abdomen and pelvis was performed on 08/08/2014 showed Thoracic lymphadenopathy, including right supraclavicular and axillary lymphadenopathy. Primary differential considerations include nonvisualized (right) breast or lung cancer, or possibly lymphoma. No suspicious lymphadenopathy in the abdomen or pelvis. Pathologic fractures involving T10 and L3, better evaluated on MRI. On 08/09/2014 the patient underwent right axilla lymph node biopsy by Dr. Lucia Gaskins. The final pathology (Accession: [id number]) was consistent with metastatic poorly differentiated non-small cell carcinoma. The tumor is characterized by marked nuclear pleomorphism, abundant eosinophilic cytoplasm and frequent mitotic figures. Immunohistochemistry shows positivity with cytokeratin AE1/AE3, cytokeratin 903, thyroid transcription  factor-1, p16 and patchy positivity with p63. The tumor is negative with estrogen receptor, progesterone receptor, cytokeratin 5/6, gross cystic disease fluid protein and Napsin-A. The differential diagnosis includes metastatic lung non-small cell carcinoma. Estrogen  receptor, progesterone receptor and gross cystic disease fluid protein are negative; however, while considered less likely, metastatic breast carcinoma cannot be ruled out. The patient was seen by Dr. [name] and expected to start palliative radiotherapy with [facility] on 08/17/2014. I was ask to see the patient today for evaluation and discussion of her treatment options. When seen today she continues to complain of the back pain and she is currently on OxyContin 20 mg by mouth every 12 hours in addition to OxyIR 15 mg by mouth every 4 hours as needed for pain. She also continues to have muscle spasm. She has shortness of breath but no significant cough or hemoptysis and no chest pain. Assessment and plan: Very pleasant 63 years old white female with metastatic poorly differentiated non-small cell  carcinoma highly suspicious for primary lung cancer but primary breast cancer cannot be excluded at this point. I had a lengthy discussion with the patient and her family and they showed them the images of the scans. 1) I will complete the staging workup by ordering MRI of the brain to rule out brain metastasis. I would also consider the patient for a PET scan on outpatient basis after discharge as well as mammogram to rule out the possibility of breast cancer. 2) I will consider sending her tissue block to be tested for molecular markers. 3) I agree with the current recommendation regarding palliative radiotherapy to the T10 vertebral lesion. She may also benefit from palliative radiotherapy to the L3 lesion. 4) I discussed with the patient her treatment options including palliative care and hospice referral versus consideration of systemic chemotherapy. I may consider her for treatment with carboplatin, paclitaxel and Avastin or alternatively with carboplatin and Alimta. I explained to the patient and her family that she has incurable condition and all the treatment will be of palliative nature. I will arrange  for the patient in follow-up appointment with me at the [facility] after discharge for more detailed discussion of her treatment options and adverse effects. The patient and her family have full understanding of her current condition and the treatment options. Thank you so much for allowing me to participate in the care of Mrs. Stipp. I will continue to follow up the patient with you and assist in her management on as-needed basis.

## 2014-08-14 NOTE — Progress Notes (Signed)
Occupational Therapy Treatment Patient Details Name: Emily [facility] MRN: [id number] DOB: 05-Jul-1951 Today's Date: 08/14/2014    History of present illness Pt s/p 2 weeks increasing back pain with pathologic fxs at T10 and L3   OT comments  Pt needs post acute rehab prior to Dc home  Follow Up Recommendations  CIR (pt has a solid DC plan)    Equipment Recommendations  3 in 1 bedside comode;Tub/shower seat;Hospital bed;Tub/shower bench       Precautions / Restrictions Precautions Precautions: Fall Precaution Comments: back Required Braces or Orthoses: Spinal Brace Spinal Brace: Thoracolumbosacral orthotic;Applied in sitting position Restrictions Weight Bearing Restrictions: No       Mobility Bed Mobility Overal bed mobility: Needs Assistance Bed Mobility: Rolling;Sidelying to Sit;Supine to Sit Rolling: Min guard;Supervision Sidelying to sit: Min assist;Mod assist       General bed mobility comments: 25% VC's on proper "Log Roll" tech and increased assist for upper body.  Once EOB pt demon mod posterior lean/push and required several min to self adjust while appling back brace. demonstrated and eductaed family on how to properly apply brace.  Transfers Overall transfer level: Needs assistance Equipment used: Rolling walker (2 wheeled) Transfers: Sit to/from Stand Sit to Stand: Min assist         General transfer comment: 25% VC's on safety with turns and hand placement esp with stand to sit to control decend.      Balance                                   ADL Overall ADL's : Needs assistance/impaired                                       General ADL Comments: Very lengthy discussion with pt, husband, son and daughter in law regarding ADL activty with brace and pain.  Educated family in Dc options.  Pt and family want pt to be comfortable- yet as I as possible at home. Pain is limiting as well as brace.  Explained ,DC options  and venues.  Patient would benefit from Inpatient rehab      Vision                                          Pertinent Vitals/ Pain       Pain Assessment: 0-10 Pain Score: 7  Pain Location: low back Pain Descriptors / Indicators: Sharp Pain Intervention(s): Monitored during session;Repositioned;Patient requesting pain meds-RN notified  Home Living                                              Progress Toward Goals  OT Goals(current goals can now be found in the care plan section)        Plan Discharge plan needs to be updated    Co-evaluation                 End of Session     Activity Tolerance Patient limited by pain   Patient Left in bed;with family/visitor present;with nursing/sitter in room   Nurse Communication Mobility status  Time: 1225-1315 OT Time Calculation (min): 50 min  Charges: OT General Charges $OT Visit: 1 Procedure OT Treatments $Self Care/Home Management : 38-52 mins  Elfego Giammarino, Thereasa Parkin 08/14/2014, 1:25 PM

## 2014-08-15 ENCOUNTER — Inpatient Hospital Stay (HOSPITAL_COMMUNITY): Payer: BC Managed Care – PPO

## 2014-08-15 DIAGNOSIS — C7931 Secondary malignant neoplasm of brain: Secondary | ICD-10-CM

## 2014-08-15 DIAGNOSIS — I517 Cardiomegaly: Secondary | ICD-10-CM

## 2014-08-15 LAB — IRON AND TIBC
Iron: <10 ug/dL — ABNORMAL LOW (ref 42–135)
UIBC: 198 ug/dL (ref 125–400)

## 2014-08-15 LAB — BASIC METABOLIC PANEL
ANION GAP: 16 — AB (ref 5–15)
BUN: 17 mg/dL (ref 6–23)
CALCIUM: 9.1 mg/dL (ref 8.4–10.5)
CO2: 25 meq/L (ref 19–32)
CREATININE: 1.07 mg/dL (ref 0.50–1.10)
Chloride: 92 mEq/L — ABNORMAL LOW (ref 96–112)
GFR calc Af Amer: 64 mL/min — ABNORMAL LOW (ref >90)
GFR, EST NON AFRICAN AMERICAN: 55 mL/min — AB (ref >90)
Glucose, Bld: 102 mg/dL — ABNORMAL HIGH (ref 70–99)
Potassium: 3.5 mEq/L — ABNORMAL LOW (ref 3.7–5.3)
Sodium: 133 mEq/L — ABNORMAL LOW (ref 137–147)

## 2014-08-15 LAB — CBC
HCT: 32.2 % — ABNORMAL LOW (ref 36.0–46.0)
Hemoglobin: 10.5 g/dL — ABNORMAL LOW (ref 12.0–15.0)
MCH: 28.8 pg (ref 26.0–34.0)
MCHC: 32.6 g/dL (ref 30.0–36.0)
MCV: 88.5 fL (ref 78.0–100.0)
PLATELETS: 252 10*3/uL (ref 150–400)
RBC: 3.64 MIL/uL — ABNORMAL LOW (ref 3.87–5.11)
RDW: 13.7 % (ref 11.5–15.5)
WBC: 14 10*3/uL — ABNORMAL HIGH (ref 4.0–10.5)

## 2014-08-15 LAB — RETICULOCYTES
RBC.: 3.64 MIL/uL — AB (ref 3.87–5.11)
RETIC CT PCT: 1.8 % (ref 0.4–3.1)
Retic Count, Absolute: 65.5 10*3/uL (ref 19.0–186.0)

## 2014-08-15 LAB — VITAMIN B12: Vitamin B-12: 690 pg/mL (ref 211–911)

## 2014-08-15 LAB — FOLATE: Folate: 4 ng/mL

## 2014-08-15 LAB — FERRITIN: Ferritin: 316 ng/mL — ABNORMAL HIGH (ref 10–291)

## 2014-08-15 LAB — PRO B NATRIURETIC PEPTIDE: Pro B Natriuretic peptide (BNP): 114.8 pg/mL (ref 0–125)

## 2014-08-15 MED ORDER — OXYCODONE HCL ER 20 MG PO T12A
40.0000 mg | EXTENDED_RELEASE_TABLET | Freq: Two times a day (BID) | ORAL | Status: DC
  Administered 2014-08-15 – 2014-08-21 (×12): 40 mg via ORAL
  Filled 2014-08-15 (×12): qty 2

## 2014-08-15 MED ORDER — FUROSEMIDE 10 MG/ML IJ SOLN: 20.0000 mg | Freq: Two times a day (BID) | INTRAMUSCULAR | Status: DC

## 2014-08-15 MED ORDER — BUPROPION HCL ER (XL) 300 MG PO TB24
450.0000 mg | ORAL_TABLET | Freq: Every day | ORAL | Status: DC
  Administered 2014-08-15 – 2014-08-16 (×2): 450 mg via ORAL
  Filled 2014-08-15 (×2): qty 1

## 2014-08-15 MED ORDER — FUROSEMIDE 10 MG/ML IJ SOLN
20.0000 mg | Freq: Two times a day (BID) | INTRAMUSCULAR | Status: DC
  Administered 2014-08-15: 20 mg via INTRAVENOUS
  Filled 2014-08-15 (×2): qty 2

## 2014-08-15 MED ORDER — FUROSEMIDE 10 MG/ML IJ SOLN
20.0000 mg | Freq: Two times a day (BID) | INTRAMUSCULAR | Status: DC
  Administered 2014-08-16 – 2014-08-17 (×3): 20 mg via INTRAVENOUS
  Filled 2014-08-15 (×5): qty 2

## 2014-08-15 NOTE — Plan of Care (Signed)
Problem: Phase III Progression Outcomes Goal: Pain controlled on oral analgesia Outcome: Progressing     

## 2014-08-15 NOTE — Progress Notes (Signed)
Physical Therapy Treatment Patient Details Name: Emily West [facility] MRN: [id number] DOB: 1950/11/19 Today's Date: 08/15/2014    History of Present Illness Pt s/p 2 weeks increasing back pain with pathologic fxs at T10 and L3    PT Comments    SATURATION QUALIFICATIONS: (This note is used to comply with regulatory documentation for home oxygen)  Patient Saturations on Room Air at Rest = 82%  Patient Saturations on Room Air while Ambulating = 77%  Patient Saturations on 3 Liters of oxygen while Ambulating = 88%  Please briefly explain why patient needs home oxygen: to achieve therapeutic levels   O2 sats closely monitored throughout tx.  Pt able to increased gait tolerance to 120 feet in hallway; O2 sats average 87% on 3L.  Family and pt require min cueing for don/doff back brace and back precautions.  O2 sats dropped to 77% on RA during 15 feet of gait.      Follow Up Recommendations  Home health PT;Supervision/Assistance - 24 hour  Family planning to provide 24/7 initial assistance     Equipment Recommendations  Rolling walker with 5" wheels;3in1 (PT);Hospital bed    Recommendations for Other Services OT consult     Precautions / Restrictions Precautions Precautions: Fall Precaution Comments: back Required Braces or Orthoses: Spinal Brace Spinal Brace: Thoracolumbosacral orthotic;Applied in sitting position Restrictions Weight Bearing Restrictions: No    Mobility  Bed Mobility Overal bed mobility: Needs Assistance Bed Mobility: Rolling;Sidelying to Sit Rolling: Min guard;Supervision Sidelying to sit: Min assist       General bed mobility comments: VCs for log rolling technique; requires A for forward scooting  Transfers Overall transfer level: Needs assistance Equipment used: Rolling walker (2 wheeled) Transfers: Sit to/from Stand Sit to Stand: Min assist         General transfer comment: VCs for hand placement  Ambulation/Gait Ambulation/Gait  assistance: Min guard Ambulation Distance (Feet): 120 Feet Assistive device: Rolling walker (2 wheeled) Gait Pattern/deviations: Step-through pattern;Decreased stride length Gait velocity: decreased   General Gait Details: O2 sats averaged 87% on 3L during gait; performed 15 feet of gait on RA and O2 sats dropped to 77%; VCs for breathing technique and posture   Stairs            Wheelchair Mobility    Modified Rankin (Stroke Patients Only)       Balance                                    Cognition Arousal/Alertness: Awake/alert Behavior During Therapy: WFL for tasks assessed/performed Overall Cognitive Status: Within Functional Limits for tasks assessed                      Exercises      General Comments        Pertinent Vitals/Pain Pain Assessment: 0-10 Pain Score: 6  Pain Intervention(s): Monitored during session;Repositioned    Home Living                      Prior Function            PT Goals (current goals can now be found in the care plan section) Progress towards PT goals: Progressing toward goals    Frequency  Min 5X/week    PT Plan Current plan remains appropriate    Co-evaluation  End of Session Equipment Utilized During Treatment: Gait belt;Back brace;Oxygen Activity Tolerance: Patient tolerated treatment well Patient left: in chair;with family/visitor present;with nursing/sitter in room;with call bell/phone within reach     Time: 1127-1155 PT Time Calculation (min) (ACUTE ONLY): 28 min  Charges:                       G Codes:      Miller,Derrick, SPTA 08/15/2014, 1:14 PM   Reviewed above  Rica Koyanagi  PTA [facility]  Acute  Rehab Pager      [phone]

## 2014-08-15 NOTE — Progress Notes (Signed)
PROGRESS NOTE  Emily West [facility] MEQ:[id number] DOB: [birth year] DOA: 08/08/2014 PCP: [name], MD  Summary: 62yow presented with 2 week h/o back pain. MRI revealed T10, L3 fxs as well as significant LAD. CT chest, abdomen, pelvis did not review primary tumor. Back pain was difficult to control blood has improved on combination long and short acting narcotics. 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 She was seen by radiation oncology with plans for radiation 12/2. Because of the possibility of epidural tumor, case was also discussed with neurosurgery with recommendations as below. No surgeries anticipated at this time. In the last 24 hours she has developed acute hypoxic respiratory failure with pulmonary edema of unclear etiology. Plans are for Lasix, 2-D echocardiogram and further monitoring. Once her condition is stabilized, anticipate short-term rehabilitation with outpatient follow-up.  Assessment/Plan: 1. 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 Lung etiology favored by pathology. New diagnosis.Oncology following, MRI brain ordered, pending outpatient PET scan. 2. Back pain secondary to pathologic T10, L3 fxs. No neurologic symptoms. Pain control improving. Physical therapy recommending SNF/CIR. Patient has been on Oxycontin 20 mg po BID along with oxycodone 15 mg po Q 4 hrs prn, and has used 100 mg in total over the past 24 hrs. Will increase the dose of Oxycontin to 40 mg po BID. Will have continous pulse ox at the bedside, and oxycodone 15 mg po Q 4 hr prn for breakthrough pain. 3. Acute hypoxic respiratory failure. Chest x-ray shows pulmonary edema. I/O balanced. No history of IV fluids for the last 4 days. No lower extremity edema. Etiology unclear. 2 d echo done today, follow the results. Will give lasix 20 mg IV q 12  hrs.follow daily BMP. 4. Normocytic anemia. Significant change from admission, no evidence of bleeding. Suspect dilution from admission IV fluids. Plan to check anemia panel. 5. Hyponatremia likely secondary to malignancy. 6. H/o left breast phylloides tumor. 7. COPD- remains stable. Continue Spiriva.  8. Tobacco dependence. Recommend cessation. 9.  Constipation. Relieved. Continue aggressive bowel regimen.     Continue to mobilize with physical therapy, TSLO brace. Up out of bed.  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 Patient remains without neuro symptoms as of 11/30. Per discussion with neurosurgery continue TSLO brace to stabilize T10, can ambulate ad lib when spine braced. May sleep and shower without brace. No indication for surgery.   Radiation oncology simulation conducted today. Plan for radiation therapy 12/2.  Neuro checks q4 hours. Monitor for development of neurologic symptoms.  Code Status: full code DVT prophylaxis: SCDs Family Communication: Discussed in detail with husband, son, daughter-in-law at bedside. Disposition Plan: home when improved  [facility]  Pager [phone] If 7PM-7AM, please contact night-coverage at [email or web address], password [facility] 08/15/2014, 2:30 PM  LOS: 7 days   Consultants:  General surgery   Radiation oncology  Oncology  Procedures: 11/24 Right axilla lymph node biopsy.  Antibiotics:    HPI/Subjective: Patient seen and examined, still complains of back pain 7/10 in intensity.   Objective: Filed Vitals:   08/14/14 1700 08/14/14 2149 08/15/14 0527 08/15/14 1400  BP:  123/52 104/53 111/63  Pulse:  88 86 80  Temp:  98.1 F (36.7 C) 98.8 F (37.1 C) 98.1 F (36.7 C)  TempSrc:  Oral Oral Oral  Resp:  16 18  18  Height:      Weight:      SpO2: 90% 88% 88% 91%    Intake/Output Summary (Last 24 hours) at 08/15/14 1430 Last  data filed at 08/15/14 1400  Gross per 24 hour  Intake    360 ml  Output    650 ml  Net   -290 ml     Filed Weights   08/09/14 0446  Weight: 100.9 kg (222 lb 7.1 oz)    Exam:    Physical Exam: Eyes: No icterus, extraocular muscles intact  Lungs: Normal respiratory effort, bilateral clear to auscultation, no crackles or wheezes.  Heart: Regular rate and rhythm, S1 and S2 normal, no murmurs, rubs auscultated Abdomen: BS normoactive,soft,nondistended,non-tender to palpation,no organomegaly Extremities: No pretibial edema, no erythema, no cyanosis, no clubbing Neuro : Alert and oriented to time, place and person, No focal deficits   Data Reviewed:  Multiple voids. BM 2.  Intake IV charted at 6620. There is not order for IVF nor was there yesterday. RN yesterday not available. Patient denies IVF yesterday.  Sodium slightly lower, 129. Chloride low dehydration, 93. Creatinine unremarkable.  WBC without significant change, 14.6.  Hemoglobin 10.4, change from admission hemoglobin 14.1.  Scheduled Meds: . ARIPiprazole  2 mg Oral Daily  . buPROPion  450 mg Oral Daily  . [START ON 08/17/2014] dexamethasone  8 mg Oral Daily  . FLUoxetine  20 mg Oral TID  . furosemide  20 mg Intravenous Q12H  . nicotine  14 mg Transdermal Daily  . OxyCODONE  40 mg Oral Q12H  . pantoprazole  40 mg Oral Q2000  . polyethylene glycol  17 g Oral BID  . senna  1 tablet Oral QHS  . simvastatin  40 mg Oral QHS  . tiotropium  18 mcg Inhalation Daily   Continuous Infusions:    Principal Problem:   Spinal Metastases Active Problems:   Cancer of intrathoracic lymph nodes, secondary   HTN (hypertension)   History of lump of left breast   Non-traumatic compression fracture of vertebral column   Hypoxia   Acute respiratory failure with hypoxia   Pulmonary edema   Time spent 25 min

## 2014-08-15 NOTE — Progress Notes (Signed)
CSW met with pt / family to assist with d/c planning. PN reviewed. Pt plans to return to home following hospital d/c.  Pt / family wonder what will happen if pt deteriorates once at home and rehab is needed. HH SW can assist with placement from home. Pt also has CSW cell # for further assistance. RNCM will assist with d/c planning. Pt will contact CSW if her plans change and rehab is needed following hospital d/c.  Werner Lean LCSW [phone]

## 2014-08-15 NOTE — Progress Notes (Signed)
2D Echocardiogram has been performed.  Emily West 08/15/2014, 1:30 PM

## 2014-08-16 ENCOUNTER — Inpatient Hospital Stay (HOSPITAL_COMMUNITY): Payer: BC Managed Care – PPO

## 2014-08-16 ENCOUNTER — Ambulatory Visit
Admit: 2014-08-16 | Discharge: 2014-08-16 | Disposition: A | Discharge status: Home / Self Care | Payer: BC Managed Care – PPO | Attending: Radiation Oncology | Admitting: Radiation Oncology

## 2014-08-16 DIAGNOSIS — C7931 Secondary malignant neoplasm of brain: Secondary | ICD-10-CM

## 2014-08-16 LAB — BASIC METABOLIC PANEL
Anion gap: 15 (ref 5–15)
BUN: 23 mg/dL (ref 6–23)
CALCIUM: 9 mg/dL (ref 8.4–10.5)
CO2: 26 mEq/L (ref 19–32)
CREATININE: 1.02 mg/dL (ref 0.50–1.10)
Chloride: 96 mEq/L (ref 96–112)
GFR, EST AFRICAN AMERICAN: 68 mL/min — AB (ref >90)
GFR, EST NON AFRICAN AMERICAN: 59 mL/min — AB (ref >90)
Glucose, Bld: 103 mg/dL — ABNORMAL HIGH (ref 70–99)
Potassium: 3.2 mEq/L — ABNORMAL LOW (ref 3.7–5.3)
Sodium: 137 mEq/L (ref 137–147)

## 2014-08-16 MED ORDER — POTASSIUM CHLORIDE CRYS ER 20 MEQ PO TBCR
40.0000 meq | EXTENDED_RELEASE_TABLET | ORAL | Status: AC
Start: 2014-08-16 — End: 2014-08-16
  Administered 2014-08-16 (×2): 40 meq via ORAL
  Filled 2014-08-16 (×2): qty 2

## 2014-08-16 MED ORDER — GADOBENATE DIMEGLUMINE 529 MG/ML IV SOLN
20.0000 mL | Freq: Once | INTRAVENOUS | Status: AC | PRN
  Administered 2014-08-16: 20 mL via INTRAVENOUS

## 2014-08-16 MED ORDER — FERROUS SULFATE 325 (65 FE) MG PO TABS
325.0000 mg | ORAL_TABLET | Freq: Two times a day (BID) | ORAL | Status: DC
  Administered 2014-08-16 – 2014-08-22 (×11): 325 mg via ORAL
  Filled 2014-08-16 (×14): qty 1

## 2014-08-16 NOTE — Clinical Documentation Improvement (Signed)
Presents with presumed pathological T10, L3 fractures; axillary biopsy performed indicating likely primary lung cancer.   Acute hypoxic respiratory failure documented with CXR revealing pulmonary edema.  IV Lasix 20mg  every 12 hours ordered  Please clarify the acuity of the pulmonary edema and document findings in next progress note and include in discharge summary: Acute, Chronic, Acute on Chronic, Other Condition  Thank You, Zoila Shutter ,RN Clinical Documentation Specialist:  [facility]

## 2014-08-16 NOTE — Plan of Care (Signed)
Problem: Phase II Progression Outcomes Goal: Progress activity as tolerated unless otherwise ordered Outcome: Completed/Met Date Met:  08/16/14 Goal: Discharge plan established Outcome: Completed/Met Date Met:  08/16/14

## 2014-08-16 NOTE — Progress Notes (Signed)
  Radiation Oncology         [phone] ________________________________  Name: Emily West [facility] MRN: [id number]  Date: 08/08/2014  DOB: 22-Sep-1950  Chart Note:  I reviewed this patient's most recent brain MRI.  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  This appears to be amenable to [facility].  ________________________________  [name] [name], M.D.

## 2014-08-16 NOTE — Progress Notes (Signed)
Received patient in the radiation oncology dept for CT/SIM. Noted left arm IV leaking. Patient in need of contrast for SIM. Discontinued left arm IV. Catheter intact upon removal. Applied occlusive dressing to old IV site. Started right wrist 22 gauge IV on the first attempt. Excellent blood return. Flushed without difficulty. Secured IV in place.

## 2014-08-16 NOTE — Progress Notes (Signed)
  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  Identity was confirmed.  All relevant records and images related to the planned course of therapy were reviewed.  The patient freely provided informed written consent to proceed with treatment after reviewing the details related to the planned course of therapy. The consent form was witnessed and verified by the simulation staff. Intravenous access was established for contrast administration. Then, the patient was set-up in a stable reproducible supine position for radiation therapy.  A relocatable thermoplastic stereotactic head frame was fabricated for precise immobilization.  CT images were obtained.  Surface markings were placed.  The CT images were loaded into the planning software and fused with the patient's targeting MRI scan.  Then the target and avoidance structures were contoured.  Treatment planning then occurred.  The radiation prescription was entered and confirmed.  I have requested 3D planning  I have requested a DVH of the following structures: Brain stem, brain, left eye, right eye, lenses, optic chiasm, target volumes, uninvolved brain, and normal tissue.    PLAN:  The patient will receive 20 Gy in one fraction.  ________________________________  [name] [name], M.D.

## 2014-08-16 NOTE — Progress Notes (Addendum)
PROGRESS NOTE  Latrenda Irani [facility] AST:[id number] DOB: 1951-01-30 DOA: 08/08/2014 PCP: [name], MD  Summary: 62yow presented with 2 week h/o back pain. MRI revealed T10, L3 fxs as well as significant LAD. CT chest, abdomen, pelvis did not review primary tumor. Back pain was difficult to control blood has improved on combination long and short acting narcotics. 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 She was seen by radiation oncology with plans for radiation 12/2. Because of the possibility of epidural tumor, case was also discussed with neurosurgery with recommendations as below. No surgeries anticipated at this time. In the last 24 hours she has developed acute hypoxic respiratory failure with pulmonary edema of unclear etiology. Plans are for Lasix, 2-D echocardiogram and further monitoring. Once her condition is stabilized, anticipate short-term rehabilitation with outpatient follow-up.  HPI/Subjective: Currently having some mild left mid back pain. She has no other complaints.  Assessment/Plan: 1. 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 Lung etiology favored by pathology. New diagnosis. Oncology has evaluated the patient-considering chemotherapy- MRI brain reveals small brain met- have notified radiation oncology of this and treatment for this will be stereotactic radiation-continue steroids- pending outpatient PET scan. 2. Back pain secondary to pathologic T10, L3 fxs. No neurologic symptoms. Pain control improving. Physical therapy recommending SNF/CIR.  -  continous pulse ox at the bedside reveals that she is not becoming hypoxic on high dose narcotics-will begin radiotherapy today 3. Acute hypoxic respiratory failure. Suspected to be due to acute pulmonary edema however the patient had not received any IVF for 4 days prior  to this development. No lower extremity edema. 2 d echo does not reveal any underlying heart failure. Repeat chest x-ray the following day reveals resolution of infiltrates-etiology still undetermined-Lasix has been given without significant improvement in her pulse ox -I suspect that she may have chronic respiratory failure secondary to COPD in relation to 30 years of smoking. Will order PFTs. 4. Normocytic anemia. Hemoglobin dropped from 13.9-10.5 over a matter of a week and has remained stable at 10- no evidence of bleeding. Suspected to be dilutional from admission IV fluid- anemia panel feels iron deficiency and therefore I will start oral iron. 5. Hyponatremia suspected to be secondary to malignancy-  6. Hypokalemia-secondary to Lasix-will replace and recheck in the morning 7. H/o left breast phylloides tumor. 8. COPD- remains stable. Continue Spiriva.  9. Tobacco dependence. Recommend cessation. On nicotine patch. 10.  Constipation. Relieved. 11. Depression: Continue fluoxetine-noted to also be on bupropion which decreases seizure threshold -this, along with the fact that she currently has a brain met, places her at a high risk for seizures and therefore I have discontinued bupropion. I have contacted psychiatry to evaluate her in the morning.   Code Status: full code DVT prophylaxis: SCDs Family Communication: Discussed in detail with son at bedside Disposition Plan: home when improved  Debbe Odea, MD [facility]  Pager [email or web address], password [email or web address] 08/16/2014, 3:43 PM  LOS: 8 days   Consultants:  General surgery   Radiation oncology  Oncology  Procedures: 11/24 Right axilla lymph node biopsy.    Objective: Filed Vitals:   08/15/14 1400 08/16/14 0621 08/16/14 0811 08/16/14 1400  BP: 111/63 102/67  140/73  Pulse: 80 81  83  Temp: 98.1 F (36.7 C) 98.5 F (36.9 C)  97.8 F (36.6 C)  TempSrc: Oral Oral  Oral  Resp: 18 18  18  Height:      Weight:      SpO2:  91% 90% 89% 98%    Intake/Output Summary (Last 24 hours) at 08/16/14 1543 Last data filed at 08/16/14 0600  Gross per 24 hour  Intake    360 ml  Output    650 ml  Net   -290 ml     Filed Weights   08/09/14 0446  Weight: 100.9 kg (222 lb 7.1 oz)    Exam:    Physical Exam: Eyes: No icterus, extraocular muscles intact  Lungs: Normal respiratory effort, bilateral clear to auscultation, no crackles or wheezes. Pulse ox 97-100% on 3 L of oxygen. Heart: Regular rate and rhythm, S1 and S2 normal, no murmurs, rubs auscultated Abdomen: BS normoactive,soft,nondistended,non-tender to palpation,no organomegaly Extremities: No pretibial edema, no erythema, no cyanosis, no clubbing Neuro : Alert and oriented to time, place and person, No focal deficits   Data Reviewed:  Lab and imaging studies reviewed.  Scheduled Meds: . ARIPiprazole  2 mg Oral Daily  . [START ON 08/17/2014] dexamethasone  8 mg Oral Daily  . FLUoxetine  20 mg Oral TID  . furosemide  20 mg Intravenous Q12H  . nicotine  14 mg Transdermal Daily  . OxyCODONE  40 mg Oral Q12H  . pantoprazole  40 mg Oral Q2000  . polyethylene glycol  17 g Oral BID  . potassium chloride  40 mEq Oral Q4H  . senna  1 tablet Oral QHS  . simvastatin  40 mg Oral QHS  . tiotropium  18 mcg Inhalation Daily   Continuous Infusions:      Time spent 45 min

## 2014-08-16 NOTE — Consult Note (Signed)
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 female.  Emily West is a 63 y.o. female who is severe back pain worsening for the past 2 weeks. She has a history of low grade chronic back pain for years however in the past 2 weeks her pain has been excruciating and is barely able to move. In the ED patient underwent an MRI of the T/L spine which showed mediastinal lymphadenopathy worrisome for metastatic disease as well as T10 and L3 pathologic fractures.      She then underwent a CT chest/abd/pelvis w/ contrast which showed again extensive lymphadenopathy.     Past Medical History  Diagnosis Date  . History of suicidal ideation   . HTN (hypertension)   . HLD (hyperlipidemia)   . Shortness of breath   . Anxiety and depression   . Menopause   . Benign hematuria   . Impaired fasting glucose   . Other abnormal glucose     Past Surgical History  Procedure Laterality Date  . Colonoscopy  11/21/2003     rare early left-sided diverticula remaining, low sigmoid anastomosis, internal and external hemorrhoids,   . Cystoscopy    . Lymph node biopsy Right 08/09/2014    Procedure: Right axillary lymph node biopsy;  Surgeon: Alphonsa Overall, MD;  Location: [facility]S;  Service: General;  Laterality: Right;    Family History  Problem Relation Age of Onset  . Emphysema Father   . Depression Mother   . Glaucoma Mother   . Thyroid disease Mother   . CVA Mother   . Breast cancer Mother   . Colon cancer Maternal Grandmother   . CVA Brother 23    1/3  . Thyroid disease Sister     1/2, partial thyroidectomy  . Goiter Sister     2/2    Social History:  reports that she has been smoking.  She started smoking about 36 years ago. She does not have any smokeless tobacco history on file. She reports that she drinks alcohol. She reports that she does not use illicit drugs.  Allergies: No Known  Allergies  Medications: I have reviewed the patient's current medications.  Results for orders placed or performed during the hospital encounter of 08/08/14 (from the past 48 hour(s))  Basic metabolic panel     Status: Abnormal   Collection Time: 08/15/14  4:25 AM  Result Value Ref Range   Sodium 133 (L) 137 - 147 mEq/L   Potassium 3.5 (L) 3.7 - 5.3 mEq/L    Comment: DELTA CHECK NOTED REPEATED TO VERIFY    Chloride 92 (L) 96 - 112 mEq/L   CO2 25 19 - 32 mEq/L   Glucose, Bld 102 (H) 70 - 99 mg/dL   BUN 17 6 - 23 mg/dL   Creatinine, Ser 1.07 0.50 - 1.10 mg/dL   Calcium 9.1 8.4 - 10.5 mg/dL   GFR calc non Af Amer 55 (L) >90 mL/min   GFR calc Af Amer 64 (L) >90 mL/min    Comment: (NOTE) The eGFR has been calculated using the CKD EPI equation. This calculation has not been validated in all clinical situations. eGFR's persistently <90 mL/min signify possible Chronic Kidney Disease.    Anion gap 16 (H) 5 - 15  CBC     Status: Abnormal   Collection Time: 08/15/14  4:25 AM  Result Value Ref Range   WBC 14.0 (H) 4.0 -  10.5 K/uL   RBC 3.64 (L) 3.87 - 5.11 MIL/uL   Hemoglobin 10.5 (L) 12.0 - 15.0 g/dL   HCT 32.2 (L) 36.0 - 46.0 %   MCV 88.5 78.0 - 100.0 fL   MCH 28.8 26.0 - 34.0 pg   MCHC 32.6 30.0 - 36.0 g/dL   RDW 13.7 11.5 - 15.5 %   Platelets 252 150 - 400 K/uL  Pro b natriuretic peptide (BNP)     Status: None   Collection Time: 08/15/14  4:25 AM  Result Value Ref Range   Pro B Natriuretic peptide (BNP) 114.8 0 - 125 pg/mL  Vitamin B12     Status: None   Collection Time: 08/15/14  4:25 AM  Result Value Ref Range   Vitamin B-12 690 211 - 911 pg/mL    Comment: Performed at [facility]  Folate     Status: None   Collection Time: 08/15/14  4:25 AM  Result Value Ref Range   Folate 4.0 ng/mL    Comment: (NOTE) Reference Ranges        Deficient:       0.4 - 3.3 ng/mL        Indeterminate:   3.4 - 5.4 ng/mL        Normal:              > 5.4 ng/mL Performed at  [facility]   Iron and TIBC     Status: Abnormal   Collection Time: 08/15/14  4:25 AM  Result Value Ref Range   Iron <10 (L) 42 - 135 ug/dL   TIBC Not calculated due to Iron <10. 250 - 470 ug/dL   Saturation Ratios Not calculated due to Iron <10. 20 - 55 %   UIBC 198 125 - 400 ug/dL    Comment: Performed at [facility]  Ferritin     Status: Abnormal   Collection Time: 08/15/14  4:25 AM  Result Value Ref Range   Ferritin 316 (H) 10 - 291 ng/mL    Comment: Performed at [facility]  Reticulocytes     Status: Abnormal   Collection Time: 08/15/14  4:25 AM  Result Value Ref Range   Retic Ct Pct 1.8 0.4 - 3.1 %   RBC. 3.64 (L) 3.87 - 5.11 MIL/uL   Retic Count, Manual 65.5 19.0 - 186.0 K/uL  Basic metabolic panel     Status: Abnormal   Collection Time: 08/16/14  4:48 AM  Result Value Ref Range   Sodium 137 137 - 147 mEq/L   Potassium 3.2 (L) 3.7 - 5.3 mEq/L   Chloride 96 96 - 112 mEq/L   CO2 26 19 - 32 mEq/L   Glucose, Bld 103 (H) 70 - 99 mg/dL   BUN 23 6 - 23 mg/dL   Creatinine, Ser 1.02 0.50 - 1.10 mg/dL   Calcium 9.0 8.4 - 10.5 mg/dL   GFR calc non Af Amer 59 (L) >90 mL/min   GFR calc Af Amer 68 (L) >90 mL/min    Comment: (NOTE) The eGFR has been calculated using the CKD EPI equation. This calculation has not been validated in all clinical situations. eGFR's persistently <90 mL/min signify possible Chronic Kidney Disease.    Anion gap 15 5 - 15    Dg Chest 2 View  08/15/2014   CLINICAL DATA:  63 year old female with shortness of Breath. Initial encounter. Hypertension, pulmonary edema. Bone metastases and mediastinal lymphadenopathy, history of breast cancer.  EXAM: CHEST  2 VIEW  COMPARISON:  08/14/2014 and earlier.  FINDINGS: Semi upright AP and lateral views of the chest. Stable cardiac size and mediastinal contours. Interval regressed indistinct perihilar opacity. No pneumothorax. No pulmonary edema. There are small bibasilar effusions, or  continued confluent bibasilar atelectasis as was seen on the CT 08/08/2014. Visualized tracheal air column is within normal limits. Stable visualized osseous structures.  IMPRESSION: 1. Regressed bilateral perihilar opacity since yesterday. No new cardiopulmonary abnormality. 2. Bibasilar pleural effusions or confluent atelectasis.   Electronically Signed   By: Lars Pinks M.D.   On: 08/15/2014 14:37   Mr Jeri Cos JK Contrast  08/16/2014   CLINICAL DATA:  Non small cell lung cancer.  EXAM: MRI HEAD WITHOUT AND WITH CONTRAST  TECHNIQUE: Multiplanar, multiecho pulse sequences of the brain and surrounding structures were obtained without and with intravenous contrast.  CONTRAST:  74m MULTIHANCE GADOBENATE DIMEGLUMINE 529 MG/ML IV SOLN  COMPARISON:  None.  FINDINGS: A 5 x 6 mm enhancing mass lesion is present posteriorly in the left temporal or parietal lobe. There is surrounding vasogenic edema. No additional enhancing lesions are present. No other significant white matter disease is evident. No acute infarct or hemorrhage is evident.  Flow is present in the major intracranial arteries. The paranasal sinuses are clear. There is minimal fluid in the inferior mastoid air cells. No obstructing nasopharyngeal lesion is evident. Midline structures are normal. The upper cervical spine and skullbase marrow components are within normal limits.  IMPRESSION: 1. 5 x 6 mm enhancing lesion with surrounding vasogenic edema within the posterior left temporal or inferior parietal lobe is compatible with a focal metastasis. No other enhancing lesions are evident.   Electronically Signed   By: CLawrence SantiagoM.D.   On: 08/16/2014 12:53    Review of Systems - Negative except as above    Blood pressure 140/73, pulse 83, temperature 97.8 F (36.6 C), temperature source Oral, resp. rate 18, height '5\' 5"'  (1.651 m), weight 100.9 kg (222 lb 7.1 oz), SpO2 98 %. Physical Exam  Constitutional: She is oriented to person, place, and time.  She appears well-developed and well-nourished.  HENT:  Head: Normocephalic and atraumatic.  Eyes: Conjunctivae and EOM are normal. Pupils are equal, round, and reactive to light.  Neck: Normal range of motion. Neck supple.  Respiratory: Effort normal.  GI: Soft.  Neurological: She is alert and oriented to person, place, and time. She has normal reflexes.  Skin: Skin is warm and dry.  Psychiatric: She has a normal mood and affect. Her behavior is normal. Judgment and thought content normal.   Back pain limiting activity.  No complaints of weakness or numbness in lower extremities.  Full strength on confrontational testing bilateral upper and lower extremities. Patient is obtaining some pain relief with back brace.  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  Per discussion with Dr. MTammi Klippel we will plan to proceed with SCentrahomatreatment for these lesions on 08/18/2014.  SPeggyann Shoals MD 08/16/2014, 6:38 PM

## 2014-08-17 ENCOUNTER — Encounter: Payer: Self-pay | Admitting: Radiation Oncology

## 2014-08-17 ENCOUNTER — Ambulatory Visit
Admit: 2014-08-17 | Discharge: 2014-08-17 | Disposition: A | Discharge status: Home / Self Care | Payer: BC Managed Care – PPO | Attending: Radiation Oncology | Admitting: Radiation Oncology

## 2014-08-17 ENCOUNTER — Inpatient Hospital Stay (HOSPITAL_COMMUNITY): Payer: BC Managed Care – PPO

## 2014-08-17 DIAGNOSIS — C7931 Secondary malignant neoplasm of brain: Secondary | ICD-10-CM

## 2014-08-17 DIAGNOSIS — F332 Major depressive disorder, recurrent severe without psychotic features: Secondary | ICD-10-CM

## 2014-08-17 DIAGNOSIS — IMO0002 Reserved for concepts with insufficient information to code with codable children: Secondary | ICD-10-CM

## 2014-08-17 DIAGNOSIS — C3490 Malignant neoplasm of unspecified part of unspecified bronchus or lung: Secondary | ICD-10-CM

## 2014-08-17 DIAGNOSIS — C7951 Secondary malignant neoplasm of bone: Secondary | ICD-10-CM

## 2014-08-17 LAB — BASIC METABOLIC PANEL
ANION GAP: 18 — AB (ref 5–15)
BUN: 21 mg/dL (ref 6–23)
CALCIUM: 9 mg/dL (ref 8.4–10.5)
CO2: 25 mEq/L (ref 19–32)
Chloride: 93 mEq/L — ABNORMAL LOW (ref 96–112)
Creatinine, Ser: 0.99 mg/dL (ref 0.50–1.10)
GFR calc Af Amer: 70 mL/min — ABNORMAL LOW (ref >90)
GFR, EST NON AFRICAN AMERICAN: 61 mL/min — AB (ref >90)
Glucose, Bld: 118 mg/dL — ABNORMAL HIGH (ref 70–99)
Potassium: 3.3 mEq/L — ABNORMAL LOW (ref 3.7–5.3)
SODIUM: 136 meq/L — AB (ref 137–147)

## 2014-08-17 LAB — BLOOD GAS, ARTERIAL
Acid-Base Excess: 1.8 mmol/L (ref 0.0–2.0)
Bicarbonate: 25.2 mEq/L — ABNORMAL HIGH (ref 20.0–24.0)
Drawn by: 257701
O2 Content: 3 L/min
O2 Saturation: 88.8 %
PATIENT TEMPERATURE: 98.6
PH ART: 7.45 (ref 7.350–7.450)
TCO2: 22.4 mmol/L (ref 0–100)
pCO2 arterial: 36.7 mmHg (ref 35.0–45.0)
pO2, Arterial: 57.8 mmHg — ABNORMAL LOW (ref 80.0–100.0)

## 2014-08-17 LAB — MAGNESIUM: MAGNESIUM: 2.1 mg/dL (ref 1.5–2.5)

## 2014-08-17 MED ORDER — NALOXONE HCL 0.4 MG/ML IJ SOLN
0.4000 mg | INTRAMUSCULAR | Status: DC | PRN
  Administered 2014-08-20 (×2): 0.2 mg via INTRAVENOUS
  Filled 2014-08-17: qty 1

## 2014-08-17 MED ORDER — HYDROMORPHONE 0.3 MG/ML IV SOLN
INTRAVENOUS | Status: AC
Start: 2014-08-17 — End: 2014-08-18
  Filled 2014-08-17: qty 25

## 2014-08-17 MED ORDER — SODIUM CHLORIDE 0.9 % IJ SOLN: 9.0000 mL | INTRAMUSCULAR | Status: DC | PRN

## 2014-08-17 MED ORDER — ARIPIPRAZOLE 5 MG PO TABS
5.0000 mg | ORAL_TABLET | Freq: Every day | ORAL | Status: DC
  Administered 2014-08-18 – 2014-08-21 (×4): 5 mg via ORAL
  Filled 2014-08-17 (×5): qty 1

## 2014-08-17 MED ORDER — HYDROMORPHONE 0.3 MG/ML IV SOLN
INTRAVENOUS | Status: DC
  Administered 2014-08-17: via INTRAVENOUS
  Administered 2014-08-18: 0.4 mg via INTRAVENOUS
  Administered 2014-08-18: 0 mg via INTRAVENOUS
  Administered 2014-08-18: 0.399 mg via INTRAVENOUS
  Administered 2014-08-19: 0.599 mg via INTRAVENOUS
  Administered 2014-08-19: 6 mg via INTRAVENOUS
  Administered 2014-08-19: 0.999 mg via INTRAVENOUS
  Administered 2014-08-19: 0.59 mg via INTRAVENOUS
  Administered 2014-08-19: 1.39 mg via INTRAVENOUS
  Administered 2014-08-19: 23:00:00 via INTRAVENOUS
  Administered 2014-08-20: 0.999 mg via INTRAVENOUS
  Administered 2014-08-20: 2.55 mg via INTRAVENOUS
  Administered 2014-08-20: 0.6 mg via INTRAVENOUS
  Filled 2014-08-17: qty 25

## 2014-08-17 MED ORDER — OXYCODONE HCL 5 MG PO TABS
20.0000 mg | ORAL_TABLET | ORAL | Status: DC | PRN
Start: 2014-08-17 — End: 2014-08-17
  Administered 2014-08-17 (×2): 20 mg via ORAL
  Filled 2014-08-17 (×5): qty 4

## 2014-08-17 MED ORDER — POTASSIUM CHLORIDE CRYS ER 20 MEQ PO TBCR
40.0000 meq | EXTENDED_RELEASE_TABLET | ORAL | Status: AC
  Administered 2014-08-17 (×2): 40 meq via ORAL
  Filled 2014-08-17 (×2): qty 2

## 2014-08-17 MED ORDER — FLUOXETINE HCL 20 MG PO CAPS
40.0000 mg | ORAL_CAPSULE | Freq: Two times a day (BID) | ORAL | Status: DC
  Administered 2014-08-17 – 2014-08-22 (×10): 40 mg via ORAL
  Filled 2014-08-17 (×12): qty 2

## 2014-08-17 NOTE — Progress Notes (Addendum)
PROGRESS NOTE  Emily West [facility] JDB:[id number] DOB: Sep 05, 1951 DOA: 08/08/2014 PCP: [name], MD  Summary: 62yow presented with 2 week h/o back pain. MRI revealed T10, L3 fxs as well as significant LAD. CT chest, abdomen, pelvis did not review primary tumor. Back pain was difficult to control blood has improved on combination long and short acting narcotics. 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 She was seen by radiation oncology with plans for radiation 12/2. Because of the possibility of epidural tumor, case was also discussed with neurosurgery with recommendations as below. No surgeries anticipated at this time. In the last 24 hours she has developed acute hypoxic respiratory failure with pulmonary edema of unclear etiology. Plans are for Lasix, 2-D echocardiogram and further monitoring. Once her condition is stabilized, anticipate short-term rehabilitation with outpatient follow-up.  HPI/Subjective: She had a sudden exacerbation of left flank/back pain this morning which did not seem to be preciptated by anything. Despite 15 mg  Oxy IR, she remaines uncomfortable. She was able to walk with PT down the hall which did not exacerbate the pain.   Assessment/Plan: 1. 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 Lung etiology favored by pathology. New diagnosis. Oncology has evaluated the patient-considering chemotherapy- MRI brain reveals small brain met- have notified radiation oncology of this and treatment for this will be stereotactic radiation once dose today- f/u with be in 1 month-continue current dose of steroids x 5 days- pending outpatient PET scan. 2. Back pain secondary to pathologic T10, L3 fxs. No neurologic symptoms. Increase Oxy IR to 20 mg- pain expected to be exacerbated by RT -  continous pulse ox at the bedside  reveals that she is not becoming hypoxic on high dose narcotics- 3. Acute and chronic hypoxic respiratory failure. Suspected to be due to acute pulmonary edema however the patient had not received any IVF for 4 days prior to this development. No lower extremity edema. 2 d echo does not reveal any underlying heart failure. Repeat chest x-ray the following day reveals resolution of infiltrates-etiology still undetermined-Lasix has been given without significant improvement in her pulse ox -I suspect that she may have chronic respiratory failure secondary to COPD in relation to 30 years of smoking. Awaiting PFTs- will only be able to do spirometry as she has the back brace. Keep pulse ox at 90%- ABG notes she has CO2 retention.  4. Normocytic anemia. Hemoglobin dropped from 13.9-10.5 over a matter of a week and has remained stable at 10- no evidence of bleeding. Suspected to be dilutional from admission IV fluid- anemia panel reveals iron deficiency and therefore I will start oral iron. 5. Hyponatremia suspected to be secondary to malignancy- appears to have improved for now 6. Hypokalemia-secondary to Lasix-will continue to replace 7. H/o left breast phylloides tumor. 8. COPD- see above 9. Tobacco dependence. Recommend cessation. On nicotine patch. 10. Constipation. Relieved. 11. Depression: Continue fluoxetine-noted to also be on bupropion which decreases seizure threshold -this, along with the fact that she currently has a brain met, places her at a high risk for seizures and therefore I have discontinued bupropion. I have contacted psychiatry to evaluate her.   Code Status: full code DVT prophylaxis: SCDs Family Communication: Discussed in detail with son at bedside Disposition Plan: home when improved  Debbe Odea, MD [facility]  Pager [email or web address], password [facility] 08/17/2014, 7:42 AM  LOS: 9 days  Consultants:  General surgery   Radiation  oncology  Oncology  Procedures: 11/24 Right axilla lymph node biopsy.    Objective: Filed Vitals:   08/16/14 0811 08/16/14 1400 08/16/14 2142 08/17/14 0554  BP:  140/73 133/72 132/60  Pulse:  83 81 81  Temp:  97.8 F (36.6 C) 98.4 F (36.9 C) 98.6 F (37 C)  TempSrc:  Oral Oral Oral  Resp:  _0 Height:      Weight:      SpO2: 89% 98% 91% 91%    Intake/Output Summary (Last 24 hours) at 08/17/14 0742 Last data filed at 08/17/14 0555  Gross per 24 hour  Intake    600 ml  Output   1051 ml  Net   -451 ml     Filed Weights   08/09/14 0446  Weight: 100.9 kg (222 lb 7.1 oz)    Exam:    Physical Exam: Eyes: No icterus, extraocular muscles intact  Lungs: Normal respiratory effort, bilateral clear to auscultation, no crackles or wheezes. Pulse ox 97-100% on 3 L of oxygen. Heart: Regular rate and rhythm, S1 and S2 normal, no murmurs, rubs auscultated Abdomen: BS normoactive,soft,nondistended,non-tender to palpation,no organomegaly Extremities: No pretibial edema, no erythema, no cyanosis, no clubbing Neuro : Alert and oriented to time, place and person, No focal deficits   Data Reviewed:  Lab and imaging studies reviewed.  Scheduled Meds: . ARIPiprazole  2 mg Oral Daily  . dexamethasone  8 mg Oral Daily  . ferrous sulfate  325 mg Oral BID WC  . FLUoxetine  20 mg Oral TID  . furosemide  20 mg Intravenous Q12H  . nicotine  14 mg Transdermal Daily  . OxyCODONE  40 mg Oral Q12H  . pantoprazole  40 mg Oral Q2000  . polyethylene glycol  17 g Oral BID  . senna  1 tablet Oral QHS  . simvastatin  40 mg Oral QHS  . tiotropium  18 mcg Inhalation Daily   Continuous Infusions:      Time spent 45 min

## 2014-08-17 NOTE — Progress Notes (Signed)
Subjective: The patient is seen and examined. Her husband was at the bedside. She is feeling fine except for the back pain and muscle spasm. She denied having any significant chest pain, shortness breath, cough or hemoptysis. She has no nausea or vomiting. MRI of the brain performed yesterday showed small brain lesion.  Objective: Vital signs in last 24 hours: Temp:  [98.4 F (36.9 C)-100 F (37.8 C)] 100 F (37.8 C) (12/03 1400) Pulse Rate:  [80-81] 80 (12/03 1400) Resp:  [18] 18 (12/03 1400) BP: (127-133)/(47-72) 127/47 mmHg (12/03 1400) SpO2:  [91 %-99 %] 99 % (12/03 1400)  Intake/Output from previous day: 12/02 0701 - 12/03 0700 In: 600 [P.O.:600] Out: 1051 [Urine:1050; Stool:1] Intake/Output this shift: Total I/O In: -  Out: 650 [Urine:650]  General appearance: alert, cooperative and no distress Resp: clear to auscultation bilaterally Cardio: regular rate and rhythm, S1, S2 normal, no murmur, click, rub or gallop GI: soft, non-tender; bowel sounds normal; no masses,  no organomegaly Extremities: extremities normal, atraumatic, no cyanosis or edema  Lab Results:   Recent Labs  08/15/14 0425  WBC 14.0*  HGB 10.5*  HCT 32.2*  PLT 252   BMET  Recent Labs  08/16/14 0448 08/17/14 0415  NA 137 136*  K 3.2* 3.3*  CL 96 93*  CO2 26 25  GLUCOSE 103* 118*  BUN 23 21  CREATININE 1.02 0.99  CALCIUM 9.0 9.0    Studies/Results: Mr Jeri Cos Wo Contrast  08/16/2014   CLINICAL DATA:  Non small cell lung cancer.  EXAM: MRI HEAD WITHOUT AND WITH CONTRAST  TECHNIQUE: Multiplanar, multiecho pulse sequences of the brain and surrounding structures were obtained without and with intravenous contrast.  CONTRAST:  69mL MULTIHANCE GADOBENATE DIMEGLUMINE 529 MG/ML IV SOLN  COMPARISON:  None.  FINDINGS: A 5 x 6 mm enhancing mass lesion is present posteriorly in the left temporal or parietal lobe. There is surrounding vasogenic edema. No additional enhancing lesions are present. No  other significant white matter disease is evident. No acute infarct or hemorrhage is evident.  Flow is present in the major intracranial arteries. The paranasal sinuses are clear. There is minimal fluid in the inferior mastoid air cells. No obstructing nasopharyngeal lesion is evident. Midline structures are normal. The upper cervical spine and skullbase marrow components are within normal limits.  IMPRESSION: 1. 5 x 6 mm enhancing lesion with surrounding vasogenic edema within the posterior left temporal or inferior parietal lobe is compatible with a focal metastasis. No other enhancing lesions are evident.   Electronically Signed   By: Lawrence Santiago M.D.   On: 08/16/2014 12:53    Medications: I have reviewed the patient's current medications.  Assessment/Plan: This is a very pleasant 63 years old white female recently diagnosed with metastatic poorly differentiated non-small cell carcinoma with bone and brain metastasis. The recent MRI of the brain showed small brain lesion within the posterior left temporal area with vasogenic edema. She is currently on Decadron and advised the patient to continue with her current treatment. The patient is followed by Dr. [name] and he is considering her for stereotactic radiotherapy to the new brain lesion in addition to the T10 metastatic lesion. I will arrange for the patient a follow-up appointment with me after discharge from the hospital for further evaluation and more detailed discussion of her systemic treatment options. I also ask the pathology department to send her tissue to be tested for molecular biomarkers in addition to PDL 1.    LOS:  9 days    Quintana Canelo K. 08/17/2014

## 2014-08-17 NOTE — Progress Notes (Signed)
Clinical Social Work Department CLINICAL SOCIAL WORK PSYCHIATRY SERVICE LINE ASSESSMENT 08/17/2014  Patient:  Emily West [facility]  Account:  [id number]  Admit Date:  08/08/2014  Clinical Social Worker:  Sindy Messing, LCSW  Date/Time:  08/17/2014 04:30 PM Referred by:  Physician  Date referred:  08/17/2014 Reason for Referral  Psychosocial assessment   Presenting Symptoms/Problems (In the person's/family's own words):   Psych consulted due to depression.   Abuse/Neglect/Trauma History (check all that apply)  Denies history   Abuse/Neglect/Trauma Comments:   Psychiatric History (check all that apply)  Outpatient treatment   Psychiatric medications:  Abilify 2 mg  Prozac 20 mg   Current Mental Health Hospitalizations/Previous Mental Health History:   Patient reports that she has been depressed since childhood. Patient reports that she has seen psychiatrists in the past but her depression stabilized and reports that PCP has been managing her medications.   Current provider:   Dr. Damian Leavell and Date:   [facility]   Current Medications:   Scheduled Meds:      . ARIPiprazole  2 mg Oral Daily  . dexamethasone  8 mg Oral Daily  . ferrous sulfate  325 mg Oral BID WC  . FLUoxetine  20 mg Oral TID  . nicotine  14 mg Transdermal Daily  . OxyCODONE  40 mg Oral Q12H  . pantoprazole  40 mg Oral Q2000  . polyethylene glycol  17 g Oral BID  . senna  1 tablet Oral QHS  . simvastatin  40 mg Oral QHS  . tiotropium  18 mcg Inhalation Daily        Continuous Infusions:      PRN Meds:.albuterol, cyclobenzaprine, HYDROmorphone (DILAUDID) injection, ondansetron, oxyCODONE, zolpidem       Previous Impatient Admission/Date/Reason:   None reported   Emotional Health / Current Symptoms    Suicide/Self Harm  None reported   Suicide attempt in the past:   Patient denies any SI or HI and reports she has too much to live for. Patient denies any previous attempts.   Other harmful  behavior:   None reported   Psychotic/Dissociative Symptoms  Visual Hallucinations   Other Psychotic/Dissociative Symptoms:   Patient reports she saw a cat in her hospital room but knows it was not true.    Attention/Behavioral Symptoms  Within Normal Limits   Other Attention / Behavioral Symptoms:   Patient engaged in assessment.    Cognitive Impairment  Within Normal Limits   Other Cognitive Impairment:   Patient alert and oriented.    Mood and Adjustment  Mood Congruent    Stress, Anxiety, Trauma, Any Recent Loss/Stressor  None reported   Anxiety (frequency):   N/A   Phobia (specify):   N/A   Compulsive behavior (specify):   N/A   Obsessive behavior (specify):   N/A   Other:   N/A   Substance Abuse/Use  None   SBIRT completed (please refer for detailed history):  N  Self-reported substance use:   Patient denies any substance use.   Urinary Drug Screen Completed:  N Alcohol level:   N/A    Environmental/Housing/Living Arrangement  Stable housing   Who is in the home:   Husband   Emergency contact:  [place]   Patient's Strengths and Goals (patient's own words):   Patient reports supportive family.   Clinical Social Worker's Interpretive Summary:   CSW received referral in order to complete psychosocial assessment. CSW reviewed chart and  met with patient and husband at bedside while rounding with psych MD.    Patient reports she has been a smoker for several years and was diagnosed with lung cancer this week. Patient reports a spot was found on her brain and two spots on her spine. Patient reports she has already been scheduled for treatment. Patient lives at home with her husband and has two grown sons. Patient reports that her sisters are also coming from out of town to assist with care.    Patient reports that she has struggled with depression since childhood. Patient has seen psychiatrists in the  past such as Dr. Toy Care and Dr. Caprice Beaver but reports her depression had stabilized so her PCP was prescribing medications lately. Patient reports that she has been increasingly depressed this week but feels it is directly related to cancer diagnosis. Psych MD spoke with patient about medication recommendations.    Patient engaged in assessment with appropriate affect. Patient feels that family is supportive and will assist with her depression but does report some anxiety about DC home and not knowing how her medical treatment will progress. CSW agreeable to continue to follow for support during hospitalization.   Disposition:  Recommend Psych CSW continuing to support while in hospital   [place], [place] [phone]

## 2014-08-17 NOTE — Progress Notes (Signed)
Physical Therapy Treatment Patient Details Name: Marli Diego [facility] MRN: [id number] DOB: 1951-06-27 Today's Date: 08/17/2014    History of Present Illness Pt s/p 2 weeks increasing back pain with pathologic fxs at T10 and L3    PT Comments    Pt improving with breathing technique and maintaining O2 sats.  Pt was given A to bathroom this tx.  Continues to require min A with bed mobility/log rolling technique.    Follow Up Recommendations  Home health PT;Supervision/Assistance - 24 hour     Equipment Recommendations  Rolling walker with 5" wheels;3in1 (PT);Hospital bed    Recommendations for Other Services OT consult     Precautions / Restrictions Precautions Precautions: Fall Precaution Comments: back Required Braces or Orthoses: Spinal Brace Spinal Brace: Thoracolumbosacral orthotic;Applied in sitting position Restrictions Weight Bearing Restrictions: No    Mobility  Bed Mobility Overal bed mobility: Needs Assistance Bed Mobility: Rolling;Sidelying to Sit Rolling: Min guard;Supervision Sidelying to sit: Min assist       General bed mobility comments: requires A for forward scooting  Transfers Overall transfer level: Needs assistance Equipment used: Rolling walker (2 wheeled) Transfers: Sit to/from Stand Sit to Stand: Min guard         General transfer comment: x2; A pt to bathroom from bed  Ambulation/Gait Ambulation/Gait assistance: Min guard Ambulation Distance (Feet): 120 Feet Assistive device: Rolling walker (2 wheeled) Gait Pattern/deviations: Step-through pattern;Decreased stride length Gait velocity: decreased   General Gait Details: O2 sats averaged 88% on 3L; pt improving with breathing and O2 levels; continues to fatigue easily   Stairs            Wheelchair Mobility    Modified Rankin (Stroke Patients Only)       Balance                                    Cognition Arousal/Alertness: Awake/alert Behavior  During Therapy: WFL for tasks assessed/performed Overall Cognitive Status: Within Functional Limits for tasks assessed                      Exercises      General Comments        Pertinent Vitals/Pain Pain Assessment: 0-10 Pain Score: 10-Worst pain ever Pain Location: back Pain Intervention(s): Limited activity within patient's tolerance;Monitored during session;Premedicated before session;Repositioned    Home Living                      Prior Function            PT Goals (current goals can now be found in the care plan section) Progress towards PT goals: Progressing toward goals    Frequency  Min 5X/week    PT Plan Current plan remains appropriate    Co-evaluation             End of Session Equipment Utilized During Treatment: Gait belt;Back brace;Oxygen Activity Tolerance: Patient tolerated treatment well Patient left: in chair;with family/visitor present;with nursing/sitter in room;with call bell/phone within reach     Time: 0943-1011 PT Time Calculation (min) (ACUTE ONLY): 28 min  Charges:                       G Codes:      Miller,Derrick, SPTA 08/17/2014, 12:50 PM   Reviewed above  Rica Koyanagi  PTA [facility]  Acute  Rehab Pager  [phone]    

## 2014-08-17 NOTE — Progress Notes (Signed)
OT Cancellation Note  Patient Details Name: Emily West [facility] MRN: [id number] DOB: Jan 13, 1951   Cancelled Treatment:    Reason Eval/Treat Not Completed: Other (comment).  Pt jjust back from test/procedure. Will check back another time.  [name] 08/17/2014, 2:49 PM  [name], OTR/L [phone] 08/17/2014

## 2014-08-17 NOTE — Consult Note (Signed)
[facility] Face-to-Face Psychiatry Consult   Reason for Consult:  Depression and metastatic cancer Referring Physician:  Dr. Gertha Calkin is an 63 y.o. female. Total Time spent with patient: 45 minutes  Assessment: AXIS I:  Major Depression, Recurrent severe AXIS II:  Deferred AXIS III:   Past Medical History  Diagnosis Date  . History of suicidal ideation   . HTN (hypertension)   . HLD (hyperlipidemia)   . Shortness of breath   . Anxiety and depression   . Menopause   . Benign hematuria   . Impaired fasting glucose   . Other abnormal glucose    AXIS IV:  other psychosocial or environmental problems, problems related to social environment and problems with primary support group AXIS V:  51-60 moderate symptoms  Plan:  Case discussed with the patient patient has been an Sindy Messing, LCSW Will review antidepressant medication from primary care office Discontinue Wellbutrin as the concerned about decreased seizure threshold Increase fluoxetine to 40 mg twice daily and Abilify 5 mg at bedtime No evidence of imminent risk to self or others at present.   Patient does not meet criteria for psychiatric inpatient admission. Supportive therapy provided about ongoing stressors.  Appreciate psychiatric consultation and follow up as clinically required Please contact [phone] or [phone] if needs further assistance  Subjective:   SHIANE WENBERG is a 63 y.o. female patient admitted with depression and back pain.  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 Patient reported she has been depressed most of her life and has been receiving medication management. Patient has no current outpatient psychiatric services but has been receiving antidepressant medication from primary care physician. Patient continued to  endorse symptoms of depression, disturbance of sleep and appetite, low energy and decreased function at home. Patient has denied current suicidal, homicidal ideation, intention or plans. Patient has no evidence of auditory hallucinations, delusions or paranoia but has occasional visual hallucinations like seeing a cat which was not there. Patient lives with her husband who seems to be very supportive to her patient has 2 children. Patient agreed with discontinuation of Wellbutrin secondary to decreased threshold of seizure and we will be adjusting her medication Abilify and fluoxetine for controlling her symptoms of depression anxiety along with possible hallucinations.  Medical history: Patient presented with back pain and her MRI revealed T10, L3 fxs as well as significant LAD. CT chest, abdomen, pelvis did not review primary tumor. Back pain was difficult to control blood has improved on combination long and short acting narcotics. 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 She was seen by radiation oncology with plans for radiation 12/2. Because of the possibility of epidural tumor, case was also discussed with neurosurgery with recommendations as below. No surgeries anticipated at this time. In the last 24 hours she has developed acute hypoxic respiratory failure with pulmonary edema of unclear etiology. Plans are for Lasix, 2-D echocardiogram and further monitoring. Once her condition is stabilized, anticipate short-term rehabilitation with outpatient follow-up.  HPI Elements:  Location:  Depression, anxiety and visual hallucinations. Quality:  Fair to poor. Severity:  Disturbance of sleep and appetite and hopelessness. Timing:  Recent diagnosis of cancer with metastasis.  Past Psychiatric  History: Past Medical History  Diagnosis Date  . History of suicidal ideation   . HTN  (hypertension)   . HLD (hyperlipidemia)   . Shortness of breath   . Anxiety and depression   . Menopause   . Benign hematuria   . Impaired fasting glucose   . Other abnormal glucose     reports that she has been smoking.  She started smoking about 36 years ago. She does not have any smokeless tobacco history on file. She reports that she drinks alcohol. She reports that she does not use illicit drugs. Family History  Problem Relation Age of Onset  . Emphysema Father   . Depression Mother   . Glaucoma Mother   . Thyroid disease Mother   . CVA Mother   . Breast cancer Mother   . Colon cancer Maternal Grandmother   . CVA Brother 79    1/3  . Thyroid disease Sister     1/2, partial thyroidectomy  . Goiter Sister     2/2     Living Arrangements: Spouse/significant other   Abuse/Neglect [facility]) Physical Abuse: Denies Verbal Abuse: Denies Sexual Abuse: Denies Allergies:  No Known Allergies  ACT Assessment Complete:  NO Objective: Blood pressure 132/60, pulse 81, temperature 98.6 F (37 C), temperature source Oral, resp. rate 18, height '5\' 5"'  (1.651 m), weight 100.9 kg (222 lb 7.1 oz), SpO2 94 %.Body mass index is 37.02 kg/(m^2). Results for orders placed or performed during the hospital encounter of 08/08/14 (from the past 72 hour(s))  Basic metabolic panel     Status: Abnormal   Collection Time: 08/14/14 10:45 AM  Result Value Ref Range   Sodium 129 (L) 137 - 147 mEq/L   Potassium 4.7 3.7 - 5.3 mEq/L   Chloride 93 (L) 96 - 112 mEq/L   CO2 22 19 - 32 mEq/L   Glucose, Bld 110 (H) 70 - 99 mg/dL   BUN 12 6 - 23 mg/dL   Creatinine, Ser 0.84 0.50 - 1.10 mg/dL   Calcium 9.1 8.4 - 10.5 mg/dL   GFR calc non Af Amer 74 (L) >90 mL/min   GFR calc Af Amer 86 (L) >90 mL/min    Comment: (NOTE) The eGFR has been calculated using the CKD EPI equation. This calculation has not been validated in all clinical situations. eGFR's persistently <90 mL/min signify possible Chronic  Kidney Disease.    Anion gap 14 5 - 15  CBC     Status: Abnormal   Collection Time: 08/14/14 10:45 AM  Result Value Ref Range   WBC 14.6 (H) 4.0 - 10.5 K/uL   RBC 3.57 (L) 3.87 - 5.11 MIL/uL   Hemoglobin 10.4 (L) 12.0 - 15.0 g/dL   HCT 31.4 (L) 36.0 - 46.0 %   MCV 88.0 78.0 - 100.0 fL   MCH 29.1 26.0 - 34.0 pg   MCHC 33.1 30.0 - 36.0 g/dL   RDW 13.5 11.5 - 15.5 %   Platelets 215 150 - 400 K/uL  Basic metabolic panel     Status: Abnormal   Collection Time: 08/15/14  4:25 AM  Result Value Ref Range   Sodium 133 (L) 137 - 147 mEq/L   Potassium 3.5 (L) 3.7 - 5.3 mEq/L    Comment: DELTA CHECK NOTED REPEATED TO VERIFY    Chloride 92 (L) 96 - 112 mEq/L   CO2 25 19 - 32 mEq/L   Glucose, Bld 102 (H) 70 - 99 mg/dL   BUN 17 6 -  23 mg/dL   Creatinine, Ser 1.07 0.50 - 1.10 mg/dL   Calcium 9.1 8.4 - 10.5 mg/dL   GFR calc non Af Amer 55 (L) >90 mL/min   GFR calc Af Amer 64 (L) >90 mL/min    Comment: (NOTE) The eGFR has been calculated using the CKD EPI equation. This calculation has not been validated in all clinical situations. eGFR's persistently <90 mL/min signify possible Chronic Kidney Disease.    Anion gap 16 (H) 5 - 15  CBC     Status: Abnormal   Collection Time: 08/15/14  4:25 AM  Result Value Ref Range   WBC 14.0 (H) 4.0 - 10.5 K/uL   RBC 3.64 (L) 3.87 - 5.11 MIL/uL   Hemoglobin 10.5 (L) 12.0 - 15.0 g/dL   HCT 32.2 (L) 36.0 - 46.0 %   MCV 88.5 78.0 - 100.0 fL   MCH 28.8 26.0 - 34.0 pg   MCHC 32.6 30.0 - 36.0 g/dL   RDW 13.7 11.5 - 15.5 %   Platelets 252 150 - 400 K/uL  Pro b natriuretic peptide (BNP)     Status: None   Collection Time: 08/15/14  4:25 AM  Result Value Ref Range   Pro B Natriuretic peptide (BNP) 114.8 0 - 125 pg/mL  Vitamin B12     Status: None   Collection Time: 08/15/14  4:25 AM  Result Value Ref Range   Vitamin B-12 690 211 - 911 pg/mL    Comment: Performed at [facility]  Folate     Status: None   Collection Time: 08/15/14  4:25 AM   Result Value Ref Range   Folate 4.0 ng/mL    Comment: (NOTE) Reference Ranges        Deficient:       0.4 - 3.3 ng/mL        Indeterminate:   3.4 - 5.4 ng/mL        Normal:              > 5.4 ng/mL Performed at [facility]   Iron and TIBC     Status: Abnormal   Collection Time: 08/15/14  4:25 AM  Result Value Ref Range   Iron <10 (L) 42 - 135 ug/dL   TIBC Not calculated due to Iron <10. 250 - 470 ug/dL   Saturation Ratios Not calculated due to Iron <10. 20 - 55 %   UIBC 198 125 - 400 ug/dL    Comment: Performed at [facility]  Ferritin     Status: Abnormal   Collection Time: 08/15/14  4:25 AM  Result Value Ref Range   Ferritin 316 (H) 10 - 291 ng/mL    Comment: Performed at [facility]  Reticulocytes     Status: Abnormal   Collection Time: 08/15/14  4:25 AM  Result Value Ref Range   Retic Ct Pct 1.8 0.4 - 3.1 %   RBC. 3.64 (L) 3.87 - 5.11 MIL/uL   Retic Count, Manual 65.5 19.0 - 186.0 K/uL  Basic metabolic panel     Status: Abnormal   Collection Time: 08/16/14  4:48 AM  Result Value Ref Range   Sodium 137 137 - 147 mEq/L   Potassium 3.2 (L) 3.7 - 5.3 mEq/L   Chloride 96 96 - 112 mEq/L   CO2 26 19 - 32 mEq/L   Glucose, Bld 103 (H) 70 - 99 mg/dL   BUN 23 6 - 23 mg/dL   Creatinine, Ser 1.02 0.50 -  1.10 mg/dL   Calcium 9.0 8.4 - 10.5 mg/dL   GFR calc non Af Amer 59 (L) >90 mL/min   GFR calc Af Amer 68 (L) >90 mL/min    Comment: (NOTE) The eGFR has been calculated using the CKD EPI equation. This calculation has not been validated in all clinical situations. eGFR's persistently <90 mL/min signify possible Chronic Kidney Disease.    Anion gap 15 5 - 15  Basic metabolic panel     Status: Abnormal   Collection Time: 08/17/14  4:15 AM  Result Value Ref Range   Sodium 136 (L) 137 - 147 mEq/L   Potassium 3.3 (L) 3.7 - 5.3 mEq/L   Chloride 93 (L) 96 - 112 mEq/L   CO2 25 19 - 32 mEq/L   Glucose, Bld 118 (H) 70 - 99 mg/dL   BUN 21 6 - 23 mg/dL    Creatinine, Ser 0.99 0.50 - 1.10 mg/dL   Calcium 9.0 8.4 - 10.5 mg/dL   GFR calc non Af Amer 61 (L) >90 mL/min   GFR calc Af Amer 70 (L) >90 mL/min    Comment: (NOTE) The eGFR has been calculated using the CKD EPI equation. This calculation has not been validated in all clinical situations. eGFR's persistently <90 mL/min signify possible Chronic Kidney Disease.    Anion gap 18 (H) 5 - 15   Labs are reviewed .  Current Facility-Administered Medications  Medication Dose Route Frequency Provider Last Rate Last Dose  . albuterol (PROVENTIL) (2.5 MG/3ML) 0.083% nebulizer solution 2.5 mg  2.5 mg Nebulization Q2H PRN Samuella Cota, MD      . ARIPiprazole (ABILIFY) tablet 2 mg  2 mg Oral Daily Costin Karlyne Greenspan, MD   2 mg at 08/16/14 1002  . cyclobenzaprine (FLEXERIL) tablet 10 mg  10 mg Oral TID PRN Samuella Cota, MD   10 mg at 08/16/14 1632  . dexamethasone (DECADRON) tablet 8 mg  8 mg Oral Daily Lora Paula, MD      . ferrous sulfate tablet 325 mg  325 mg Oral BID WC Debbe Odea, MD   325 mg at 08/17/14 0909  . FLUoxetine (PROZAC) capsule 20 mg  20 mg Oral TID Caren Griffins, MD   20 mg at 08/16/14 2117  . HYDROmorphone (DILAUDID) injection 0.5-1 mg  0.5-1 mg Intravenous Q4H PRN Samuella Cota, MD   1 mg at 08/17/14 0247  . nicotine (NICODERM CQ - dosed in mg/24 hours) patch 14 mg  14 mg Transdermal Daily Costin Karlyne Greenspan, MD   14 mg at 08/16/14 1004  . ondansetron (ZOFRAN-ODT) disintegrating tablet 4 mg  4 mg Oral Q8H PRN Costin Karlyne Greenspan, MD      . oxyCODONE (Oxy IR/ROXICODONE) immediate release tablet 15 mg  15 mg Oral Q4H PRN Samuella Cota, MD   15 mg at 08/17/14 0909  . OxyCODONE (OXYCONTIN) 12 hr tablet 40 mg  40 mg Oral Q12H Oswald Hillock, MD   40 mg at 08/16/14 2118  . pantoprazole (PROTONIX) EC tablet 40 mg  40 mg Oral Q2000 Samuella Cota, MD   40 mg at 08/16/14 1945  . polyethylene glycol (MIRALAX / GLYCOLAX) packet 17 g  17 g Oral BID Samuella Cota, MD    17 g at 08/16/14 2117  . potassium chloride SA (K-DUR,KLOR-CON) CR tablet 40 mEq  40 mEq Oral Q4H Saima Rizwan, MD      . senna (SENOKOT) tablet 8.6 mg  1  tablet Oral QHS Samuella Cota, MD   8.6 mg at 08/16/14 2117  . simvastatin (ZOCOR) tablet 40 mg  40 mg Oral QHS Caren Griffins, MD   40 mg at 08/16/14 2118  . tiotropium (SPIRIVA) inhalation capsule 18 mcg  18 mcg Inhalation Daily Caren Griffins, MD   18 mcg at 08/17/14 3875  . zolpidem (AMBIEN) tablet 5 mg  5 mg Oral QHS PRN Costin Karlyne Greenspan, MD        Psychiatric Specialty Exam: Physical Exam as per history and physical   ROS depression, anticipated anxiety and fear along with poor sleep and appetite   Blood pressure 132/60, pulse 81, temperature 98.6 F (37 C), temperature source Oral, resp. rate 18, height '5\' 5"'  (1.651 m), weight 100.9 kg (222 lb 7.1 oz), SpO2 94 %.Body mass index is 37.02 kg/(m^2).  General Appearance: Guarded  Eye Contact::  Good  Speech:  Clear and Coherent and Slow  Volume:  Decreased  Mood:  Anxious and Depressed  Affect:  Constricted and Depressed  Thought Process:  Coherent and Goal Directed  Orientation:  Full (Time, Place, and Person)  Thought Content:  Hallucinations: Visual and Rumination  Suicidal Thoughts:  No  Homicidal Thoughts:  No  Memory:  Immediate;   Fair Recent;   Fair  Judgement:  Fair  Insight:  Fair  Psychomotor Activity:  Decreased  Concentration:  Fair  Recall:  Good  Fund of Knowledge:Good  Language: Good  Akathisia:  NA  Handed:  Right  AIMS (if indicated):     Assets:  Communication Skills Desire for Improvement Financial Resources/Insurance Housing Intimacy Leisure Time Resilience Social Support Talents/Skills Transportation  Sleep:      Musculoskeletal: Strength & Muscle Tone: decreased Gait & Station: unable to stand Patient leans: N/A  Treatment Plan Summary: Daily contact with patient to assess and evaluate symptoms and progress in  treatment Medication management  Change fluoxetine 40 mg twice daily for depression and had Abilify 5 mg at bedtime as a booster and also for visual hallucinations Agrees with the recommendation of Wellbutrin and patient is elevated up to change  Roey Coopman,JANARDHAHA R. 08/17/2014 10:15 AM

## 2014-08-17 NOTE — Progress Notes (Signed)
  Radiation Oncology         [phone] ________________________________  Stereotactic Treatment Procedure Note  Name: Emily West MRN: [id number]  Date: 08/08/2014  DOB: Jan 21, 1951  SPECIAL TREATMENT PROCEDURE  3D TREATMENT PLANNING AND DOSIMETRY:  The patient's radiation plan was reviewed and approved by neurosurgery and radiation oncology prior to treatment.  It showed 3-dimensional radiation distributions overlaid onto the planning CT/MRI image set.  The [facility] for the target structures as well as the organs at risk were reviewed. The documentation of the 3D plan and dosimetry are filed in the radiation oncology EMR.  NARRATIVE:  Emily West was brought to the TrueBeam stereotactic radiation treatment machine and placed supine on the CT couch. The head frame was applied, and the patient was set up for stereotactic radiosurgery.  Neurosurgery was present for the set-up and delivery  SIMULATION VERIFICATION:  In the couch zero-angle position, the patient underwent Exactrac imaging using the Brainlab system with orthogonal KV images.  These were carefully aligned and repeated to confirm treatment position for each of the isocenters.  The Exactrac snap film verification was repeated at each couch angle.  SPECIAL TREATMENT PROCEDURE: Emily West received stereotactic radiosurgery to the following targets: Left Parietal 6 mm target was treated using 3 Dynamic Conformal Arcs to a prescription dose of 20 Gy.  ExacTrac registration was performed for each couch angle.  The 82% isodose line was prescribed. 6 MV X-rays were delivered in the flattening filter free beam mode.  STEREOTACTIC TREATMENT MANAGEMENT:  Following delivery, the patient was transported to nursing in stable condition and monitored for possible acute effects.  Vital signs were recorded BP 158/69 mmHg  Pulse 80  Temp(Src) 98 F (36.7 C) (Oral)  Resp 22  Ht 5\' 5"  (1.651 m)  Wt 222 lb 7.1 oz (100.9 kg)  BMI  37.02 kg/m2  SpO2 99%. The patient tolerated treatment without significant acute effects, and was discharged to home in stable condition.    PLAN: Follow-up in one month.  ________________________________  [name]. [name], M.D.

## 2014-08-17 NOTE — Progress Notes (Signed)
Respiratory Therapy Note  Pt unable to tolerate PFT study due to pain.  Taken back via Wheelchair.  Rn informed. Only able to do 2  Incomplete Flow volume loops with error codes present.

## 2014-08-17 NOTE — Progress Notes (Signed)
  Radiation Oncology         [phone] ________________________________  Spinal Stereotactic Radiosurgery Procedure Note  Name: Emily West [facility] MRN: [id number]  Date: 08/08/2014  DOB: [birth year]  SPECIAL TREATMENT PROCEDURE  3D TREATMENT PLANNING AND DOSIMETRY:  The patient's radiation plan was reviewed and approved by neurosurgery and radiation oncology prior to treatment.  It showed 3-dimensional radiation distributions overlaid onto the planning CT/MRI image set.  The [facility] for the target structures as well as the organs at risk were reviewed. The documentation of the 3D plan and dosimetry are filed in the radiation oncology EMR.  NARRATIVE:  Emily [facility] was brought to the TrueBeam stereotactic radiation treatment machine and placed supine on the CT couch. The patient was precisely re-positioned in their BodyFix immobilization device, and the patient was set up for stereotactic radiosurgery.  Neurosurgery was present for the set-up and delivery  SIMULATION VERIFICATION:  In the couch zero-angle position, the patient underwent Exactrac imaging using the Brainlab system with orthogonal KV images to position the target accounting for translation and rotational factors.  These were carefully aligned and repeated to confirm treatment position.  Then, cone beam CT was performed to help further verify placement and make any final translational shifts.  SPECIAL TREATMENT PROCEDURE: Emily West [facility] received stereotactic radiosurgery to the following targets:  The targeted metastasis in the T10 vertebral body was treated using 2 Rapid Arc VMAT Beams to a prescription dose of 18 Gy.  The targeted metastasis in the L3 vertebral body was treated using 2 Rapid Arc VMAT Beams to a prescription dose of 18 Gy.  STEREOTACTIC TREATMENT MANAGEMENT:  Following delivery, the patient was transported to nursing in stable condition and monitored for possible acute effects.  Vital signs were recorded  BP 158/69 mmHg  Pulse 80  Temp(Src) 98 F (36.7 C) (Oral)  Resp 22  Ht 5\' 5"  (1.651 m)  Wt 222 lb 7.1 oz (100.9 kg)  BMI 37.02 kg/m2  SpO2 99%. The patient tolerated treatment without significant acute effects, and was discharged to home in stable condition.    PLAN: Follow-up in one month.  ________________________________  [name]. [name], M.D.

## 2014-08-18 ENCOUNTER — Encounter (HOSPITAL_COMMUNITY): Payer: Self-pay

## 2014-08-18 LAB — BASIC METABOLIC PANEL
ANION GAP: 15 (ref 5–15)
BUN: 19 mg/dL (ref 6–23)
CHLORIDE: 94 meq/L — AB (ref 96–112)
CO2: 26 meq/L (ref 19–32)
CREATININE: 0.87 mg/dL (ref 0.50–1.10)
Calcium: 9.3 mg/dL (ref 8.4–10.5)
GFR calc Af Amer: 82 mL/min — ABNORMAL LOW (ref >90)
GFR calc non Af Amer: 71 mL/min — ABNORMAL LOW (ref >90)
Glucose, Bld: 106 mg/dL — ABNORMAL HIGH (ref 70–99)
Potassium: 4.5 mEq/L (ref 3.7–5.3)
SODIUM: 135 meq/L — AB (ref 137–147)

## 2014-08-18 LAB — OSMOLALITY: OSMOLALITY: 282 mosm/kg (ref 275–300)

## 2014-08-18 MED ORDER — OXYCODONE HCL 5 MG PO TABS
15.0000 mg | ORAL_TABLET | ORAL | Status: DC | PRN
  Administered 2014-08-19 (×6): 20 mg via ORAL
  Administered 2014-08-20: 15 mg via ORAL
  Administered 2014-08-20: 20 mg via ORAL
  Administered 2014-08-21: 10 mg via ORAL
  Administered 2014-08-21: 15 mg via ORAL
  Administered 2014-08-21: 5 mg via ORAL
  Administered 2014-08-21: 20 mg via ORAL
  Filled 2014-08-18 (×2): qty 4
  Filled 2014-08-18: qty 3
  Filled 2014-08-18 (×2): qty 4
  Filled 2014-08-18: qty 3
  Filled 2014-08-18 (×3): qty 4
  Filled 2014-08-18: qty 3

## 2014-08-18 NOTE — Progress Notes (Signed)
Occupational Therapy Treatment Patient Details Name: Emily West [facility] MRN: [id number] DOB: 10/14/1950 Today's Date: 08/18/2014    History of present illness Pt s/p 2 weeks increasing back pain with pathologic fxs at T10 and L3   OT comments  Pt wanted to see tub bench; she states this has been one of her main concerns.  She did not feel up to practicing this today but would like to do so.  Orders state that pt must wear brace when walking but she can shower without this.  Follow Up Recommendations  CIR    Equipment Recommendations  3 in 1 bedside comode;Tub/shower seat;Hospital bed;Tub/shower bench    Recommendations for Other Services      Precautions / Restrictions Precautions Precautions: Fall Precaution Comments: back Required Braces or Orthoses: Spinal Brace Spinal Brace: Thoracolumbosacral orthotic;Applied in sitting position       Mobility Bed Mobility             Transfers                  Balance                                   ADL                                         General ADL Comments: demonstrated tub transfer bench: pt would like to practice this but not today.  Used reacher to wash LEs and simulate pants from bed level      Vision                     Perception     Praxis      Cognition   Behavior During Therapy: [facility] for tasks assessed/performed Overall Cognitive Status: Within Functional Limits for tasks assessed                       Extremity/Trunk Assessment               Exercises    Shoulder Instructions       General Comments      Pertinent Vitals/ Pain       Pain Assessment: 0-10 Pain Score: 6  Pain Location: back Pain Descriptors / Indicators: Aching;Sharp Pain Intervention(s): Limited activity within patient's tolerance; used PCA  Home Living                                          Prior Functioning/Environment               Frequency Min 2X/week     Progress Toward Goals  OT Goals(current goals can now be found in the care plan section)  Progress towards OT goals: Progressing toward goals     Plan      Co-evaluation                 End of Session     Activity Tolerance Patient limited by pain   Patient Left in bed;with call bell/phone within reach   Nurse Communication          Time: 5681-2751 OT Time Calculation (min): 16 min  Charges: OT General Charges $  OT Visit: 1 Procedure OT Treatments $Self Care/Home Management : 8-22 mins  Viren Lebeau 08/18/2014, 1:58 PM  [name], OTR/L [phone] 08/18/2014

## 2014-08-18 NOTE — Progress Notes (Signed)
PROGRESS NOTE  Emily West [facility] WCH:[id number] DOB: [birth year] DOA: 08/08/2014 PCP: [name], MD  Summary: 62yow presented with 2 week h/o back pain. MRI revealed T10, L3 fxs as well as significant LAD. CT chest, abdomen, pelvis did not review primary tumor. Back pain was difficult to control blood has improved on combination long and short acting narcotics. 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 She was seen by radiation oncology with plans for radiation 12/2. Because of the possibility of epidural tumor, case was also discussed with neurosurgery with recommendations as below. No surgeries anticipated at this time. In the last 24 hours she has developed acute hypoxic respiratory failure with pulmonary edema of unclear etiology. Plans are for Lasix, 2-D echocardiogram and further monitoring. Once her condition is stabilized, anticipate short-term rehabilitation with outpatient follow-up.  HPI/Subjective: She had a sudden exacerbation of left flank/back pain after RT yesterday- placed on Dilaudid PCA- currently no pain and no other complaints.   Assessment/Plan: 1. 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 Lung etiology favored by pathology. New diagnosis. Oncology has evaluated the patient-considering chemotherapy- MRI brain reveals small brain met-  stereotactic radiation given 12/3 - f/u with Dr [name] with be in 1 month-continue current dose of steroids x 5 days (last day 12/8) - pending outpatient PET scan. 2. Back pain secondary to pathologic T10, L3 fxs. No neurologic symptoms. Increase Oxy IR to 20 mg yesterday- now on Dilaudid PCA -  Cont oral pain meds - exacerbation of pain expected to last 24-48 hrs after RT- continous pulse ox at the bedside reveals that she is not becoming hypoxic on high dose narcotics- 3. Acute and  chronic hypoxic respiratory failure. Suspected to be due to acute pulmonary edema however the patient had not received any IVF for 4 days prior to this development. No lower extremity edema. 2 d echo does not reveal any underlying heart failure. Repeat chest x-ray the following day reveals resolution of infiltrates-etiology still undetermined-Lasix has been given without significant improvement in her pulse ox -I suspect that she may have chronic respiratory failure secondary to COPD in relation to 30 years of smoking.Keep pulse ox at 90%- ABG notes she has CO2 retention. She was unable to undergo Spirometry due to pain- will need to be performed at a later date. 4. Normocytic anemia. Hemoglobin dropped from 13.9-10.5 over a matter of a week and has remained stable at 10- no evidence of bleeding. Suspected to be dilutional from admission IV fluid- anemia panel reveals iron deficiency and therefore I will start oral iron. 5. Hyponatremia suspected to be secondary to malignancy- check U and serum osm, sodium  6. Hypokalemia-secondary to Lasix-  replaced 7. Hypotension with h/o HTN- Valsartan/ HCTZ on hold. 8. H/o left breast phylloides tumor. 9. COPD- see above 10. Tobacco dependence. Recommend cessation. On nicotine patch. 11. Constipation. Relieved. 12. Depression: psych consult appreciated- Fluoxetine increase to 40 mg BID from 20 TID - Abilify increased to 5 mg from 2 mg    Code Status: full code DVT prophylaxis: SCDs Family Communication: with son Disposition Plan: home when improved  Debbe Odea, MD [facility]  Pager [email or web address], password [facility] 08/18/2014, 8:46 AM  LOS: 10 days   Consultants:  General surgery   Radiation oncology  Oncology  Procedures: 11/24 Right axilla lymph node biopsy.    Objective: Filed Vitals:   08/18/14 0249 08/18/14 3536  08/18/14 0534 08/18/14 0551  BP: 121/64 130/59  111/50  Pulse: 76 74  73  Temp: 97.5 F (36.4 C) 97.9 F (36.6 C)   97.9 F (36.6 C)  TempSrc: Oral Oral  Oral  Resp: _0 Height:      Weight:      SpO2: 97% 95% 96% 95%    Intake/Output Summary (Last 24 hours) at 08/18/14 0846 Last data filed at 08/18/14 7867  Gross per 24 hour  Intake    240 ml  Output   1001 ml  Net   -761 ml     Filed Weights   08/09/14 0446  Weight: 100.9 kg (222 lb 7.1 oz)    Exam:    Physical Exam: Gen: sleepy - oriented x 3, no distress Lungs: Normal respiratory effort, bilateral clear to auscultation, no crackles or wheezes. Pulse ox 97-100% on 3 L of oxygen. Heart: Regular rate and rhythm, S1 and S2 normal, no murmurs, rubs auscultated Abdomen: BS normoactive,soft,nondistended,non-tender to palpation,no organomegaly Extremities: No pretibial edema, no erythema, no cyanosis, no clubbing Neuro : Alert and oriented to time, place and person, No focal deficits   Data Reviewed:  Lab and imaging studies reviewed.  Scheduled Meds: . ARIPiprazole  5 mg Oral QHS  . dexamethasone  8 mg Oral Daily  . ferrous sulfate  325 mg Oral BID WC  . FLUoxetine  40 mg Oral BID  . HYDROmorphone PCA 0.3 mg/mL   Intravenous 6 times per day  . nicotine  14 mg Transdermal Daily  . OxyCODONE  40 mg Oral Q12H  . pantoprazole  40 mg Oral Q2000  . polyethylene glycol  17 g Oral BID  . senna  1 tablet Oral QHS  . simvastatin  40 mg Oral QHS  . tiotropium  18 mcg Inhalation Daily   Continuous Infusions:      Time spent 35 min

## 2014-08-18 NOTE — Progress Notes (Signed)
Physical Therapy Treatment Patient Details Name: Emily West [facility] MRN: [id number] DOB: 1951-01-23 Today's Date: 08/18/2014    History of Present Illness Pt s/p 2 weeks increasing back pain with pathologic fxs at T10 and L3    PT Comments    Pt ambulated in hallway and performed a couple exercises once back in recliner.  Rest breaks taken as needed and pt remained on 3L O2 [place].  Follow Up Recommendations  Home health PT;Supervision/Assistance - 24 hour     Equipment Recommendations  Rolling walker with 5" wheels;3in1 (PT);Hospital bed    Recommendations for Other Services       Precautions / Restrictions Precautions Precautions: Fall Precaution Comments: back Required Braces or Orthoses: Spinal Brace Spinal Brace: Thoracolumbosacral orthotic;Applied in sitting position Restrictions Weight Bearing Restrictions: No    Mobility  Bed Mobility Overal bed mobility: Needs Assistance Bed Mobility: Rolling;Sidelying to Sit Rolling: Supervision Sidelying to sit: Min assist       General bed mobility comments: assist for trunk upright today  Transfers Overall transfer level: Needs assistance Equipment used: Rolling walker (2 wheeled) Transfers: Sit to/from Stand Sit to Stand: Min guard            Ambulation/Gait Ambulation/Gait assistance: Min guard Ambulation Distance (Feet): 180 Feet Assistive device: Rolling walker (2 wheeled) Gait Pattern/deviations: Step-through pattern;Decreased stride length Gait velocity: decreased   General Gait Details: pt able to tolerate increase in distance a little today, a couple short standing rest breaks for breathing as SPO2 low 80s at times however quickly improved, remained on 3L O2   Stairs            Wheelchair Mobility    Modified Rankin (Stroke Patients Only)       Balance                                    Cognition Arousal/Alertness: Awake/alert Behavior During Therapy: WFL for tasks  assessed/performed Overall Cognitive Status: Within Functional Limits for tasks assessed                      Exercises General Exercises - Lower Extremity Ankle Circles/Pumps: AROM;Both;15 reps;Seated Long Arc Quad: AROM;Seated;Both;15 reps Heel Slides: AROM;Both;15 reps;Supine Hip ABduction/ADduction: AROM;Supine;Both;15 reps    General Comments        Pertinent Vitals/Pain Pain Assessment: 0-10 Pain Score: 6  Pain Location: back Pain Descriptors / Indicators: Sharp Pain Intervention(s): Limited activity within patient's tolerance;Monitored during session;PCA encouraged;Repositioned    Home Living                      Prior Function            PT Goals (current goals can now be found in the care plan section) Progress towards PT goals: Progressing toward goals    Frequency  Min 5X/week    PT Plan Current plan remains appropriate    Co-evaluation             End of Session Equipment Utilized During Treatment: Back brace;Oxygen Activity Tolerance: Patient tolerated treatment well Patient left: in chair;with family/visitor present;with call bell/phone within reach     Time: 0973-5329 PT Time Calculation (min) (ACUTE ONLY): 27 min  Charges:  $Gait Training: 8-22 mins $Therapeutic Activity: 8-22 mins                    G Codes:  Zynia Wojtowicz,KATHrine E 08/18/2014, 1:44 PM Carmelia Bake, PT, DPT 08/18/2014 Pager: [phone]

## 2014-08-18 NOTE — Progress Notes (Signed)
Vital signs taken at 1:49 am. Incorrect time documented.

## 2014-08-18 NOTE — Consult Note (Signed)
Psychiatry Consult follow up note  Reason for Consult:  Depression and metastatic cancer Referring Physician:  Dr. Gertha Calkin is an 63 y.o. female. Total Time spent with patient: 45 minutes  Assessment: AXIS I:  Major Depression, Recurrent severe AXIS II:  Deferred AXIS III:   Past Medical History  Diagnosis Date  . History of suicidal ideation   . HTN (hypertension)   . HLD (hyperlipidemia)   . Shortness of breath   . Anxiety and depression   . Menopause   . Benign hematuria   . Impaired fasting glucose   . Other abnormal glucose    AXIS IV:  other psychosocial or environmental problems, problems related to social environment and problems with primary support group AXIS V:  51-60 moderate symptoms  Plan:  Case discussed with the patient patient has been an Emily Messing, LCSW Will review antidepressant medication from primary care office Discontinue Wellbutrin as the concerned about decreased seizure threshold Increase fluoxetine to 40 mg twice daily and Abilify 5 mg at bedtime No evidence of imminent risk to self or others at present.   Patient does not meet criteria for psychiatric inpatient admission. Supportive therapy provided about ongoing stressors.  Appreciate psychiatric consultation and follow up as clinically required Please contact [phone] or [phone] if needs further assistance  Subjective:   Emily West is a 63 y.o. female patient admitted with depression and back pain.  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 Patient reported she has been depressed most of her life and has been receiving medication management. Patient has no current outpatient psychiatric services but has been receiving antidepressant medication from primary care physician. Patient continued  to endorse symptoms of depression, disturbance of sleep and appetite, low energy and decreased function at home. Patient has denied current suicidal, homicidal ideation, intention or plans. Patient has no evidence of auditory hallucinations, delusions or paranoia but has occasional visual hallucinations like seeing a cat which was not there. Patient lives with her husband who seems to be very supportive to her patient has 2 children. Patient agreed with discontinuation of Wellbutrin secondary to decreased threshold of seizure and we will be adjusting her medication Abilify and fluoxetine for controlling her symptoms of depression anxiety along with possible hallucinations.  Interval history: Patient has been compliant with her medication management as adjusted yesterday and has been tolerating without significant side effects. Patient has fasted radiation treatment today which went well without negative incidents. Patient has a multiple family members who has been supportive to her in her room during this visit. We are able to corroborate information from the primary care physician regarding her antidepressant medication which she has been taking, Prozac 20 mg 2 times a day, Wellbutrin XL 450 mg a day and Abilify 2 mg a day.   Past Psychiatric History: Past Medical History  Diagnosis Date  . History of suicidal ideation   . HTN (hypertension)   . HLD (hyperlipidemia)   . Shortness of breath   . Anxiety and depression   . Menopause   . Benign hematuria   . Impaired fasting glucose   . Other abnormal glucose     reports that she has been smoking.  She started smoking about 36 years ago. She does not have any smokeless tobacco history  on file. She reports that she drinks alcohol. She reports that she does not use illicit drugs. Family History  Problem Relation Age of Onset  . Emphysema Father   . Depression Mother   . Glaucoma Mother   . Thyroid disease Mother   . CVA Mother   . Breast cancer  Mother   . Colon cancer Maternal Grandmother   . CVA Brother 67    1/3  . Thyroid disease Sister     1/2, partial thyroidectomy  . Goiter Sister     2/2     Living Arrangements: Spouse/significant other   Abuse/Neglect [facility]) Physical Abuse: Denies Verbal Abuse: Denies Sexual Abuse: Denies Allergies:  No Known Allergies  ACT Assessment Complete:  NO Objective: Blood pressure 111/50, pulse 73, temperature 97.9 F (36.6 C), temperature source Oral, resp. rate 22, height '5\' 5"'  (1.651 m), weight 100.9 kg (222 lb 7.1 oz), SpO2 95 %.Body mass index is 37.02 kg/(m^2). Results for orders placed or performed during the hospital encounter of 08/08/14 (from the past 72 hour(s))  Basic metabolic panel     Status: Abnormal   Collection Time: 08/16/14  4:48 AM  Result Value Ref Range   Sodium 137 137 - 147 mEq/L   Potassium 3.2 (L) 3.7 - 5.3 mEq/L   Chloride 96 96 - 112 mEq/L   CO2 26 19 - 32 mEq/L   Glucose, Bld 103 (H) 70 - 99 mg/dL   BUN 23 6 - 23 mg/dL   Creatinine, Ser 1.02 0.50 - 1.10 mg/dL   Calcium 9.0 8.4 - 10.5 mg/dL   GFR calc non Af Amer 59 (L) >90 mL/min   GFR calc Af Amer 68 (L) >90 mL/min    Comment: (NOTE) The eGFR has been calculated using the CKD EPI equation. This calculation has not been validated in all clinical situations. eGFR's persistently <90 mL/min signify possible Chronic Kidney Disease.    Anion gap 15 5 - 15  Basic metabolic panel     Status: Abnormal   Collection Time: 08/17/14  4:15 AM  Result Value Ref Range   Sodium 136 (L) 137 - 147 mEq/L   Potassium 3.3 (L) 3.7 - 5.3 mEq/L   Chloride 93 (L) 96 - 112 mEq/L   CO2 25 19 - 32 mEq/L   Glucose, Bld 118 (H) 70 - 99 mg/dL   BUN 21 6 - 23 mg/dL   Creatinine, Ser 0.99 0.50 - 1.10 mg/dL   Calcium 9.0 8.4 - 10.5 mg/dL   GFR calc non Af Amer 61 (L) >90 mL/min   GFR calc Af Amer 70 (L) >90 mL/min    Comment: (NOTE) The eGFR has been calculated using the CKD EPI equation. This calculation has not been  validated in all clinical situations. eGFR's persistently <90 mL/min signify possible Chronic Kidney Disease.    Anion gap 18 (H) 5 - 15  Magnesium     Status: None   Collection Time: 08/17/14  4:15 AM  Result Value Ref Range   Magnesium 2.1 1.5 - 2.5 mg/dL  Blood gas, arterial     Status: Abnormal   Collection Time: 08/17/14 12:29 PM  Result Value Ref Range   O2 Content 3.0 L/min   Delivery systems NASAL CANNULA    pH, Arterial 7.450 7.350 - 7.450   pCO2 arterial 36.7 35.0 - 45.0 mmHg   pO2, Arterial 57.8 (L) 80.0 - 100.0 mmHg   Bicarbonate 25.2 (H) 20.0 - 24.0 mEq/L   TCO2 22.4 0 -  100 mmol/L   Acid-Base Excess 1.8 0.0 - 2.0 mmol/L   O2 Saturation 88.8 %   Patient temperature 98.6    Collection site RIGHT RADIAL    Drawn by [id number]    Sample type ARTERIAL DRAW    Allens test (pass/fail) PASS PASS  Basic metabolic panel     Status: Abnormal   Collection Time: 08/18/14  4:22 AM  Result Value Ref Range   Sodium 135 (L) 137 - 147 mEq/L   Potassium 4.5 3.7 - 5.3 mEq/L    Comment: DELTA CHECK NOTED REPEATED TO VERIFY    Chloride 94 (L) 96 - 112 mEq/L   CO2 26 19 - 32 mEq/L   Glucose, Bld 106 (H) 70 - 99 mg/dL   BUN 19 6 - 23 mg/dL   Creatinine, Ser 0.87 0.50 - 1.10 mg/dL   Calcium 9.3 8.4 - 10.5 mg/dL   GFR calc non Af Amer 71 (L) >90 mL/min   GFR calc Af Amer 82 (L) >90 mL/min    Comment: (NOTE) The eGFR has been calculated using the CKD EPI equation. This calculation has not been validated in all clinical situations. eGFR's persistently <90 mL/min signify possible Chronic Kidney Disease.    Anion gap 15 5 - 15   Labs are reviewed .  Current Facility-Administered Medications  Medication Dose Route Frequency Provider Last Rate Last Dose  . albuterol (PROVENTIL) (2.5 MG/3ML) 0.083% nebulizer solution 2.5 mg  2.5 mg Nebulization Q2H PRN Samuella Cota, MD      . ARIPiprazole (ABILIFY) tablet 5 mg  5 mg Oral QHS Durward Parcel, MD      . cyclobenzaprine  (FLEXERIL) tablet 10 mg  10 mg Oral TID PRN Samuella Cota, MD   10 mg at 08/17/14 1945  . dexamethasone (DECADRON) tablet 8 mg  8 mg Oral Daily Lora Paula, MD   8 mg at 08/18/14 0936  . ferrous sulfate tablet 325 mg  325 mg Oral BID WC Debbe Odea, MD   325 mg at 08/18/14 0936  . FLUoxetine (PROZAC) capsule 40 mg  40 mg Oral BID Durward Parcel, MD   40 mg at 08/18/14 6144  . HYDROmorphone (DILAUDID) PCA injection 0.3 mg/mL   Intravenous 6 times per day Gardiner Barefoot, NP      . naloxone [facility]) injection 0.4 mg  0.4 mg Intravenous PRN Gardiner Barefoot, NP       And  . sodium chloride 0.9 % injection 9 mL  9 mL Intravenous PRN Gardiner Barefoot, NP      . nicotine (NICODERM CQ - dosed in mg/24 hours) patch 14 mg  14 mg Transdermal Daily Caren Griffins, MD   14 mg at 08/18/14 3154  . ondansetron (ZOFRAN-ODT) disintegrating tablet 4 mg  4 mg Oral Q8H PRN Caren Griffins, MD   4 mg at 08/17/14 2109  . oxyCODONE (Oxy IR/ROXICODONE) immediate release tablet 15-20 mg  15-20 mg Oral Q4H PRN Debbe Odea, MD      . OxyCODONE (OXYCONTIN) 12 hr tablet 40 mg  40 mg Oral Q12H Oswald Hillock, MD   40 mg at 08/18/14 0939  . pantoprazole (PROTONIX) EC tablet 40 mg  40 mg Oral Q2000 Samuella Cota, MD   40 mg at 08/17/14 1945  . polyethylene glycol (MIRALAX / GLYCOLAX) packet 17 g  17 g Oral BID Samuella Cota, MD   17 g at 08/18/14 [phone]  . senna (SENOKOT)  tablet 8.6 mg  1 tablet Oral QHS Samuella Cota, MD   8.6 mg at 08/17/14 2109  . simvastatin (ZOCOR) tablet 40 mg  40 mg Oral QHS Caren Griffins, MD   40 mg at 08/17/14 2109  . tiotropium (SPIRIVA) inhalation capsule 18 mcg  18 mcg Inhalation Daily Caren Griffins, MD   18 mcg at 08/18/14 0905  . zolpidem (AMBIEN) tablet 5 mg  5 mg Oral QHS PRN Costin Karlyne Greenspan, MD        Psychiatric Specialty Exam: Physical Exam as per history and physical   ROS depression, anticipated anxiety and fear along with poor sleep  and appetite   Blood pressure 111/50, pulse 73, temperature 97.9 F (36.6 C), temperature source Oral, resp. rate 22, height '5\' 5"'  (1.651 m), weight 100.9 kg (222 lb 7.1 oz), SpO2 95 %.Body mass index is 37.02 kg/(m^2).  General Appearance: Guarded  Eye Contact::  Good  Speech:  Clear and Coherent and Slow  Volume:  Decreased  Mood:  Anxious and Depressed  Affect:  Constricted and Depressed  Thought Process:  Coherent and Goal Directed  Orientation:  Full (Time, Place, and Person)  Thought Content:  Hallucinations: Visual and Rumination  Suicidal Thoughts:  No  Homicidal Thoughts:  No  Memory:  Immediate;   Fair Recent;   Fair  Judgement:  Fair  Insight:  Fair  Psychomotor Activity:  Decreased  Concentration:  Fair  Recall:  Good  Fund of Knowledge:Good  Language: Good  Akathisia:  NA  Handed:  Right  AIMS (if indicated):     Assets:  Communication Skills Desire for Improvement Financial Resources/Insurance Housing Intimacy Leisure Time Resilience Social Support Talents/Skills Transportation  Sleep:      Musculoskeletal: Strength & Muscle Tone: decreased Gait & Station: unable to stand Patient leans: N/A  Treatment Plan Summary: Daily contact with patient to assess and evaluate symptoms and progress in treatment Medication management  Change fluoxetine 40 mg twice daily for depression and Abilify 5 mg at bedtime as a booster and also for visual hallucinations Agrees with the recommendation of Wellbutrin and patient is elevated up to change  Devarious Pavek,JANARDHAHA R. 08/18/2014 12:39 PM

## 2014-08-18 NOTE — Progress Notes (Signed)
Clinical Social Work  CSW and psych MD rounded on patient. Patient had several family members in room and reports she is in good spirits. Psych MD spoke with patient about medication adjustments. CSW will continue to follow to provide support throughout hospital stay.  [place], [place] [phone]

## 2014-08-19 LAB — BASIC METABOLIC PANEL
Anion gap: 11 (ref 5–15)
BUN: 17 mg/dL (ref 6–23)
CHLORIDE: 98 meq/L (ref 96–112)
CO2: 27 mEq/L (ref 19–32)
CREATININE: 0.79 mg/dL (ref 0.50–1.10)
Calcium: 8.9 mg/dL (ref 8.4–10.5)
GFR calc non Af Amer: 89 mL/min — ABNORMAL LOW (ref >90)
GLUCOSE: 134 mg/dL — AB (ref 70–99)
Potassium: 4 mEq/L (ref 3.7–5.3)
Sodium: 136 mEq/L — ABNORMAL LOW (ref 137–147)

## 2014-08-19 LAB — CBC
HCT: 28.6 % — ABNORMAL LOW (ref 36.0–46.0)
Hemoglobin: 9.4 g/dL — ABNORMAL LOW (ref 12.0–15.0)
MCH: 29.1 pg (ref 26.0–34.0)
MCHC: 32.9 g/dL (ref 30.0–36.0)
MCV: 88.5 fL (ref 78.0–100.0)
Platelets: 289 10*3/uL (ref 150–400)
RBC: 3.23 MIL/uL — ABNORMAL LOW (ref 3.87–5.11)
RDW: 13.7 % (ref 11.5–15.5)
WBC: 9.4 10*3/uL (ref 4.0–10.5)

## 2014-08-19 MED ORDER — DOCUSATE SODIUM 100 MG PO CAPS
200.0000 mg | ORAL_CAPSULE | Freq: Two times a day (BID) | ORAL | Status: DC
  Administered 2014-08-20 – 2014-08-22 (×4): 200 mg via ORAL
  Filled 2014-08-19 (×8): qty 2

## 2014-08-19 MED ORDER — POLYETHYLENE GLYCOL 3350 17 G PO PACK
17.0000 g | PACK | Freq: Every day | ORAL | Status: DC | PRN
  Filled 2014-08-19: qty 1

## 2014-08-19 MED ORDER — SENNA 8.6 MG PO TABS: 1.0000 | ORAL_TABLET | Freq: Every evening | ORAL | Status: DC | PRN

## 2014-08-19 NOTE — Progress Notes (Signed)
PROGRESS NOTE  Emily West [facility] ZES:[id number] DOB: 1951/09/03 DOA: 08/08/2014 PCP: [name], MD  Summary: 62yow presented with 2 week h/o back pain. MRI revealed T10, L3 fxs as well as significant LAD. CT chest, abdomen, pelvis did not review primary tumor. Back pain was difficult to control blood has improved on combination long and short acting narcotics. 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 She was seen by radiation oncology with plans for radiation 12/2. Because of the possibility of epidural tumor, case was also discussed with neurosurgery with recommendations as below. No surgeries anticipated at this time. In the last 24 hours she has developed acute hypoxic respiratory failure with pulmonary edema of unclear etiology. Plans are for Lasix, 2-D echocardiogram and further monitoring. Once her condition is stabilized, anticipate short-term rehabilitation with outpatient follow-up.  HPI/Subjective: Complains of some lightheadedness when she stands. Pain is under control. She is no longer having cigarette cravings and has been declining her nicotine patch. Was incontinent of stool this morning.  Assessment/Plan: 1. 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 Lung etiology favored by pathology. New diagnosis. Oncology has evaluated the patient-considering chemotherapy- MRI brain reveals small brain met-  stereotactic radiation given 12/3 - f/u with Dr [name] with be in 1 month-continue current dose of steroids x 5 days (last day 12/8) - pending outpatient PET scan. 2. Back pain secondary to pathologic T10, L3 fxs. No neurologic symptoms. Increase Oxy IR to 20 mg yesterday- now on Dilaudid PCA -  Cont oral pain meds while attempting to wean off Dilaudid PCA today- exacerbation of pain expected to last 24-48 hrs after RT- continous  pulse ox at the bedside reveals that she is not becoming hypoxic on high dose narcotics- 3. Acute and chronic hypoxic respiratory failure. Suspected to be due to acute pulmonary edema however the patient had not received any IVF for 4 days prior to this development. No lower extremity edema. 2 d echo does not reveal any underlying heart failure. Repeat chest x-ray the following day reveals resolution of infiltrates-etiology still undetermined-Lasix has been given without significant improvement in her pulse ox -I suspect that she may have chronic respiratory failure secondary to COPD in relation to 30 years of smoking.Keep pulse ox at 90%- ABG notes she has CO2 retention. She was unable to undergo Spirometry due to pain- will need to be performed at a later date. Requiring 2 L of oxygen and maintaining pulse ox of 94%. 4. Normocytic anemia. Hemoglobin dropped from 13.9-10.5 over a matter of a week and has remained stable at 9-10- no evidence of bleeding. Suspected to be dilutional from admission IV fluid- anemia panel reveals iron deficiency and therefore I will start oral iron. 5. Hyponatremia suspected to be secondary to malignancy versus due to low oxygen  U and serum osm ordered yesterday but has not yet been sent, serum sodium 282 6. Incontinence of stool-will change marrow laxative when necessary, DC senna and place on Colace twice a day 7. Lightheaded upon standing-orthostatic vitals negative- suspect this is secondary to narcotics which I have asked her to begin weaning. 8. Hypokalemia-secondary to Lasix-  replaced 9. Hypotension with h/o HTN- Valsartan/ HCTZ on hold. 10. H/o left breast phylloides tumor. 11. COPD- see above 12. Tobacco dependence. Recommend cessation. She feels she no longer needs a nicotine patch therefore I have discontinued it. 13. Constipation. Relieved. 14. Depression: psych consult  appreciated- Fluoxetine increase to 40 mg BID from 20 TID - Abilify increased to 5 mg from 2  mg    Code Status: full code DVT prophylaxis: SCDs Family Communication: son and husband Disposition Plan: home when improved  Debbe Odea, MD [facility]  Pager [email or web address], password [facility] 08/19/2014, 1:33 PM  LOS: 11 days   Consultants:  General surgery   Radiation oncology  Oncology  Procedures: 11/24 Right axilla lymph node biopsy.    Objective: Filed Vitals:   08/19/14 0524 08/19/14 0800 08/19/14 0832 08/19/14 1258  BP: 110/56   117/57  Pulse: 69   72  Temp: 97.5 F (36.4 C)   97.7 F (36.5 C)  TempSrc: Oral   Oral  Resp: '18 18  18  ' Height:      Weight:      SpO2: 92% 94% 94% 95%    Intake/Output Summary (Last 24 hours) at 08/19/14 1333 Last data filed at 08/19/14 1314  Gross per 24 hour  Intake   1200 ml  Output    650 ml  Net    550 ml     Filed Weights   08/09/14 0446  Weight: 100.9 kg (222 lb 7.1 oz)    Exam:    Physical Exam: Gen: Awake alert oriented x 3, eating up in a chair, no distress Lungs: Normal respiratory effort, bilateral clear to auscultation, no crackles or wheezes. Pulse ox 94% on 2 L of oxygen. Heart: Regular rate and rhythm, S1 and S2 normal, no murmurs, rubs auscultated Abdomen: BS normoactive,soft,nondistended,non-tender to palpation,no organomegaly Extremities: No pretibial edema, no erythema, no cyanosis, no clubbing Neuro : Alert and oriented to time, place and person, No focal deficits   Data Reviewed:  Lab and imaging studies reviewed.  Scheduled Meds: . ARIPiprazole  5 mg Oral QHS  . dexamethasone  8 mg Oral Daily  . docusate sodium  200 mg Oral BID  . ferrous sulfate  325 mg Oral BID WC  . FLUoxetine  40 mg Oral BID  . HYDROmorphone PCA 0.3 mg/mL   Intravenous 6 times per day  . OxyCODONE  40 mg Oral Q12H  . pantoprazole  40 mg Oral Q2000  . simvastatin  40 mg Oral QHS  . tiotropium  18 mcg Inhalation Daily   Continuous Infusions:      Time spent 35 min

## 2014-08-19 NOTE — Progress Notes (Signed)
Pt requested Nicotine patch to be removed and states that she didn't think she needed any more in the future

## 2014-08-19 NOTE — Progress Notes (Signed)
Physical Therapy Treatment Patient Details Name: Emily West [facility] MRN: [id number] DOB: [birth year] Today's Date: 10-Sep-2014    History of Present Illness      PT Comments    Pt progressing, incr activity tol today  Follow Up Recommendations  Home health PT;Supervision/Assistance - 24 hour     Equipment Recommendations  Rolling walker with 5" wheels;3in1 (PT);Hospital bed    Recommendations for Other Services       Precautions / Restrictions Precautions Precautions: Fall Precaution Comments: back Required Braces or Orthoses: Spinal Brace Spinal Brace: Thoracolumbosacral orthotic;Applied in sitting position    Mobility  Bed Mobility Overal bed mobility: Needs Assistance Bed Mobility: Rolling;Sidelying to Sit Rolling: Supervision Sidelying to sit: Min guard       General bed mobility comments: cues for technique  Transfers Overall transfer level: Needs assistance Equipment used: Rolling walker (2 wheeled) Transfers: Sit to/from Stand Sit to Stand: Min guard         General transfer comment: cues for hand placement and to emphasize use of LEs, control of descent  Ambulation/Gait Ambulation/Gait assistance: Min guard Ambulation Distance (Feet): 450 Feet Assistive device: Rolling walker (2 wheeled) Gait Pattern/deviations: Step-through pattern;Decreased stride length Gait velocity: decreased   General Gait Details: 4 standing rests due to decr sats; pt sats decr to 87%, recovering with 5 sec rest up to 93%,  96% after  amb, on 2L throughout     Stairs            Wheelchair Mobility    Modified Rankin (Stroke Patients Only)       Balance                                    Cognition Arousal/Alertness: Awake/alert Behavior During Therapy: WFL for tasks assessed/performed Overall Cognitive Status: Within Functional Limits for tasks assessed                      Exercises      General Comments        Pertinent  Vitals/Pain Pain Assessment: 0-10 Pain Score: 3  Pain Location: back  Pain Descriptors / Indicators: Discomfort Pain Intervention(s): Limited activity within patient's tolerance;PCA encouraged;Repositioned    Home Living                      Prior Function            PT Goals (current goals can now be found in the care plan section) Acute Rehab PT Goals Patient Stated Goal: Home with husband PT Goal Formulation: With patient Time For Goal Achievement: 08/24/14 Potential to Achieve Goals: Good Progress towards PT goals: Progressing toward goals    Frequency  Min 5X/week    PT Plan Current plan remains appropriate    Co-evaluation             End of Session Equipment Utilized During Treatment: Back brace;Oxygen Activity Tolerance: Patient tolerated treatment well Patient left: in chair;with family/visitor present;with call bell/phone within reach     Time: 0177-9390 PT Time Calculation (min) (ACUTE ONLY): 26 min  Charges:  $Gait Training: 23-37 mins                    G Codes:      Emily West 09/10/2014, 4:34 PM

## 2014-08-19 NOTE — Plan of Care (Signed)
Problem: Phase III Progression Outcomes Goal: Activity at appropriate level-compared to baseline (UP IN CHAIR FOR HEMODIALYSIS)  Outcome: Progressing Goal: IV/normal saline lock discontinued Outcome: Progressing Goal: Discharge plan remains appropriate-arrangements made Outcome: Progressing

## 2014-08-19 NOTE — Plan of Care (Signed)
Problem: Phase III Progression Outcomes Goal: Activity at appropriate level-compared to baseline (UP IN CHAIR FOR HEMODIALYSIS)  Outcome: Progressing     

## 2014-08-20 LAB — BLOOD GAS, ARTERIAL
ACID-BASE EXCESS: 2.9 mmol/L — AB (ref 0.0–2.0)
Bicarbonate: 25.4 mEq/L — ABNORMAL HIGH (ref 20.0–24.0)
Drawn by: 27022
O2 Content: 5 L/min
O2 SAT: 90.6 %
PATIENT TEMPERATURE: 37
PO2 ART: 58.7 mmHg — AB (ref 80.0–100.0)
TCO2: 22.5 mmol/L (ref 0–100)
pCO2 arterial: 32.9 mmHg — ABNORMAL LOW (ref 35.0–45.0)
pH, Arterial: 7.499 — ABNORMAL HIGH (ref 7.350–7.450)

## 2014-08-20 LAB — OSMOLALITY, URINE: OSMOLALITY UR: 813 mosm/kg (ref 390–1090)

## 2014-08-20 LAB — SODIUM, URINE, RANDOM: Sodium, Ur: <15 mEq/L

## 2014-08-20 MED ORDER — ONDANSETRON HCL 4 MG/2ML IJ SOLN
4.0000 mg | Freq: Four times a day (QID) | INTRAMUSCULAR | Status: DC | PRN
  Administered 2014-08-20 – 2014-08-21 (×2): 4 mg via INTRAVENOUS
  Filled 2014-08-20 (×2): qty 2

## 2014-08-20 MED ORDER — LORAZEPAM 2 MG/ML IJ SOLN
INTRAMUSCULAR | Status: AC
  Administered 2014-08-20: 1 mg
  Filled 2014-08-20: qty 1

## 2014-08-20 MED ORDER — IRBESARTAN 75 MG PO TABS
75.0000 mg | ORAL_TABLET | Freq: Every day | ORAL | Status: DC
  Administered 2014-08-20 – 2014-08-22 (×3): 75 mg via ORAL
  Filled 2014-08-20 (×3): qty 1

## 2014-08-20 MED ORDER — LORAZEPAM 2 MG/ML IJ SOLN
1.0000 mg | Freq: Once | INTRAMUSCULAR | Status: AC
  Administered 2014-08-20: 1 mg via INTRAVENOUS

## 2014-08-20 NOTE — Progress Notes (Signed)
PROGRESS NOTE  Emily West [facility] DJM:[id number] DOB: 06/20/1951 DOA: 08/08/2014 PCP: [name], MD  Summary: 62yow presented with 2 week h/o back pain. MRI revealed T10, L3 fxs as well as significant LAD. CT chest, abdomen, pelvis did not review primary tumor. Back pain was difficult to control blood has improved on combination long and short acting narcotics. 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 She was seen by radiation oncology with plans for radiation 12/2. Because of the possibility of epidural tumor, case was also discussed with neurosurgery with recommendations as below. No surgeries anticipated at this time. In the last 24 hours she has developed acute hypoxic respiratory failure with pulmonary edema of unclear etiology. Plans are for Lasix, 2-D echocardiogram and further monitoring. Once her condition is stabilized, anticipate short-term rehabilitation with outpatient follow-up.  HPI/Subjective: Complains of some lightheadedness when she stands. Pain is under control. She is no longer having cigarette cravings and has been declining her nicotine patch. Was incontinent of stool this morning.  Assessment/Plan: 1. 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 Lung etiology favored by pathology. New diagnosis. Oncology has evaluated the patient-considering chemotherapy- MRI brain reveals small brain met-  stereotactic radiation given 12/3 - f/u with Dr [name] with be in 1 month-continue current dose of steroids x 5 days (last day 12/8) - pending outpatient PET scan. 2. Acute encephalopathy- lethargic and confused this AM- Narcan given by RN which resulted in increased alertness- ABG did not reveal CO2 retention-patient continues to be confused but is improving-this is likely a result of 1 mg of Ativan that was given close to 2 AM  in addition to high-dose narcotics. Apparently the patient did not want to take her Flexeril and was asking for a different muscle relaxant which is why Ativan was given. She is significantly improved now -I have requested that she limit use of the Dilaudid PCA and she has not used it all morning therefore,  I will discontinue it. Continue oral oxycodone when necessary and OxyContin every 12 hours. 3. Back pain secondary to pathologic T10, L3 fxs. No neurologic symptoms. Increase Oxy IR to 20 mg yesterday- was placed on Dilaudid PCA on the evening of 12/4 due to pain exacerbation after RT-see above in regards to narcotics 4. Acute and chronic hypoxic respiratory failure/ COPD? Acute resp failure suspected to be due to acute pulmonary edema however the patient had not received any IVF for 4 days prior to this development. No lower extremity edema. 2 d echo does not reveal any underlying heart failure. Repeat chest x-ray the following day reveals resolution of infiltrate etiology of which is still undetermined-Lasix has been given without significant improvement in her pulse ox -I suspect that she may have chronic respiratory failure secondary to COPD in relation to 30 years of smoking.  ABG notes she has mild CO2 retention. She was unable to undergo Spirometry due to pain- will need to be performed at a later date. Requiring 2 L of oxygen and maintaining pulse ox of 94%. 5. Normocytic anemia. Hemoglobin dropped from 13.9-10.5 over a matter of a week and has remained stable at 9-10- no evidence of bleeding. Suspected to be dilutional from admission IV fluid- anemia panel reveals iron deficiency and therefore I have started oral iron which the patient appears to be tolerating.  6. Hyponatremia suspected to be secondary to malignancy versus due to low oxygen  U and serum osm ordered and have finally been sent-results pending - serum sodium 282 7. Incontinence of stool-changed Miralax to PRN- DC'd senna and placed  on Colace twice a day-continue to follow  8. Hypokalemia-secondary to Lasix-  replaced 9. Hypotension with h/o HTN- Valsartan/ HCTZ have been on hold. Will resume ARB today as BP rising.  10. H/o left breast phylloides tumor. 11. Tobacco dependence. Recommend cessation. She feels she no longer needs a nicotine patch therefore I have discontinued it. 12. Depression: psych consult appreciated- Fluoxetine increase to 40 mg BID from 20 TID - Abilify increased to 5 mg from 2 mg    Code Status: full code DVT prophylaxis: SCDs Family Communication: son and husband Disposition Plan: home when improved  Debbe Odea, MD [facility]  Pager [email or web address], password [facility] 08/20/2014, 12:08 PM  LOS: 12 days   Consultants:  General surgery   Radiation oncology  Oncology  Procedures: 11/24 Right axilla lymph node biopsy.    Objective: Filed Vitals:   08/20/14 0701 08/20/14 0745 08/20/14 0800 08/20/14 0928  BP: 165/78 162/70  153/69  Pulse: 74  90 78  Temp: 98 F (36.7 C)     TempSrc: Oral     Resp: _0 Height:      Weight:      SpO2: 98%  93% 92%    Intake/Output Summary (Last 24 hours) at 08/20/14 1208 Last data filed at 08/20/14 6948  Gross per 24 hour  Intake    720 ml  Output    501 ml  Net    219 ml     Filed Weights   08/09/14 0446  Weight: 100.9 kg (222 lb 7.1 oz)    Exam:    Physical Exam: Gen: alert but confused when evaluated this AM Lungs: Normal respiratory effort, bilateral clear to auscultation, no crackles or wheezes. Pulse ox 94% on 2 L of oxygen. Heart: Regular rate and rhythm, S1 and S2 normal, no murmurs, rubs auscultated Abdomen: BS normoactive,soft,nondistended,non-tender to palpation,no organomegaly Extremities: No pretibial edema, no erythema, no cyanosis, no clubbing Neuro : Alert and oriented to time, place and person, No focal deficits   Data Reviewed:  Lab and imaging studies reviewed.  Scheduled Meds: . ARIPiprazole  5  mg Oral QHS  . dexamethasone  8 mg Oral Daily  . docusate sodium  200 mg Oral BID  . ferrous sulfate  325 mg Oral BID WC  . FLUoxetine  40 mg Oral BID  . HYDROmorphone PCA 0.3 mg/mL   Intravenous 6 times per day  . OxyCODONE  40 mg Oral Q12H  . pantoprazole  40 mg Oral Q2000  . simvastatin  40 mg Oral QHS  . tiotropium  18 mcg Inhalation Daily   Continuous Infusions:      Time spent 35 min

## 2014-08-20 NOTE — Progress Notes (Signed)
Pt restless, confused sat decreased to 86-90. Md notified and abg ordered. Rapid  response called and evaluated.

## 2014-08-20 NOTE — Progress Notes (Signed)
OT Cancellation Note  Patient Details Name: Emily West [facility] MRN: [id number] DOB: April 28, 1951   Cancelled Treatment:    Reason Eval/Treat Not Completed: Medical issues which prohibited therapy  Hopkins, Thereasa Parkin 08/20/2014, 10:08 AM

## 2014-08-20 NOTE — Progress Notes (Signed)
PT LETHARGIC THIS AM, MORE CONFUSED THAN USUAL. COULD BE FROM THE NARCOTIC SHE IS ON. GAVE PT NARCAN, SHE WORK UP, STILL A LITTLE CONFUSED. WILL CONTINUE TO MONITOR.

## 2014-08-21 ENCOUNTER — Encounter (HOSPITAL_COMMUNITY): Payer: Self-pay

## 2014-08-21 DIAGNOSIS — Z7189 Other specified counseling: Secondary | ICD-10-CM

## 2014-08-21 DIAGNOSIS — M5489 Other dorsalgia: Secondary | ICD-10-CM

## 2014-08-21 MED ORDER — OXYCODONE HCL 5 MG PO TABS
10.0000 mg | ORAL_TABLET | ORAL | Status: DC | PRN
Start: 2014-08-21 — End: 2014-08-21

## 2014-08-21 MED ORDER — POLYETHYLENE GLYCOL 3350 17 G PO PACK
17.0000 g | PACK | Freq: Every day | ORAL | Status: DC | PRN
  Filled 2014-08-21: qty 1

## 2014-08-21 MED ORDER — OXYCODONE HCL 5 MG PO TABS
20.0000 mg | ORAL_TABLET | ORAL | Status: DC | PRN
  Administered 2014-08-21: 20 mg via ORAL
  Filled 2014-08-21: qty 4

## 2014-08-21 NOTE — Op Note (Signed)
Lora Paula, MD Physician Cosign Needed Radiation Oncology Progress Notes 08/17/2014 5:40 PM    Expand All Collapse All    Radiation Oncology [phone] ________________________________  Spinal Stereotactic Radiosurgery Procedure Note  Name: Emily West Date: 11/24/2015DOB: 09/20/1950  SPECIAL TREATMENT PROCEDURE  3D TREATMENT PLANNING AND DOSIMETRY: The patient's radiation plan was reviewed and approved by neurosurgery and radiation oncology prior to treatment. It showed 3-dimensional radiation distributions overlaid onto the planning CT/MRI image set. The [facility] for the target structures as well as the organs at risk were reviewed. The documentation of the 3D plan and dosimetry are filed in the radiation oncology EMR.  NARRATIVE: Emily West was brought to the TrueBeam stereotactic radiation treatment machine and placed supine on the CT couch. The patient was precisely re-positioned in their BodyFix immobilization device, and the patient was set up for stereotactic radiosurgery. Radiation Oncology and Neurosurgery were present for the set-up and delivery  SIMULATION VERIFICATION: In the couch zero-angle position, the patient underwent Exactrac imaging using the Brainlab system with orthogonal KV images to position the target accounting for translation and rotational factors. These were carefully aligned and repeated to confirm treatment position. Then, cone beam CT was performed to help further verify placement and make any final translational shifts.  SPECIAL TREATMENT PROCEDURE: Emily West received stereotactic radiosurgery to the following targets:  The targeted metastasis in the T10 vertebral body was treated using 2 Rapid Arc VMAT Beams to a prescription dose of 18 Gy.  The targeted metastasis in the L3 vertebral body was treated using 2 Rapid Arc VMAT Beams to a prescription dose of 18  Gy.  STEREOTACTIC TREATMENT MANAGEMENT: Following delivery, the patient was transported to nursing in stable condition and monitored for possible acute effects. Vital signs were recorded BP 158/69 mmHg  Pulse 80  Temp(Src) 98 F (36.7 C) (Oral)  Resp 22  Ht 5\' 5"  (1.651 m)  Wt 222 lb 7.1 oz (100.9 kg)  BMI 37.02 kg/m2  SpO2 99%. The patient tolerated treatment without significant acute effects, and was discharged to home in stable condition.   PLAN: Follow-up in one month.     Emily Meiers. Vertell Limber, MD

## 2014-08-21 NOTE — Progress Notes (Signed)
Pt c/o being in pain. It was not time form her scheduled 40 mg oxycontin yet, so I administered 15 mg oxycodone and held her 2200 dose of oxycontin.  Will continue to monitor.

## 2014-08-21 NOTE — Op Note (Signed)
Stereotactic Treatment Procedure Note  Name: Ebelin Dillehay [facility] MRN: [id number]  Date: 08/08/2014  DOB: 1951-07-14  SPECIAL TREATMENT PROCEDURE  3D TREATMENT PLANNING AND DOSIMETRY:  The patient's radiation plan was reviewed and approved by neurosurgery and radiation oncology prior to treatment.  It showed 3-dimensional radiation distributions overlaid onto the planning CT/MRI image set.  The [facility] for the target structures as well as the organs at risk were reviewed. The documentation of the 3D plan and dosimetry are filed in the radiation oncology EMR.  NARRATIVE:  Chistine Dematteo [facility] was brought to the TrueBeam stereotactic radiation treatment machine and placed supine on the CT couch. The head frame was applied, and the patient was set up for stereotactic radiosurgery.  Neurosurgery and Radiation Oncology were present for the set-up and delivery  SIMULATION VERIFICATION:  In the couch zero-angle position, the patient underwent Exactrac imaging using the Brainlab system with orthogonal KV images.  These were carefully aligned and repeated to confirm treatment position for each of the isocenters.  The Exactrac snap film verification was repeated at each couch angle.  SPECIAL TREATMENT PROCEDURE: Lutisha Knoche [facility] received stereotactic radiosurgery to the following targets: Left Parietal 6 mm target was treated using 3 Dynamic Conformal Arcs to a prescription dose of 20 Gy.  ExacTrac registration was performed for each couch angle.  The 82% isodose line was prescribed. 6 MV X-rays were delivered in the flattening filter free beam mode.  STEREOTACTIC TREATMENT MANAGEMENT:  Following delivery, the patient was transported to nursing in stable condition and monitored for possible acute effects.  Vital signs were recorded BP 158/69 mmHg  Pulse 80  Temp(Src) 98 F (36.7 C) (Oral)  Resp 22  Ht 5\' 5"  (1.651 m)  Wt 222 lb 7.1 oz (100.9 kg)  BMI 37.02 kg/m2  SpO2 99%. The patient tolerated treatment  without significant acute effects, and was discharged to home in stable condition.    PLAN: Follow-up in one month.  ____________________________  Marchia Meiers. Vertell Limber, MD

## 2014-08-21 NOTE — Progress Notes (Signed)
Physical Therapy Treatment Patient Details Name: Emily West [facility] MRN: [id number] DOB: 1950/10/31 Today's Date: 08/21/2014    History of Present Illness Pt s/p 2 weeks increasing back pain with pathologic fxs at T10 and L3    PT Comments    Pt continues to progress well with gait tolerance; she was able to complete 450 feet in hallway without requiring rest break.  O2 sats avg 87% during gait this tx;  Pt reports pain controlled better while sitting up in recliner this tx.    Follow Up Recommendations  Home health PT;Supervision/Assistance - 24 hour     Equipment Recommendations  Rolling walker with 5" wheels;3in1 (PT);Hospital bed    Recommendations for Other Services OT consult     Precautions / Restrictions Precautions Precautions: Fall Precaution Comments: back Required Braces or Orthoses: Spinal Brace Spinal Brace: Thoracolumbosacral orthotic;Applied in sitting position Restrictions Weight Bearing Restrictions: No    Mobility  Bed Mobility               General bed mobility comments: pt seen OOB in recliner  Transfers Overall transfer level: Needs assistance Equipment used: Rolling walker (2 wheeled) Transfers: Sit to/from Stand Sit to Stand: Min guard         General transfer comment: min-mod effort to perform frrom recliner  Ambulation/Gait Ambulation/Gait assistance: Min guard Ambulation Distance (Feet): 450 Feet Assistive device: Rolling walker (2 wheeled) Gait Pattern/deviations: Step-through pattern Gait velocity: decreased   General Gait Details: O2 sats avg 87% on 2L   Stairs            Wheelchair Mobility    Modified Rankin (Stroke Patients Only)       Balance                                    Cognition Arousal/Alertness: Awake/alert Behavior During Therapy: WFL for tasks assessed/performed Overall Cognitive Status: Within Functional Limits for tasks assessed                      Exercises       General Comments        Pertinent Vitals/Pain Pain Assessment: 0-10 Pain Score: 5  Pain Intervention(s): Monitored during session;Repositioned    Home Living                      Prior Function            PT Goals (current goals can now be found in the care plan section) Progress towards PT goals: Progressing toward goals    Frequency  Min 5X/week    PT Plan Current plan remains appropriate    Co-evaluation             End of Session Equipment Utilized During Treatment: Back brace;Oxygen Activity Tolerance: Patient tolerated treatment well Patient left: in chair;with family/visitor present;with call bell/phone within reach     Time: 1548-1611 PT Time Calculation (min) (ACUTE ONLY): 23 min  Charges:                       G Codes:      Miller,Derrick, SPTA 08/21/2014, 4:20 PM   Reviewed above  Rica Koyanagi  PTA [facility]  Acute  Rehab Pager      [phone]

## 2014-08-21 NOTE — Plan of Care (Signed)
Problem: Phase III Progression Outcomes Goal: Discharge plan remains appropriate-arrangements made Outcome: Completed/Met Date Met:  08/21/14

## 2014-08-21 NOTE — Consult Note (Signed)
Patient Emily West:EGBTDVV K Geiler      DOB: [birth year]      OHY:[id number]     Consult Note from the Palliative Medicine Team at [place] Requested by: Dr Wynelle Cleveland     PCP: [name], MD Reason for Consultation: Clarification of [place] and symptom management      Phone                                                                                                                                                      Number:[phone]  Assessment of patients Current state:    Newly diagnosis lung cancer with metastasis to spine and brain.  Difficult to manage back pain.  Consult is for introduction to the PMT and its role in symptom recommendations as part of a holistic approach to care.  This NP [name] reviewed medical records, received report from team, assessed the patient and then meet at the patient's bedside to discuss  GOCs, symptom management strategies and options.  Values and goals of care important to patient  were attempted to be elicited.  Concept of Palliative Medicine and its role in overall treatment plan   Questions and concerns addressed.  Patient was  encouraged to call with questions or concerns.  PMT will continue to support holistically.  Call was placed to the patient's husband in an attempt to set up f/u meeting    Goals of Care: 1.  Code Status:  Full code   2. Scope of Treatment:  Pateint is open to all available and offered medical interventions   3. Disposition:  Hopes to go home when medically stable to seek OP treatment for her lung cancer   4. Symptom Management:    1. Pain: Presently patient tells me her pain is controlled on current medications                  Oxycodone ER 40 mg every 12 hrs scheduled                   Oxycodone IR 20 mg every 4 hrs prn breakthrough pain                   Flexeril 10 mg  Po tid prn Discussed importance of deep breathing and relaxation exercises, encouraged self repositioning.  Will  re-evaluate in am a make appropriate adjustments in anticipation of dc home.  2. Bowel Regimen:  Colace 200 mg bid 3. Weakness: PT/OT as tolerated and on dc home   5. Psychosocial: Emotional support offered.  Provided space and time for patient to share thoughts, feelings and fears regarding recent cancer diagnosis.       Brief HPI:    Patient presented with back pain and her MRI revealed T10, L3 fxs as well as  significant LAD. CT chest, abdomen, pelvis did not review primary tumor. 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 She was seen by radiation oncology with plans for radiation 12/2.  Difficult  Pain control complicated by acute respiratory failure.  Co-morbid behavioral health diagnosis addressed by psychiatry  ROS: intermittent back pian   PMH:  Past Medical History  Diagnosis Date  . History of suicidal ideation   . HTN (hypertension)   . HLD (hyperlipidemia)   . Shortness of breath   . Anxiety and depression   . Menopause   . Benign hematuria   . Impaired fasting glucose   . Other abnormal glucose      PSH: Past Surgical History  Procedure Laterality Date  . Colonoscopy  11/21/2003     rare early left-sided diverticula remaining, low sigmoid anastomosis, internal and external hemorrhoids,   . Cystoscopy    . Lymph node biopsy Right 08/09/2014    Procedure: Right axillary lymph node biopsy;  Surgeon: Alphonsa Overall, MD;  Location: [facility] ORS;  Service: General;  Laterality: Right;   I have reviewed the [place] and SH and  If appropriate update it with new information. No Known Allergies Scheduled Meds: . ARIPiprazole  5 mg Oral QHS  . docusate sodium  200 mg Oral BID  . ferrous sulfate  325 mg Oral BID WC  . FLUoxetine  40 mg Oral BID  . irbesartan  75 mg Oral Daily  . OxyCODONE  40 mg Oral Q12H  . pantoprazole  40 mg Oral Q2000  . simvastatin  40 mg Oral QHS   . tiotropium  18 mcg Inhalation Daily   Continuous Infusions:  PRN Meds:.albuterol, cyclobenzaprine, ondansetron (ZOFRAN) IV, ondansetron, oxyCODONE, polyethylene glycol, senna, zolpidem    BP 135/75 mmHg  Pulse 81  Temp(Src) 97.6 F (36.4 C) (Oral)  Resp 16  Ht 5\' 5"  (1.651 m)  Wt 100.9 kg (222 lb 7.1 oz)  BMI 37.02 kg/m2  SpO2 91%     Intake/Output Summary (Last 24 hours) at 08/21/14 1403 Last data filed at 08/20/14 2142  Gross per 24 hour  Intake      0 ml  Output      0 ml  Net      0 ml   Physical Exam:  General: alert and oriented, visibly anxious  HEENT:  Moist buccal membranes, no exudate Chest:  CTA CVS: RRR Abdomen:soft NT +BS Ext: without edema Neuro: oriented X3 moves all four extremities  Labs: CBC    Component Value Date/Time   WBC 9.4 08/19/2014 0503   RBC 3.23* 08/19/2014 0503   RBC 3.64* 08/15/2014 0425   HGB 9.4* 08/19/2014 0503   HCT 28.6* 08/19/2014 0503   PLT 289 08/19/2014 0503   MCV 88.5 08/19/2014 0503   MCH 29.1 08/19/2014 0503   MCHC 32.9 08/19/2014 0503   RDW 13.7 08/19/2014 0503   LYMPHSABS 2.7 08/08/2014 0951   MONOABS 1.1* 08/08/2014 0951   EOSABS 0.1 08/08/2014 0951   BASOSABS 0.0 08/08/2014 0951    BMET    Component Value Date/Time   NA 136* 08/19/2014 0503   K 4.0 08/19/2014 0503   CL 98 08/19/2014 0503   CO2 27 08/19/2014 0503   GLUCOSE 134* 08/19/2014 0503   BUN 17 08/19/2014 0503   CREATININE 0.79 08/19/2014 0503   CALCIUM 8.9 08/19/2014 0503   GFRNONAA 89* 08/19/2014 0503  GFRAA >90 08/19/2014 0503    CMP     Component Value Date/Time   NA 136* 08/19/2014 0503   K 4.0 08/19/2014 0503   CL 98 08/19/2014 0503   CO2 27 08/19/2014 0503   GLUCOSE 134* 08/19/2014 0503   BUN 17 08/19/2014 0503   CREATININE 0.79 08/19/2014 0503   CALCIUM 8.9 08/19/2014 0503   PROT 6.9 03/06/2007 0140   ALBUMIN 3.8 03/06/2007 0140   AST 15 03/06/2007 0140   ALT 12 03/06/2007 0140   ALKPHOS 81 03/06/2007 0140    BILITOT 0.7 03/06/2007 0140   GFRNONAA 89* 08/19/2014 0503   GFRAA >90 08/19/2014 0503     Time In Time Out Total Time Spent with Patient Total Overall Time  1400 1530 80 min 90 min    Greater than 50%  of this time was spent counseling and coordinating care related to the above assessment and plan.   [name] NP  Palliative Medicine Team Team Phone # [phone] Pager [phone]

## 2014-08-21 NOTE — Progress Notes (Signed)
PROGRESS NOTE  Shanette Tamargo [facility] ION:[id number] DOB: [birth year] DOA: 08/08/2014 PCP: [name], MD  Summary: 62yow presented with 2 week h/o back pain. MRI revealed T10, L3 fxs as well as significant LAD. CT chest, abdomen, pelvis did not review primary tumor. Back pain was difficult to control blood has improved on combination long and short acting narcotics. 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 She was seen by radiation oncology with plans for radiation 12/2. Because of the possibility of epidural tumor, case was also discussed with neurosurgery with recommendations as below. No surgeries anticipated at this time. In the last 24 hours she has developed acute hypoxic respiratory failure with pulmonary edema of unclear etiology. Plans are for Lasix, 2-D echocardiogram and further monitoring. Once her condition is stabilized, anticipate short-term rehabilitation with outpatient follow-up.  HPI/Subjective: Having significant pain again today. Unable to get out of bed.   Assessment/Plan: 1. 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 Lung etiology favored by pathology. New diagnosis. Oncology has evaluated the patient-considering chemotherapy- MRI brain reveals small brain met-  stereotactic radiation given 12/3 - f/u with Dr [name] with be in 1 month-continue current dose of steroids x 5 days (last day 12/8) - pending outpatient PET scan. 2. Acute encephalopathy- lethargic and confused this AM- Narcan given by RN which resulted in increased alertness- ABG did not reveal CO2 retention-patient continues to be confused but is improving-this is likely a result of 1 mg of Ativan that was given close to 2 AM in addition to high-dose narcotics. Apparently the patient did not want to take her Flexeril and was asking for a different muscle  relaxant which is why Ativan was given. She  has significantly improved now -Dilaudid PCA discontinued. Continue oral oxycodone when necessary and OxyContin every 12 hours. Night RN did not give her Oxycontin as she was sleepy. Given Oxycontin early this AM- Patient intermittantly sleeping but c/o pain - Will ask palliative medicine to assist with pain management.  3. Back pain secondary to pathologic T10, L3 fxs. No neurologic symptoms. Increase Oxy IR to 20 mg yesterday- was placed on Dilaudid PCA on the evening of 12/4 due to pain exacerbation after RT-see above in regards to narcotics 4. Acute and chronic hypoxic respiratory failure/ COPD? Acute resp failure suspected to be due to acute pulmonary edema however the patient had not received any IVF for 4 days prior to this development. No lower extremity edema. 2 d echo does not reveal any underlying heart failure. Repeat chest x-ray the following day reveals resolution of infiltrate etiology of which is still undetermined-Lasix has been given without significant improvement in her pulse ox -I suspect that she may have chronic respiratory failure secondary to COPD in relation to 30 years of smoking.  ABG notes she has mild CO2 retention. She was unable to undergo Spirometry due to pain- will need to be performed at a later date. Requiring 2 L of oxygen and maintaining pulse ox of 94%. 5. Normocytic anemia. Hemoglobin dropped from 13.9-10.5 over a matter of a week and has remained stable at 9-10- no evidence of bleeding. Suspected to be dilutional from admission IV fluid- anemia panel reveals iron deficiency and therefore I have started oral iron which the patient appears to be tolerating.  6. Hyponatremia suspected to be secondary to malignancy versus due to low oxygen  U and serum osm ordered and  have finally been sent-results pending - serum sodium 282 7. Incontinence of stool-changed Miralax to PRN- DC'd senna and placed on Colace twice a day-continue to  follow  8. Hypokalemia-secondary to Lasix-  replaced 9. Hypotension with h/o HTN- Valsartan/ HCTZ have been on hold. Will resume ARB today as BP rising.  10. H/o left breast phylloides tumor. 11. Tobacco dependence. Recommend cessation. She feels she no longer needs a nicotine patch therefore I have discontinued it. 12. Depression: psych consult appreciated- Fluoxetine increase to 40 mg BID from 20 TID - Abilify increased to 5 mg from 2 mg    Code Status: full code DVT prophylaxis: SCDs Family Communication: son and husband Disposition Plan: home when improved  Debbe Odea, MD [facility]  Pager [email or web address], password [facility] 08/21/2014, 10:55 AM  LOS: 13 days   Consultants:  General surgery   Radiation oncology  Oncology  Procedures: 11/24 Right axilla lymph node biopsy.    Objective: Filed Vitals:   08/20/14 1400 08/20/14 2141 08/21/14 0545 08/21/14 0936  BP: 158/69 136/58 135/75   Pulse: 80 78 81   Temp:  98.4 F (36.9 C) 97.6 F (36.4 C)   TempSrc:  Oral Oral   Resp: _0 Height:      Weight:      SpO2: 99% 94% 95% 91%    Intake/Output Summary (Last 24 hours) at 08/21/14 1055 Last data filed at 08/20/14 2142  Gross per 24 hour  Intake      0 ml  Output      0 ml  Net      0 ml     Filed Weights   08/09/14 0446  Weight: 100.9 kg (222 lb 7.1 oz)    Exam:    Physical Exam: Gen: alert but confused when evaluated this AM Lungs: Normal respiratory effort, bilateral clear to auscultation, no crackles or wheezes. Pulse ox 94% on 2 L of oxygen. Heart: Regular rate and rhythm, S1 and S2 normal, no murmurs, rubs auscultated Abdomen: BS normoactive,soft,nondistended,non-tender to palpation,no organomegaly Extremities: No pretibial edema, no erythema, no cyanosis, no clubbing Neuro : Alert and oriented to time, place and person, No focal deficits   Data Reviewed:  Lab and imaging studies reviewed.  Scheduled Meds: . ARIPiprazole  5 mg Oral  QHS  . dexamethasone  8 mg Oral Daily  . docusate sodium  200 mg Oral BID  . ferrous sulfate  325 mg Oral BID WC  . FLUoxetine  40 mg Oral BID  . irbesartan  75 mg Oral Daily  . OxyCODONE  40 mg Oral Q12H  . pantoprazole  40 mg Oral Q2000  . simvastatin  40 mg Oral QHS  . tiotropium  18 mcg Inhalation Daily   Continuous Infusions:      Time spent 35 min

## 2014-08-22 ENCOUNTER — Telehealth: Payer: Self-pay | Admitting: Internal Medicine

## 2014-08-22 ENCOUNTER — Telehealth: Payer: Self-pay | Admitting: Medical Oncology

## 2014-08-22 DIAGNOSIS — C3490 Malignant neoplasm of unspecified part of unspecified bronchus or lung: Secondary | ICD-10-CM

## 2014-08-22 DIAGNOSIS — R590 Localized enlarged lymph nodes: Secondary | ICD-10-CM | POA: Insufficient documentation

## 2014-08-22 DIAGNOSIS — J9611 Chronic respiratory failure with hypoxia: Secondary | ICD-10-CM

## 2014-08-22 DIAGNOSIS — M549 Dorsalgia, unspecified: Secondary | ICD-10-CM

## 2014-08-22 DIAGNOSIS — Z720 Tobacco use: Secondary | ICD-10-CM

## 2014-08-22 DIAGNOSIS — E871 Hypo-osmolality and hyponatremia: Secondary | ICD-10-CM

## 2014-08-22 DIAGNOSIS — C771 Secondary and unspecified malignant neoplasm of intrathoracic lymph nodes: Secondary | ICD-10-CM

## 2014-08-22 DIAGNOSIS — Z7189 Other specified counseling: Secondary | ICD-10-CM

## 2014-08-22 MED ORDER — OXYCODONE HCL 5 MG PO TABS
10.0000 mg | ORAL_TABLET | ORAL | Status: DC | PRN
  Administered 2014-08-22: 10 mg via ORAL
  Filled 2014-08-22: qty 2

## 2014-08-22 MED ORDER — DSS 100 MG PO CAPS: 200.0000 mg | ORAL_CAPSULE | Freq: Two times a day (BID) | ORAL | Status: DC

## 2014-08-22 MED ORDER — OXYCODONE HCL ER 30 MG PO T12A: 30.0000 mg | EXTENDED_RELEASE_TABLET | Freq: Three times a day (TID) | ORAL | Status: DC

## 2014-08-22 MED ORDER — FERROUS SULFATE 325 (65 FE) MG PO TABS: 325.0000 mg | ORAL_TABLET | Freq: Two times a day (BID) | ORAL | Status: AC

## 2014-08-22 MED ORDER — OXYCODONE HCL 10 MG PO TABS: 10.0000 mg | ORAL_TABLET | ORAL | Status: DC | PRN

## 2014-08-22 MED ORDER — FLUOXETINE HCL 40 MG PO CAPS: 40.0000 mg | ORAL_CAPSULE | Freq: Two times a day (BID) | ORAL | Status: DC

## 2014-08-22 MED ORDER — ONDANSETRON 4 MG PO TBDP: 4.0000 mg | ORAL_TABLET | Freq: Three times a day (TID) | ORAL | Status: DC | PRN

## 2014-08-22 MED ORDER — SENNA 8.6 MG PO TABS: 1.0000 | ORAL_TABLET | Freq: Every evening | ORAL | Status: DC | PRN

## 2014-08-22 MED ORDER — ZOLPIDEM TARTRATE 10 MG PO TABS: 5.0000 mg | ORAL_TABLET | Freq: Every evening | ORAL | Status: AC | PRN

## 2014-08-22 MED ORDER — CYCLOBENZAPRINE HCL 10 MG PO TABS: 10.0000 mg | ORAL_TABLET | Freq: Every day | ORAL | Status: AC

## 2014-08-22 MED ORDER — OXYCODONE HCL ER 15 MG PO T12A
30.0000 mg | EXTENDED_RELEASE_TABLET | Freq: Three times a day (TID) | ORAL | Status: DC
  Administered 2014-08-22 (×2): 30 mg via ORAL
  Filled 2014-08-22 (×2): qty 2

## 2014-08-22 MED ORDER — POLYETHYLENE GLYCOL 3350 17 G PO PACK: 17.0000 g | PACK | Freq: Every day | ORAL | Status: DC | PRN

## 2014-08-22 MED ORDER — IRBESARTAN 75 MG PO TABS: 75.0000 mg | ORAL_TABLET | Freq: Every day | ORAL | Status: DC

## 2014-08-22 NOTE — Progress Notes (Signed)
OT Cancellation Note  Patient Details Name: [facility] MRN: [id number] DOB: 08/12/1951   Cancelled Treatment:    Reason Eval/Treat Not Completed: Other (comment).  Pt plans home today.  She just finished working with PT and is very tired.  She asked what 3:1 commode did, and I explained this to her.  She also asked about LB  Bathing.  I told her she can use reacher or get a long sponge.    Maddoxx Burkitt 08/22/2014, 3:45 PM  [name], OTR/L [phone] 08/22/2014

## 2014-08-22 NOTE — Progress Notes (Signed)
Progress Note from the Palliative Medicine Team at [facility]:   -patient is alert and oriented, husband at bedside  -discussed pain management changes and patient understands and is hopeful for acceptable pain management on discharge home.  Detailed ER and IR argents and their role in pain management strategy,  discussed side effects and safety and storage of medication.  -conversation with husband and patient raising awareness to importance of advanced directive discussion and  documentation of   Objective: No Known Allergies Scheduled Meds: . ARIPiprazole  5 mg Oral QHS  . docusate sodium  200 mg Oral BID  . ferrous sulfate  325 mg Oral BID WC  . FLUoxetine  40 mg Oral BID  . irbesartan  75 mg Oral Daily  . OxyCODONE  30 mg Oral 3 times per day  . pantoprazole  40 mg Oral Q2000  . simvastatin  40 mg Oral QHS  . tiotropium  18 mcg Inhalation Daily   Continuous Infusions:  PRN Meds:.albuterol, cyclobenzaprine, ondansetron (ZOFRAN) IV, ondansetron, oxyCODONE, polyethylene glycol, senna, zolpidem  BP 153/73 mmHg  Pulse 74  Temp(Src) 97.9 F (36.6 C) (Oral)  Resp 16  Ht 5\' 5"  (1.651 m)  Wt 100.9 kg (222 lb 7.1 oz)  BMI 37.02 kg/m2  SpO2 95%   Pain Score: 4/10 "much better"    Intake/Output Summary (Last 24 hours) at 08/22/14 1112 Last data filed at 08/22/14 1000  Gross per 24 hour  Intake    270 ml  Output   1450 ml  Net  -1180 ml       Physical Exam:  General: NAD HEENT:  Moist buccal membranes Chest:   CTA CVS: RRR Abdomen: soft NT Neuro: alert and oriented X3   Labs: CBC    Component Value Date/Time   WBC 9.4 08/19/2014 0503   RBC 3.23* 08/19/2014 0503   RBC 3.64* 08/15/2014 0425   HGB 9.4* 08/19/2014 0503   HCT 28.6* 08/19/2014 0503   PLT 289 08/19/2014 0503   MCV 88.5 08/19/2014 0503   MCH 29.1 08/19/2014 0503   MCHC 32.9 08/19/2014 0503   RDW 13.7 08/19/2014 0503   LYMPHSABS 2.7 08/08/2014 0951   MONOABS 1.1* 08/08/2014 0951   EOSABS 0.1 08/08/2014 0951   BASOSABS 0.0 08/08/2014 0951    BMET    Component Value Date/Time   NA 136* 08/19/2014 0503   K 4.0 08/19/2014 0503   CL 98 08/19/2014 0503   CO2 27 08/19/2014 0503   GLUCOSE 134* 08/19/2014 0503   BUN 17 08/19/2014 0503   CREATININE 0.79 08/19/2014 0503   CALCIUM 8.9 08/19/2014 0503   GFRNONAA 89* 08/19/2014 0503   GFRAA >90 08/19/2014 0503    CMP     Component Value Date/Time   NA 136* 08/19/2014 0503   K 4.0 08/19/2014 0503   CL 98 08/19/2014 0503   CO2 27 08/19/2014 0503   GLUCOSE 134* 08/19/2014 0503   BUN 17 08/19/2014 0503   CREATININE 0.79 08/19/2014 0503   CALCIUM 8.9 08/19/2014 0503   PROT 6.9 03/06/2007 0140   ALBUMIN 3.8 03/06/2007 0140   AST 15 03/06/2007 0140   ALT 12 03/06/2007 0140   ALKPHOS 81 03/06/2007 0140   BILITOT 0.7 03/06/2007 0140   GFRNONAA 89* 08/19/2014 0503   GFRAA >90 08/19/2014 0503    Assessment and Plan: 1. Code Status: Full code 2. Symptom Control:  Pain: OxyContin 30 mg po every 8 hrs scheduled  Oxycodone IR 10 mg every 4 hrs po prn  3. Psycho/Social:  Emotional support offered to patient  and her husband, this is a very difficult time trying to process the gravity of facing this serious illness and family and life style adjustments     4. Disposition:  Home with home health when medically stable    Time In Time Out Total Time Spent with Patient Total Overall Time  0800 0840 40 min 40 min    Greater than 50%  of this time was spent counseling and coordinating care related to the above assessment and plan.  [name] NP  Palliative Medicine Team Team Phone # [phone] Pager [phone]  Discussed with Dr Tana Coast 1

## 2014-08-22 NOTE — Progress Notes (Signed)
Physical Therapy Treatment Patient Details Name: Emily West [facility] MRN: [id number] DOB: 05-11-1951 Today's Date: 08/22/2014    History of Present Illness Pt s/p 2 weeks increasing back pain with pathologic fxs at T10 and L3    PT Comments    Received page by RN that pt was D/C to home this evening and that pt was anxious about climbing stairs.  Pt stated she had 11 steps to enter her home.   Returned a second time and practiced stair training. Assisted pt with ascending 10 steps.  Pt had a near fall on last step due to fatigue and leading with wrong leg.  Therapist recovered and pt assisted to chair that was already placed at top of stairs for safety.  After a good 5 min rest period, assisted pt back down same 10 steps which pt tolerated better however this activity exhausted her.  Rolled back to her room in her recliner, assisted to [facility] then back to bed to rest before D/C.  Spoke with spouse on phone regarding session.  Instructed  him on level of assistance needed, lead with R LE going up stairs and to have a chair at top of stairs.    Follow Up Recommendations  Home health PT;Supervision/Assistance - 24 hour     Equipment Recommendations  Rolling walker with 5" wheels;3in1 (PT);Hospital bed    Recommendations for Other Services       Precautions / Restrictions Precautions Precautions: Fall Precaution Comments: back Required Braces or Orthoses: Spinal Brace Spinal Brace: Thoracolumbosacral orthotic;Applied in sitting position Restrictions Weight Bearing Restrictions: No    Mobility  Bed Mobility Overal bed mobility: Needs Assistance Bed Mobility: Sit to Supine;Sit to Sidelying Rolling: Min guard       Sit to sidelying: Min assist General bed mobility comments: 25% VC's on proper tech to "log roll" and avoid twisting.  Assisted pt back to bed.  Transfers Overall transfer level: Needs assistance Equipment used: Rolling walker (2 wheeled) Transfers: Sit to/from  Stand Sit to Stand: Min guard         General transfer comment: one VC on proper hand placement with stand to sit  Ambulation/Gait Ambulation/Gait assistance: Supervision Ambulation Distance (Feet): 5 Feet         General Gait Details: Tx session focused on stairs.  Amb on 3 lts.   Stairs Stairs: Yes Stairs assistance: Min assist Stair Management: One rail Right Number of Stairs: 10 General stair comments: 50% VC's on proper tech to asend with R LE step to and deep breathing as pt demon increased anxiety/fear.  On last step pt went up with L LE causing near fall/therapist recovered.  Pt c/o max fatigue at this point.  Straight back chair was situated at top of the steps for safety and assisted to chair to allow pt to rest prior to decending stairs.  after a good 5 min sitting break, assisted pt down same 10 steps using L LE first (weaker( step to tolerated well with 25% VC's on proper tech.    Wheelchair Mobility    Modified Rankin (Stroke Patients Only)       Balance                                    Cognition  Exercises      General Comments        Pertinent Vitals/Pain Pain Assessment: 0-10 Pain Score: 2  Pain Location: back Pain Descriptors / Indicators: Discomfort Pain Intervention(s): Monitored during session    Home Living                      Prior Function            PT Goals (current goals can now be found in the care plan section) Progress towards PT goals: Progressing toward goals    Frequency  Min 5X/week    PT Plan      Co-evaluation             End of Session Equipment Utilized During Treatment: Back brace;Oxygen;Gait belt Activity Tolerance: Patient limited by fatigue Patient left: in bed;with call bell/phone within reach     Time: 1455-1535 PT Time Calculation (min) (ACUTE ONLY): 39 min  Charges:  $Gait Training: 23-37 mins $Therapeutic Activity: 8-22  mins                    G Codes:      Rica Koyanagi  PTA [facility]  Acute  Rehab Pager      [phone]

## 2014-08-22 NOTE — Progress Notes (Signed)
Assessment unchanged.  Scripts were given per MD order.  All questions pertaining to D/C we answered.  Pt was D/C'd via wheelchair and accompanied by NT/RN.  Roselind Rily

## 2014-08-22 NOTE — Plan of Care (Signed)
Problem: Phase I Progression Outcomes Goal: Other Phase I Outcomes/Goals Outcome: Completed/Met Date Met:  08/22/14     

## 2014-08-22 NOTE — Progress Notes (Signed)
  CARE MANAGEMENT ED NOTE 08/22/2014  Patient:  West,[name]   Account Number:  [id number]  Date Initiated:  08/22/2014  Documentation initiated by:  Livia Snellen  Subjective/Objective Assessment:   Patient admitted to [facility] for two week history of back pain     Subjective/Objective Assessment Detail:   Patient with non small cell lung cancer     Action/Plan:   Action/Plan Detail:   Anticipated DC Date:  08/22/2014     Status Recommendation to Physician:   Result of Recommendation:    Other ED Services  Consult [facility]  CM consult  Other    Choice offered to / List presented to:    DME arranged  [facility]     DME agency  [facility].        Status of service:  Completed, signed off  ED Comments:   ED Comments Detail:  [facility] consulted to assist with oxygen at home.  As per [facility], oxygen tanks in patient's room "left open"  and patient in need of oxygen for transport home.  AC reports floor RN has called [facility].  EDCM calling [facility] when received phone call from [facility] that [facility] services no longer needed as they were able to get oxygen for patient.  No further EDCM needs at this time.

## 2014-08-22 NOTE — Discharge Summary (Signed)
Physician Discharge Summary  [facility] UYQ:[id number] DOB: 05-30-1951 DOA: 08/08/2014  PCP: [name], MD  Admit date: 08/08/2014 Discharge date: 08/22/2014  Time spent: 45 minutes  Recommendations for Outpatient Follow-up:  1. 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 MRI revealed T10, L3 fxs as well as significant LAD. CT chest, abdomen, pelvis did not review primary tumor. Back pain was difficult to control blood has improved on combination long and short acting narcotics. 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 She was seen by radiation oncology with plans for radiation 12/2. Because of the possibility of epidural tumor, case was also discussed with neurosurgery with recommendations as below.   Hospital Course:   Poorly differentiatednon-small cell carcinoma metastatic to spine and brain with mediastinal lymphadenopathy. Lung etiology favored by pathology. New diagnosis. Oncology has evaluated the patient-considering chemotherapy- MRI brain reveals small brain met- stereotactic radiation given 12/3 - f/u with Dr [name] with be in 1 month - pending outpatient PET scan.  Acute encephalopathy- lethargic and confused this 12/6- Narcan given by RN which resulted in increased alertness- ABG did not reveal CO2 retention- confusion  likely was a result of 1 mg of Ativan that was given close to 2 AM in addition to high-dose narcotics. According to the story from the RN,  the patient did not want to take her Flexeril and was asking for a different muscle relaxant which is why Ativan was given.   Back pain secondary to pathologic T10, L3 fxs. No focal deficits. Pain is mainly in right flank. Palliative care team asked to assist with management and current doses of Narcotics recommended on which the patient is stable. Continue back brace.   Acute and chronic hypoxic respiratory failure/ COPD? Acute resp failure suspected to be due to acute pulmonary edema however the patient had not received any IVF for 4 days prior to this development. No lower extremity edema. 2 d echo does not reveal any underlying heart failure. Repeat chest x-ray the following day reveals resolution of infiltrate etiology of which is still undetermined-Lasix has been given without significant improvement in her pulse ox -I suspect that she may have chronic respiratory failure secondary to COPD in relation to 30 years of smoking. ABG notes she has mild CO2 retention. She was unable to undergo Spirometry due to pain - will need to be performed at a later date. Full PFTs cannot be done due to back brace. Requiring 2 L of oxygen at rest and maintaining pulse ox of 94%.  Normocytic anemia. Hemoglobin dropped from 13.9-10.5 over a matter of a week and has remained stable at 9-10- no evidence of bleeding- anemia panel reveals iron deficiency and therefore I have started oral iron which the patient appears to be tolerating.   Hyponatremia -initially thought to be SIADH but after urine/serum studies obtained, it is likely  due to dehydration (Lasix?)- have asked patient to increase fluid intake over the next few days- note that she is not orthostatic-   Incontinence of stool-changed Miralax to PRN- DC'd senna and placed on Colace twice a day-continue to follow    Hypokalemia-secondary to Lasix- replaced  Hypotension with h/o HTN- Resume Valsartan but continue to hold HCTZ due to dehydration  H/o left breast phylloides tumor.  Tobacco dependence. Recommend cessation. She feels she no longer needs a nicotine patch therefore I have discontinued it.  Depression: psych consult appreciated- Fluoxetine increase to 40 mg BID from 20 TID - Abilify increased to 5 mg from 2 mg    Procedures:  12/4- stereotactic radiation to brain and spinal metastasis   11/24 Right axilla lymph node biopsy.  Consultations:  Oncology  Neurosurgery  Radiation Radiology  Discharge Exam: Filed Weights   08/09/14 0446  Weight: 100.9 kg (222 lb 7.1 oz)   Filed Vitals:   08/22/14 0559  BP: 153/73  Pulse: 74  Temp: 97.9 F (36.6 C)  Resp: 16     Physical Exam:  Gen: AAO x 3  Lungs: Normal respiratory effort, bilateral clear to auscultation, no crackles or wheezes. Pulse ox 94% on 2 L of oxygen.  Heart: Regular rate and rhythm, S1 and S2 normal, no murmurs, rubs auscultated  Abdomen: BS normoactive,soft,nondistended,non-tender to palpation,no organomegaly  Extremities: No pretibial edema, no erythema, no cyanosis, no clubbing   Discharge Instructions You were cared for by a hospitalist during your hospital stay. If you have any questions about your discharge medications or the care you received while you were in the hospital after you are discharged, you can call the unit and asked to speak with the hospitalist on call if the hospitalist that took care of you is not available. Once you are discharged, your primary care physician will handle any further medical issues. Please note that NO REFILLS for any discharge medications will be authorized once you are discharged, as it is imperative that you return to your primary care physician (or establish a relationship with a primary care physician if you do not have one) for your aftercare needs so that  they can reassess your need for medications and monitor your lab values.      Discharge Instructions    Diet - low sodium heart healthy    Complete by:  As directed      Increase activity slowly    Complete by:  As directed             Medication List    STOP taking these medications        buPROPion 150 MG 24 hr tablet  Commonly known as:  WELLBUTRIN XL     methocarbamol 500 MG tablet  Commonly known as:  ROBAXIN     predniSONE 10 MG tablet  Commonly known as:  DELTASONE     valsartan-hydrochlorothiazide 80-12.5 MG per tablet  Commonly known as:  DIOVAN-HCT      TAKE these medications        ARIPiprazole 2 MG tablet  Commonly known as:  ABILIFY  Take 2 mg by mouth daily.     cyclobenzaprine 10 MG tablet  Commonly known as:  FLEXERIL  Take 1 tablet (10 mg total) by mouth at bedtime.     DSS 100 MG Caps  Take 200 mg by mouth 2 (two) times daily.     esomeprazole 20 MG capsule  Commonly known as:  NEXIUM  Take  20 mg by mouth every morning.     ferrous sulfate 325 (65 FE) MG tablet  Take 1 tablet (325 mg total) by mouth 2 (two) times daily with a meal.     FLUoxetine 40 MG capsule  Commonly known as:  PROZAC  Take 1 capsule (40 mg total) by mouth 2 (two) times daily.     irbesartan 75 MG tablet  Commonly known as:  AVAPRO  Take 1 tablet (75 mg total) by mouth daily.     ondansetron 4 MG disintegrating tablet  Commonly known as:  ZOFRAN-ODT  Take 1 tablet (4 mg total) by mouth every 8 (eight) hours as needed for nausea or vomiting.     Oxycodone HCl 10 MG Tabs  Take 1 tablet (10 mg total) by mouth every 4 (four) hours as needed for severe pain (continue this and attempt to wean Dilaudid PCA).     OxyCODONE HCl ER 30 MG T12a  Take 30 mg by mouth every 8 (eight) hours.     polyethylene glycol packet  Commonly known as:  MIRALAX / GLYCOLAX  Take 17 g by mouth daily as needed for mild constipation (give in AM PRN).     senna 8.6 MG Tabs tablet   Commonly known as:  SENOKOT  Take 1 tablet (8.6 mg total) by mouth at bedtime as needed for mild constipation.     simvastatin 40 MG tablet  Commonly known as:  ZOCOR  Take 40 mg by mouth at bedtime.     tiotropium 18 MCG inhalation capsule  Commonly known as:  SPIRIVA  Place 18 mcg into inhaler and inhale daily.     zolpidem 10 MG tablet  Commonly known as:  AMBIEN  Take 0.5-1 tablets (5-10 mg total) by mouth at bedtime as needed for sleep.       No Known Allergies    The results of significant diagnostics from this hospitalization (including imaging, microbiology, ancillary and laboratory) are listed below for reference.    Significant Diagnostic Studies: Dg Chest 2 View  08/15/2014   CLINICAL DATA:  63 year old female with shortness of Breath. Initial encounter. Hypertension, pulmonary edema. Bone metastases and mediastinal lymphadenopathy, history of breast cancer.  EXAM: CHEST  2 VIEW  COMPARISON:  08/14/2014 and earlier.  FINDINGS: Semi upright AP and lateral views of the chest. Stable cardiac size and mediastinal contours. Interval regressed indistinct perihilar opacity. No pneumothorax. No pulmonary edema. There are small bibasilar effusions, or continued confluent bibasilar atelectasis as was seen on the CT 08/08/2014. Visualized tracheal air column is within normal limits. Stable visualized osseous structures.  IMPRESSION: 1. Regressed bilateral perihilar opacity since yesterday. No new cardiopulmonary abnormality. 2. Bibasilar pleural effusions or confluent atelectasis.   Electronically Signed   By: Lars Pinks M.D.   On: 08/15/2014 14:37   Dg Chest 2 View  08/14/2014   CLINICAL DATA:  Hypoxia.  EXAM: CHEST  2 VIEW  COMPARISON:  CT scan of August 08, 2014.  FINDINGS: Borderline cardiomegaly is noted. Bilateral perihilar interstitial densities are noted concerning for edema. Minimal bilateral pleural effusions are noted posteriorly. No pneumothorax is noted. Bony thorax  appears intact.  IMPRESSION: Bilateral perihilar edema is noted with minimal associated pleural effusions.   Electronically Signed   By: Sabino Dick M.D.   On: 08/14/2014 09:53   Dg Lumbar Spine Complete  08/04/2014   CLINICAL DATA:  Low back pain, unspecified back pain lateral laterality, with sciatica presence unspecified.  EXAM: LUMBAR SPINE -  COMPLETE 4+ VIEW  COMPARISON:  None.  FINDINGS: Diffuse osteopenia is noted. No fracture or spondylolisthesis is noted. Atherosclerotic calcifications of abdominal aorta are noted. Disc spaces are well-maintained. Posterior facet joints appear normal. Mild dextroscoliosis of upper lumbar spine is noted.  IMPRESSION: No acute abnormality seen in the lumbar spine.   Electronically Signed   By: Sabino Dick M.D.   On: 08/04/2014 15:47   Ct Chest W Contrast  08/08/2014   CLINICAL DATA:  Mediastinal lymphadenopathy on thoracic spine MRI. History of breast cancer.  EXAM: CT CHEST, ABDOMEN, AND PELVIS WITH CONTRAST  TECHNIQUE: Multidetector CT imaging of the chest, abdomen and pelvis was performed following the standard protocol during bolus administration of intravenous contrast.  CONTRAST:  131m OMNIPAQUE IOHEXOL 300 MG/ML  SOLN  COMPARISON:  Thoracic and lumbar spine MRI dated 08/08/2014  FINDINGS: CT CHEST FINDINGS  Mild patchy/ground-glass opacities in the left upper lobe (series 5/image 15) and right upper lobe (for example, series 5/ image 18), suspicious for mild pneumonia.  Mild dependent atelectasis in the bilateral lower lobes. No pleural effusion or pneumothorax.  Visualized thyroid is unremarkable.  Heart is normal in size. No pericardial effusion. Mild atherosclerotic calcifications of the aortic arch.  Thoracic lymphadenopathy, including:  --12 mm short axis right supraclavicular node (series 2/ image 1)  --26 mm short axis high right paratracheal node (series 2/ image 12)  --17 mm short axis right axillary node  --22 mm short axis partially necrotic  subcarinal node (series 2/image 26)  Status post left mastectomy with reconstruction.  Mild superior endplate compression fracture deformity at T10 with associated underlying lytic lesion along the posterior vertebral body (sagittal image 59), better evaluated on MRI. Mild retropulsion.  CT ABDOMEN AND PELVIS FINDINGS  Hepatobiliary: Liver is within normal limits. No suspicious/enhancing hepatic lesions.  Layering gallstones (series 2/image 54), without associated inflammatory changes.  Pancreas: Within normal limits.  Spleen: Within normal limits.  Adrenals/Urinary Tract: Adrenal glands are unremarkable.  Kidneys are within normal limits.  No hydronephrosis.  Bladder is notable for layering excretory MRI contrast.  Stomach/Bowel: Moderate hiatal hernia. Stomach is otherwise within normal limits.  No evidence of bowel obstruction.  Suspected prior appendectomy.  Prior distal colonic resection with anastomosis in the left pelvis (series 2/image 94).  Vascular/Lymphatic: Atherosclerotic calcifications of the abdominal aorta and branch vessels.  No suspicious abdominopelvic lymphadenopathy.  Reproductive: Status post hysterectomy.  No adnexal masses.  Other: No abdominopelvic ascites.  Musculoskeletal: Mild superior endplate compression fracture deformity at L3 with associated lytic lesion along the anterior vertebral body (sagittal image 27). No retropulsion.  Lucencies involving the sacrum likely reflect focal fat when correlating with MRI.  IMPRESSION: Thoracic lymphadenopathy, including right supraclavicular and axillary lymphadenopathy, as above. Primary differential considerations include nonvisualized (right) breast or lung cancer, or possibly lymphoma.  No suspicious lymphadenopathy in the abdomen or pelvis.  Pathologic fractures involving T10 and L3, better evaluated on MRI.   Electronically Signed   By: SJulian HyM.D.   On: 08/08/2014 15:09   Mr BJeri CosWSWContrast  08/16/2014   CLINICAL DATA:  Non  small cell lung cancer.  EXAM: MRI HEAD WITHOUT AND WITH CONTRAST  TECHNIQUE: Multiplanar, multiecho pulse sequences of the brain and surrounding structures were obtained without and with intravenous contrast.  CONTRAST:  232mMULTIHANCE GADOBENATE DIMEGLUMINE 529 MG/ML IV SOLN  COMPARISON:  None.  FINDINGS: A 5 x 6 mm enhancing mass lesion is present posteriorly in the left temporal or  parietal lobe. There is surrounding vasogenic edema. No additional enhancing lesions are present. No other significant white matter disease is evident. No acute infarct or hemorrhage is evident.  Flow is present in the major intracranial arteries. The paranasal sinuses are clear. There is minimal fluid in the inferior mastoid air cells. No obstructing nasopharyngeal lesion is evident. Midline structures are normal. The upper cervical spine and skullbase marrow components are within normal limits.  IMPRESSION: 1. 5 x 6 mm enhancing lesion with surrounding vasogenic edema within the posterior left temporal or inferior parietal lobe is compatible with a focal metastasis. No other enhancing lesions are evident.   Electronically Signed   By: Lawrence Santiago M.D.   On: 08/16/2014 12:53   Mr Thoracic Spine W Wo Contrast  08/08/2014   CLINICAL DATA:  Progressive low back pain over the last 2 weeks with radiation into the leg and difficulty ambulating. History of breast cancer. Initial encounter.  EXAM: MRI THORACIC AND LUMBAR SPINE WITHOUT AND WITH CONTRAST  TECHNIQUE: Multiplanar and multiecho pulse sequences of the thoracic and lumbar spine were obtained without and with intravenous contrast.  CONTRAST:  62m MULTIHANCE GADOBENATE DIMEGLUMINE 529 MG/ML IV SOLN  COMPARISON:  Lumbar spine radiographs 08/04/2014.  FINDINGS: MR THORACIC SPINE FINDINGS  Sagittal localizing images demonstrate no definite abnormalities within the cervical spine. There is diffuse replacement of the normal marrow signal within the T10 vertebral body associated  with a mild superior endplate compression deformity and mild osseous retropulsion on the right. Post-contrast, there is diffuse enhancement of the T10 vertebral body. Appearance is highly worrisome for metastatic disease with a pathologic fracture. There may be a small amount of enhancing epidural tumor on the right. No cord compression is present.  No other osseous metastases or fractures are seen within the thoracic spine.  The thoracic cord is normal in signal and caliber. There is no abnormal intradural enhancement.  There is extensive mediastinal lymphadenopathy. There is a large right paratracheal mass measuring 4.5 x 3.5 cm. There is a subcarinal nodal mass measuring 2.4 x 3.5 cm. There is probable lower esophageal adenopathy as well.  There is minimal thoracic spondylosis without disc herniation or foraminal compromise.  MR LUMBAR SPINE FINDINGS  There are 5 lumbar type vertebral bodies. There is superior endplate Schmorl's node at L3. Bone marrow edema and enhancement are present throughout the L3 vertebral body. There is no osseous retropulsion or epidural tumor. Although less concerning than the findings at T10, this likely represents metastatic disease as well.  No other osseous metastases or fractures are seen within the lumbar spine.  The conus medullaris extends to the L1-2 disc space level. There is no abnormal intradural enhancement. No paraspinal mass is demonstrated.  There is mild lumbar spondylosis. At L2-3 and L3-4, there is mild disc bulging without resulting spinal stenosis or nerve root encroachment. At L4-5, the disc bulging is eccentric laterally to the left but does not causes any L4 nerve root encroachment. Disc degeneration and osteophytes at L5-S1 are asymmetric to the right. There are mild facet degenerative changes throughout the lower lumbar spine.  IMPRESSION: 1. Mediastinal lymphadenopathy highly worrisome for metastatic disease. This pattern is atypical for metastatic breast  cancer and is more suggestive of lung cancer or possibly lymphoma. CT (at least of the chest; consider including the abdomen and pelvis) recommended for further evaluation. 2. Fractures at T10 and L3 are both suspicious for pathologic fractures from underlying metastatic disease. There is possibly a small amount of  epidural tumor associated with the T10 fracture. No cord compression or abnormal intradural enhancement identified. 3. These results were called by telephone at the time of interpretation on 08/08/2014 at 12:14 pm to Dr. Joseph Berkshire , who verbally acknowledged these results.   Electronically Signed   By: Camie Patience M.D.   On: 08/08/2014 12:16   Mr Lumbar Spine W Wo Contrast  08/08/2014   CLINICAL DATA:  Progressive low back pain over the last 2 weeks with radiation into the leg and difficulty ambulating. History of breast cancer. Initial encounter.  EXAM: MRI THORACIC AND LUMBAR SPINE WITHOUT AND WITH CONTRAST  TECHNIQUE: Multiplanar and multiecho pulse sequences of the thoracic and lumbar spine were obtained without and with intravenous contrast.  CONTRAST:  83m MULTIHANCE GADOBENATE DIMEGLUMINE 529 MG/ML IV SOLN  COMPARISON:  Lumbar spine radiographs 08/04/2014.  FINDINGS: MR THORACIC SPINE FINDINGS  Sagittal localizing images demonstrate no definite abnormalities within the cervical spine. There is diffuse replacement of the normal marrow signal within the T10 vertebral body associated with a mild superior endplate compression deformity and mild osseous retropulsion on the right. Post-contrast, there is diffuse enhancement of the T10 vertebral body. Appearance is highly worrisome for metastatic disease with a pathologic fracture. There may be a small amount of enhancing epidural tumor on the right. No cord compression is present.  No other osseous metastases or fractures are seen within the thoracic spine.  The thoracic cord is normal in signal and caliber. There is no abnormal  intradural enhancement.  There is extensive mediastinal lymphadenopathy. There is a large right paratracheal mass measuring 4.5 x 3.5 cm. There is a subcarinal nodal mass measuring 2.4 x 3.5 cm. There is probable lower esophageal adenopathy as well.  There is minimal thoracic spondylosis without disc herniation or foraminal compromise.  MR LUMBAR SPINE FINDINGS  There are 5 lumbar type vertebral bodies. There is superior endplate Schmorl's node at L3. Bone marrow edema and enhancement are present throughout the L3 vertebral body. There is no osseous retropulsion or epidural tumor. Although less concerning than the findings at T10, this likely represents metastatic disease as well.  No other osseous metastases or fractures are seen within the lumbar spine.  The conus medullaris extends to the L1-2 disc space level. There is no abnormal intradural enhancement. No paraspinal mass is demonstrated.  There is mild lumbar spondylosis. At L2-3 and L3-4, there is mild disc bulging without resulting spinal stenosis or nerve root encroachment. At L4-5, the disc bulging is eccentric laterally to the left but does not causes any L4 nerve root encroachment. Disc degeneration and osteophytes at L5-S1 are asymmetric to the right. There are mild facet degenerative changes throughout the lower lumbar spine.  IMPRESSION: 1. Mediastinal lymphadenopathy highly worrisome for metastatic disease. This pattern is atypical for metastatic breast cancer and is more suggestive of lung cancer or possibly lymphoma. CT (at least of the chest; consider including the abdomen and pelvis) recommended for further evaluation. 2. Fractures at T10 and L3 are both suspicious for pathologic fractures from underlying metastatic disease. There is possibly a small amount of epidural tumor associated with the T10 fracture. No cord compression or abnormal intradural enhancement identified. 3. These results were called by telephone at the time of interpretation  on 08/08/2014 at 12:14 pm to Dr. CJoseph Berkshire, who verbally acknowledged these results.   Electronically Signed   By: BCamie PatienceM.D.   On: 08/08/2014 12:16   Ct Abdomen Pelvis W Contrast  08/08/2014   CLINICAL DATA:  Mediastinal lymphadenopathy on thoracic spine MRI. History of breast cancer.  EXAM: CT CHEST, ABDOMEN, AND PELVIS WITH CONTRAST  TECHNIQUE: Multidetector CT imaging of the chest, abdomen and pelvis was performed following the standard protocol during bolus administration of intravenous contrast.  CONTRAST:  17m OMNIPAQUE IOHEXOL 300 MG/ML  SOLN  COMPARISON:  Thoracic and lumbar spine MRI dated 08/08/2014  FINDINGS: CT CHEST FINDINGS  Mild patchy/ground-glass opacities in the left upper lobe (series 5/image 15) and right upper lobe (for example, series 5/ image 18), suspicious for mild pneumonia.  Mild dependent atelectasis in the bilateral lower lobes. No pleural effusion or pneumothorax.  Visualized thyroid is unremarkable.  Heart is normal in size. No pericardial effusion. Mild atherosclerotic calcifications of the aortic arch.  Thoracic lymphadenopathy, including:  --12 mm short axis right supraclavicular node (series 2/ image 1)  --26 mm short axis high right paratracheal node (series 2/ image 12)  --17 mm short axis right axillary node  --22 mm short axis partially necrotic subcarinal node (series 2/image 26)  Status post left mastectomy with reconstruction.  Mild superior endplate compression fracture deformity at T10 with associated underlying lytic lesion along the posterior vertebral body (sagittal image 59), better evaluated on MRI. Mild retropulsion.  CT ABDOMEN AND PELVIS FINDINGS  Hepatobiliary: Liver is within normal limits. No suspicious/enhancing hepatic lesions.  Layering gallstones (series 2/image 54), without associated inflammatory changes.  Pancreas: Within normal limits.  Spleen: Within normal limits.  Adrenals/Urinary Tract: Adrenal glands are unremarkable.   Kidneys are within normal limits.  No hydronephrosis.  Bladder is notable for layering excretory MRI contrast.  Stomach/Bowel: Moderate hiatal hernia. Stomach is otherwise within normal limits.  No evidence of bowel obstruction.  Suspected prior appendectomy.  Prior distal colonic resection with anastomosis in the left pelvis (series 2/image 94).  Vascular/Lymphatic: Atherosclerotic calcifications of the abdominal aorta and branch vessels.  No suspicious abdominopelvic lymphadenopathy.  Reproductive: Status post hysterectomy.  No adnexal masses.  Other: No abdominopelvic ascites.  Musculoskeletal: Mild superior endplate compression fracture deformity at L3 with associated lytic lesion along the anterior vertebral body (sagittal image 27). No retropulsion.  Lucencies involving the sacrum likely reflect focal fat when correlating with MRI.  IMPRESSION: Thoracic lymphadenopathy, including right supraclavicular and axillary lymphadenopathy, as above. Primary differential considerations include nonvisualized (right) breast or lung cancer, or possibly lymphoma.  No suspicious lymphadenopathy in the abdomen or pelvis.  Pathologic fractures involving T10 and L3, better evaluated on MRI.   Electronically Signed   By: SJulian HyM.D.   On: 08/08/2014 15:09    Microbiology: No results found for this or any previous visit (from the past 240 hour(s)).   Labs: Basic Metabolic Panel:  Recent Labs Lab 08/16/14 0448 08/17/14 0415 08/18/14 0422 08/19/14 0503  NA 137 136* 135* 136*  K 3.2* 3.3* 4.5 4.0  CL 96 93* 94* 98  CO2 _0 GLUCOSE 103* 118* 106* 134*  BUN _1 CREATININE 1.02 0.99 0.87 0.79  CALCIUM 9.0 9.0 9.3 8.9  MG  --  2.1  --   --    Liver Function Tests: No results for input(s): AST, ALT, ALKPHOS, BILITOT, PROT, ALBUMIN in the last 168 hours. No results for input(s): LIPASE, AMYLASE in the last 168 hours. No results for input(s): AMMONIA in the last 168  hours. CBC:  Recent Labs Lab 08/19/14 0503  WBC 9.4  HGB 9.4*  HCT 28.6*  MCV  88.5  PLT 289   Cardiac Enzymes: No results for input(s): CKTOTAL, CKMB, CKMBINDEX, TROPONINI in the last 168 hours. BNP: BNP (last 3 results)  Recent Labs  08/15/14 0425  PROBNP 114.8   CBG: No results for input(s): GLUCAP in the last 168 hours.     SignedDebbe Odea, MD [facility] 08/22/2014, 12:05 PM

## 2014-08-22 NOTE — Progress Notes (Signed)
Physical Therapy Treatment Patient Details Name: Emily [facility] MRN: [id number] DOB: March 28, 1951 Today's Date: 08/22/2014    History of Present Illness Pt s/p 2 weeks increasing back pain with pathologic fxs at T10 and L3    PT Comments    Assisted pt OOB "log roll" then applied brace EOB.  Assisted with amb full unit on RA (see previous PN)  Assisted to BR then positioned in recliner chair.  Follow Up Recommendations  Home health PT;Supervision/Assistance - 24 hour     Equipment Recommendations  Rolling walker with 5" wheels;3in1 (PT);Hospital bed    Recommendations for Other Services       Precautions / Restrictions Precautions Precautions: Fall Precaution Comments: back Required Braces or Orthoses: Spinal Brace Spinal Brace: Thoracolumbosacral orthotic;Applied in sitting position Restrictions Weight Bearing Restrictions: No    Mobility  Bed Mobility Overal bed mobility: Needs Assistance Bed Mobility: Rolling;Sidelying to Sit Rolling: Min guard Sidelying to sit: Min guard       General bed mobility comments: 25% VC's on proper tech to "log roll" and avoid twisting  Transfers                    Ambulation/Gait Ambulation/Gait assistance: Supervision Ambulation Distance (Feet): 500 Feet Assistive device: Rolling walker (2 wheeled) Gait Pattern/deviations: Step-through pattern;Decreased stride length;Trunk flexed Gait velocity: decreased   General Gait Details: Amb on RA (see previous note) pt tolerated amb full unit.     Stairs            Wheelchair Mobility    Modified Rankin (Stroke Patients Only)       Balance                                    Cognition                            Exercises      General Comments        Pertinent Vitals/Pain Pain Assessment: No/denies pain    Home Living                      Prior Function            PT Goals (current goals can now be found in  the care plan section) Progress towards PT goals: Progressing toward goals    Frequency       PT Plan Current plan remains appropriate    Co-evaluation             End of Session Equipment Utilized During Treatment: Back brace;Oxygen;Gait belt Activity Tolerance: Patient tolerated treatment well Patient left: in chair;with call bell/phone within reach     Time: 1121-1200 PT Time Calculation (min) (ACUTE ONLY): 39 min  Charges:  $Gait Training: 23-37 mins $Therapeutic Activity: 8-22 mins                    G Codes:      Rica Koyanagi  PTA [facility]  Acute  Rehab Pager      [phone]

## 2014-08-22 NOTE — Progress Notes (Signed)
Clinical Social Work  Patient has been assessed by psych MD who has made medication recommendations. Psych CSW is signing off but available if further needs arise.  [place], [place] [phone]

## 2014-08-22 NOTE — Care Management Note (Addendum)
    Page 1 of 2   08/22/2014     11:51:42 AM CARE MANAGEMENT NOTE 08/22/2014  Patient:  Brenes,Tryniti K   Account Number:  [id number]  Date Initiated:  08/11/2014  Documentation initiated by:  Sunday Spillers  Subjective/Objective Assessment:   63 yo female admitted with severe back pain in the setting of pathological fractures. PTA lived at home with spouse.     Action/Plan:   Home when stable   Anticipated DC Date:  08/18/2014   Anticipated DC Plan:  [place]  CM consult      [place]   Choice offered to / List presented to:  C-3 Spouse   DME arranged  3-N-1  [place]      DME agency  [place] arranged  HH-1 RN  [place] agency  [facility]   Status of service:  Completed, signed off Medicare Important Message given?   (If response is "NO", the following Medicare IM given date fields will be blank) Date Medicare IM given:   Medicare IM given by:   Date Additional Medicare IM given:   Additional Medicare IM given by:    Discharge Disposition:  [place]  Per UR Regulation:  Reviewed for med. necessity/level of care/duration of stay  If discussed at [place] of Stay Meetings, dates discussed:    Comments:  08-22-14 [place] [address] with patient and family on multiple occasions. Provided list of [facility] agency for choice. Family reviewed list and after several days of deliberation decided on [facility]. Contacted Mary with [facility] to arrange [facility], anticipate d/c in am. DME orders entered, notiifed [facility].

## 2014-08-22 NOTE — Plan of Care (Signed)
Problem: Phase III Progression Outcomes Goal: Activity at appropriate level-compared to baseline (UP IN CHAIR FOR HEMODIALYSIS)  Outcome: Adequate for Discharge Goal: IV/normal saline lock discontinued Outcome: Completed/Met Date Met:  08/22/14

## 2014-08-22 NOTE — Progress Notes (Signed)
SATURATION QUALIFICATIONS: (This note is used to comply with regulatory documentation for home oxygen)  Patient Saturations on Room Air at Rest = 90%  Patient Saturations on Room Air while Ambulating = 83%  Patient Saturations on 2 Liters of oxygen while Ambulating = 87%                                        3 liters                                 91%  Please briefly explain why patient needs home oxygen:   Pt requires supp oxygen to maintain therapeutic levels   Rica Koyanagi  PTA [facility]  Acute  Rehab Pager      [phone]

## 2014-08-22 NOTE — Telephone Encounter (Signed)
askign about next appt . Being discharged from hospital tomorrow. Per [name] he will see her in 1 week. Onc Tx request sent.

## 2014-08-22 NOTE — Plan of Care (Signed)
Problem: Phase II Progression Outcomes Goal: Other Phase II Outcomes/Goals Outcome: Completed/Met Date Met:  08/22/14

## 2014-08-23 NOTE — Progress Notes (Signed)
  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  Left Parietal 6 mm target was treated using 3 Dynamic Conformal Arcs to a prescription dose of 20 Gy. ExacTrac registration was performed for each couch angle. The 82% isodose line was prescribed. 6 MV X-rays were delivered in the flattening filter free beam mode.  2.  The targeted metastasis in the T10 vertebral body was treated using 2 Rapid Arc VMAT Beams to a prescription dose of 18 Gy.  3.  The targeted metastasis in the L3 vertebral body was treated using 2 Rapid Arc VMAT Beams to a prescription dose of 18 Gy.  Narrative: The patient tolerated radiation treatment relatively well.   No acute complications were noted.  Plan: The patient has completed radiation treatment. The patient will return to radiation oncology clinic for routine followup in one month. I advised them to call or return sooner if they have any questions or concerns related to their recovery or treatment. ________________________________  [name]. [name], M.D.

## 2014-08-25 ENCOUNTER — Telehealth: Payer: Self-pay | Admitting: Medical Oncology

## 2014-08-25 NOTE — Telephone Encounter (Signed)
D/C from hospital . Dx with lung and pathological fracture T 10, L 3.  Hospitalist Rizwan ordered PT for gait training, spinal precautions, general strength and home exercises, mobility assistance, pursed lip breathing. She also requests pulse ox to check O2 sat and OT evaluation. Note to [place].

## 2014-08-28 ENCOUNTER — Other Ambulatory Visit: Payer: Self-pay | Admitting: Medical Oncology

## 2014-08-28 ENCOUNTER — Other Ambulatory Visit: Payer: Self-pay | Admitting: *Deleted

## 2014-08-28 DIAGNOSIS — C771 Secondary and unspecified malignant neoplasm of intrathoracic lymph nodes: Secondary | ICD-10-CM

## 2014-08-28 MED ORDER — OXYCODONE HCL 10 MG PO TABS: 10.0000 mg | ORAL_TABLET | ORAL | Status: DC | PRN

## 2014-08-29 ENCOUNTER — Telehealth: Payer: Self-pay | Admitting: Internal Medicine

## 2014-08-29 NOTE — Telephone Encounter (Signed)
S/W PATIENT HUSBAND AND GAVE HOSP F/U APPT FOR 12/28 @ 11:15 W/DR. MOHAMED.

## 2014-09-01 ENCOUNTER — Ambulatory Visit: Payer: BC Managed Care – PPO

## 2014-09-01 ENCOUNTER — Encounter: Payer: Self-pay | Admitting: Internal Medicine

## 2014-09-01 ENCOUNTER — Telehealth: Payer: Self-pay | Admitting: Internal Medicine

## 2014-09-01 ENCOUNTER — Other Ambulatory Visit (HOSPITAL_BASED_OUTPATIENT_CLINIC_OR_DEPARTMENT_OTHER): Payer: BC Managed Care – PPO

## 2014-09-01 ENCOUNTER — Ambulatory Visit (HOSPITAL_BASED_OUTPATIENT_CLINIC_OR_DEPARTMENT_OTHER): Payer: BC Managed Care – PPO | Admitting: Internal Medicine

## 2014-09-01 VITALS — BP 115/58 | HR 92 | Temp 97.8°F | Resp 18 | Ht 65.0 in

## 2014-09-01 DIAGNOSIS — C3490 Malignant neoplasm of unspecified part of unspecified bronchus or lung: Secondary | ICD-10-CM

## 2014-09-01 DIAGNOSIS — C771 Secondary and unspecified malignant neoplasm of intrathoracic lymph nodes: Secondary | ICD-10-CM

## 2014-09-01 DIAGNOSIS — C7951 Secondary malignant neoplasm of bone: Secondary | ICD-10-CM

## 2014-09-01 DIAGNOSIS — C3491 Malignant neoplasm of unspecified part of right bronchus or lung: Secondary | ICD-10-CM

## 2014-09-01 LAB — CBC WITH DIFFERENTIAL/PLATELET
BASO%: 0.1 % (ref 0.0–2.0)
Basophils Absolute: 0 10*3/uL (ref 0.0–0.1)
EOS ABS: 0 10*3/uL (ref 0.0–0.5)
EOS%: 0.5 % (ref 0.0–7.0)
HCT: 34 % — ABNORMAL LOW (ref 34.8–46.6)
HGB: 10.6 g/dL — ABNORMAL LOW (ref 11.6–15.9)
LYMPH%: 13.7 % — AB (ref 14.0–49.7)
MCH: 27.7 pg (ref 25.1–34.0)
MCHC: 31.2 g/dL — ABNORMAL LOW (ref 31.5–36.0)
MCV: 89 fL (ref 79.5–101.0)
MONO#: 0.6 10*3/uL (ref 0.1–0.9)
MONO%: 7.4 % (ref 0.0–14.0)
NEUT#: 6.7 10*3/uL — ABNORMAL HIGH (ref 1.5–6.5)
NEUT%: 78.3 % — ABNORMAL HIGH (ref 38.4–76.8)
Platelets: 299 10*3/uL (ref 145–400)
RBC: 3.82 10*6/uL (ref 3.70–5.45)
RDW: 14.2 % (ref 11.2–14.5)
WBC: 8.6 10*3/uL (ref 3.9–10.3)
lymph#: 1.2 10*3/uL (ref 0.9–3.3)

## 2014-09-01 LAB — COMPREHENSIVE METABOLIC PANEL (CC13)
ALBUMIN: 2.5 g/dL — AB (ref 3.5–5.0)
ALT: 20 U/L (ref 0–55)
AST: 17 U/L (ref 5–34)
Alkaline Phosphatase: 172 U/L — ABNORMAL HIGH (ref 40–150)
Anion Gap: 11 mEq/L (ref 3–11)
BILIRUBIN TOTAL: 0.6 mg/dL (ref 0.20–1.20)
BUN: 13.5 mg/dL (ref 7.0–26.0)
CO2: 25 mEq/L (ref 22–29)
Calcium: 8.9 mg/dL (ref 8.4–10.4)
Chloride: 103 mEq/L (ref 98–109)
Creatinine: 0.8 mg/dL (ref 0.6–1.1)
EGFR: 76 mL/min/{1.73_m2} — AB (ref >90)
GLUCOSE: 120 mg/dL (ref 70–140)
Potassium: 4.8 mEq/L (ref 3.5–5.1)
SODIUM: 138 meq/L (ref 136–145)
Total Protein: 6.5 g/dL (ref 6.4–8.3)

## 2014-09-01 MED ORDER — CYANOCOBALAMIN 1000 MCG/ML IJ SOLN
1000.0000 ug | Freq: Once | INTRAMUSCULAR | Status: AC
  Administered 2014-09-01: 1000 ug via INTRAMUSCULAR

## 2014-09-01 MED ORDER — DEXAMETHASONE 4 MG PO TABS: ORAL_TABLET | ORAL | Status: DC

## 2014-09-01 MED ORDER — CYANOCOBALAMIN 1000 MCG/ML IJ SOLN
INTRAMUSCULAR | Status: AC
  Filled 2014-09-01: qty 1

## 2014-09-01 MED ORDER — FOLIC ACID 1 MG PO TABS: 1.0000 mg | ORAL_TABLET | Freq: Every day | ORAL | Status: DC

## 2014-09-01 MED ORDER — PROCHLORPERAZINE MALEATE 10 MG PO TABS: 10.0000 mg | ORAL_TABLET | Freq: Four times a day (QID) | ORAL | Status: DC | PRN

## 2014-09-01 NOTE — Patient Instructions (Signed)
Smoking Cessation, Tips for Success  If you are ready to quit smoking, congratulations! You have chosen to help yourself be healthier. Cigarettes bring nicotine, tar, carbon monoxide, and other irritants into your body. Your lungs, heart, and blood vessels will be able to work better without these poisons. There are many different ways to quit smoking. Nicotine gum, nicotine patches, a nicotine inhaler, or nicotine nasal spray can help with physical craving. Hypnosis, support groups, and medicines help break the habit of smoking.  WHAT THINGS CAN I DO TO MAKE QUITTING EASIER?   Here are some tips to help you quit for good:  · Pick a date when you will quit smoking completely. Tell all of your friends and family about your plan to quit on that date.  · Do not try to slowly cut down on the number of cigarettes you are smoking. Pick a quit date and quit smoking completely starting on that day.  · Throw away all cigarettes.    · Clean and remove all ashtrays from your home, work, and car.  · On a card, write down your reasons for quitting. Carry the card with you and read it when you get the urge to smoke.  · Cleanse your body of nicotine. Drink enough water and fluids to keep your urine clear or pale yellow. Do this after quitting to flush the nicotine from your body.  · Learn to predict your moods. Do not let a bad situation be your excuse to have a cigarette. Some situations in your life might tempt you into wanting a cigarette.  · Never have "just one" cigarette. It leads to wanting another and another. Remind yourself of your decision to quit.  · Change habits associated with smoking. If you smoked while driving or when feeling stressed, try other activities to replace smoking. Stand up when drinking your coffee. Brush your teeth after eating. Sit in a different chair when you read the paper. Avoid alcohol while trying to quit, and try to drink fewer caffeinated beverages. Alcohol and caffeine may urge you to  smoke.  · Avoid foods and drinks that can trigger a desire to smoke, such as sugary or spicy foods and alcohol.  · Ask people who smoke not to smoke around you.  · Have something planned to do right after eating or having a cup of coffee. For example, plan to take a walk or exercise.  · Try a relaxation exercise to calm you down and decrease your stress. Remember, you may be tense and nervous for the first 2 weeks after you quit, but this will pass.  · Find new activities to keep your hands busy. Play with a pen, coin, or rubber band. Doodle or draw things on paper.  · Brush your teeth right after eating. This will help cut down on the craving for the taste of tobacco after meals. You can also try mouthwash.    · Use oral substitutes in place of cigarettes. Try using lemon drops, carrots, cinnamon sticks, or chewing gum. Keep them handy so they are available when you have the urge to smoke.  · When you have the urge to smoke, try deep breathing.  · Designate your home as a nonsmoking area.  · If you are a heavy smoker, ask your health care provider about a prescription for nicotine chewing gum. It can ease your withdrawal from nicotine.  · Reward yourself. Set aside the cigarette money you save and buy yourself something nice.  · Look for   support from others. Join a support group or smoking cessation program. Ask someone at home or at work to help you with your plan to quit smoking.  · Always ask yourself, "Do I need this cigarette or is this just a reflex?" Tell yourself, "Today, I choose not to smoke," or "I do not want to smoke." You are reminding yourself of your decision to quit.  · Do not replace cigarette smoking with electronic cigarettes (commonly called e-cigarettes). The safety of e-cigarettes is unknown, and some may contain harmful chemicals.  · If you relapse, do not give up! Plan ahead and think about what you will do the next time you get the urge to smoke.  HOW WILL I FEEL WHEN I QUIT SMOKING?  You  may have symptoms of withdrawal because your body is used to nicotine (the addictive substance in cigarettes). You may crave cigarettes, be irritable, feel very hungry, cough often, get headaches, or have difficulty concentrating. The withdrawal symptoms are only temporary. They are strongest when you first quit but will go away within 10-14 days. When withdrawal symptoms occur, stay in control. Think about your reasons for quitting. Remind yourself that these are signs that your body is healing and getting used to being without cigarettes. Remember that withdrawal symptoms are easier to treat than the major diseases that smoking can cause.   Even after the withdrawal is over, expect periodic urges to smoke. However, these cravings are generally short lived and will go away whether you smoke or not. Do not smoke!  WHAT RESOURCES ARE AVAILABLE TO HELP ME QUIT SMOKING?  Your health care provider can direct you to community resources or hospitals for support, which may include:  · Group support.  · Education.  · Hypnosis.  · Therapy.  Document Released: 05/30/2004 Document Revised: 01/16/2014 Document Reviewed: 02/17/2013  Exit[employer]® Patient Information ©2015 [employer]. This information is not intended to replace advice given to you by your health care provider. Make sure you discuss any questions you have with your health care provider.

## 2014-09-01 NOTE — Progress Notes (Signed)
Checked in new pt with no financial concerns at this time.  Pt has Raquel's card for any billing or insurance questions or concerns.

## 2014-09-01 NOTE — Telephone Encounter (Signed)
Gave avs & cal for Dec/Jan. °

## 2014-09-01 NOTE — Progress Notes (Addendum)
[place] [phone]   [phone]  OFFICE PROGRESS NOTE  NNODI, Emily Burke, MD [place] [address]  DIAGNOSIS: Stage IV (Tx, N2, M1b) poorly differentiated non-small cell carcinoma with negative EGFR mutation and negative for gene translocation but positive PDL 1 expression (80%) diagnosed in November 2015.  PRIOR THERAPY:  1) Stereotactic radiotherapy to the metastatic bone lesions at T10 and L3 under the care of Dr. [name] and Dr. Vertell Limber. 2) stereotactic radiotherapy to a solitary brain lesion under the care of Dr. [name].  CURRENT THERAPY: Systemic chemotherapy with carboplatin for AUC of 5, Alimta 500 MG/M2 and Avastin 15 MG/KG every 3 weeks. First dose 09/13/2014.  INTERVAL HISTORY: Emily [facility] 63 y.o. female returns to the clinic today for follow-up visit accompanied by her husband. The patient was seen for initial evaluation during her hospitalization at [facility] when she was diagnosed with metastatic poorly differentiated non-small cell lung cancer. She had significant metastatic bone disease at T10 and L3. She underwent a stereotactic radiotherapy to this area and she is feeling much better. She continues to have intermittent back pain. The patient denied having any significant chest pain but has shortness of breath with exertion. She has no hemoptysis. She continues to wear chest braces.  She denied having any significant nausea or vomiting, no fever or chills. The patient is here today for evaluation and discussion of her treatment options.  MEDICAL HISTORY: Past Medical History  Diagnosis Date  . History of suicidal ideation   . HTN (hypertension)   . HLD (hyperlipidemia)   . Shortness of breath   . Anxiety and depression   . Menopause   . Benign hematuria   . Impaired fasting glucose   . Other abnormal glucose     ALLERGIES:  has No Known Allergies.  MEDICATIONS:  Current Outpatient  Prescriptions  Medication Sig Dispense Refill  . ARIPiprazole (ABILIFY) 2 MG tablet Take 2 mg by mouth daily.    . cyclobenzaprine (FLEXERIL) 10 MG tablet Take 1 tablet (10 mg total) by mouth at bedtime. 30 tablet 0  . docusate sodium 100 MG CAPS Take 200 mg by mouth 2 (two) times daily. 10 capsule 0  . esomeprazole (NEXIUM) 20 MG capsule Take 20 mg by mouth every morning.    . ferrous sulfate 325 (65 FE) MG tablet Take 1 tablet (325 mg total) by mouth 2 (two) times daily with a meal. 60 tablet 3  . FLUoxetine (PROZAC) 40 MG capsule Take 1 capsule (40 mg total) by mouth 2 (two) times daily. 80 capsule 0  . irbesartan (AVAPRO) 75 MG tablet Take 1 tablet (75 mg total) by mouth daily. 30 tablet 0  . ondansetron (ZOFRAN-ODT) 4 MG disintegrating tablet Take 1 tablet (4 mg total) by mouth every 8 (eight) hours as needed for nausea or vomiting. 20 tablet 0  . OxyCODONE 30 MG T12A Take 30 mg by mouth every 8 (eight) hours. 90 tablet 0  . Oxycodone HCl 10 MG TABS Take 1 tablet (10 mg total) by mouth every 4 (four) hours as needed. 30 tablet 0  . polyethylene glycol (MIRALAX / GLYCOLAX) packet Take 17 g by mouth daily as needed for mild constipation (give in AM PRN). 14 each 0  . senna (SENOKOT) 8.6 MG TABS tablet Take 1 tablet (8.6 mg total) by mouth at bedtime as needed for mild constipation. 120 each 0  . simvastatin (ZOCOR) 40 MG tablet  Take 40 mg by mouth at bedtime.     Marland Kitchen tiotropium (SPIRIVA) 18 MCG inhalation capsule Place 18 mcg into inhaler and inhale daily.    Marland Kitchen zolpidem (AMBIEN) 10 MG tablet Take 0.5-1 tablets (5-10 mg total) by mouth at bedtime as needed for sleep. 30 tablet 0   No current facility-administered medications for this visit.    SURGICAL HISTORY:  Past Surgical History  Procedure Laterality Date  . Colonoscopy  11/21/2003     rare early left-sided diverticula remaining, low sigmoid anastomosis, internal and external hemorrhoids,   . Cystoscopy    . Lymph node biopsy Right  08/09/2014    Procedure: Right axillary lymph node biopsy;  Surgeon: Alphonsa Overall, MD;  Location: [facility];  Service: General;  Laterality: Right;    REVIEW OF SYSTEMS:  Constitutional: positive for fatigue Eyes: negative Ears, nose, mouth, throat, and face: negative Respiratory: negative Cardiovascular: negative Gastrointestinal: negative Genitourinary:negative Integument/breast: negative Hematologic/lymphatic: negative Musculoskeletal:positive for back pain and muscle weakness Neurological: negative Behavioral/Psych: negative Endocrine: negative Allergic/Immunologic: negative   PHYSICAL EXAMINATION: General appearance: alert, cooperative, fatigued and no distress Head: Normocephalic, without obvious abnormality, atraumatic Neck: no adenopathy, no JVD, supple, symmetrical, trachea midline and thyroid not enlarged, symmetric, no tenderness/mass/nodules Lymph nodes: Cervical, supraclavicular, and axillary nodes normal. Resp: clear to auscultation bilaterally Back: symmetric, no curvature. ROM normal. No CVA tenderness. Cardio: regular rate and rhythm, S1, S2 normal, no murmur, click, rub or gallop GI: soft, non-tender; bowel sounds normal; no masses,  no organomegaly Extremities: extremities normal, atraumatic, no cyanosis or edema Neurologic: Alert and oriented X 3, normal strength and tone. Normal symmetric reflexes. Normal coordination and gait  ECOG PERFORMANCE STATUS: 1 - Symptomatic but completely ambulatory  Blood pressure 115/58, pulse 92, temperature 97.8 F (36.6 C), temperature source Oral, resp. rate 18, height '5\' 5"'  (1.651 m), weight 0 lb (0 kg), SpO2 100 %.  LABORATORY DATA: Lab Results  Component Value Date   WBC 8.6 09/01/2014   HGB 10.6* 09/01/2014   HCT 34.0* 09/01/2014   MCV 89.0 09/01/2014   PLT 299 09/01/2014      Chemistry      Component Value Date/Time   NA 136* 08/19/2014 0503   K 4.0 08/19/2014 0503   CL 98 08/19/2014 0503   CO2 27 08/19/2014  0503   BUN 17 08/19/2014 0503   CREATININE 0.79 08/19/2014 0503      Component Value Date/Time   CALCIUM 8.9 08/19/2014 0503   ALKPHOS 81 03/06/2007 0140   AST 15 03/06/2007 0140   ALT 12 03/06/2007 0140   BILITOT 0.7 03/06/2007 0140       RADIOGRAPHIC STUDIES: Dg Chest 2 View  08/15/2014   CLINICAL DATA:  63 year old female with shortness of Breath. Initial encounter. Hypertension, pulmonary edema. Bone metastases and mediastinal lymphadenopathy, history of breast cancer.  EXAM: CHEST  2 VIEW  COMPARISON:  08/14/2014 and earlier.  FINDINGS: Semi upright AP and lateral views of the chest. Stable cardiac size and mediastinal contours. Interval regressed indistinct perihilar opacity. No pneumothorax. No pulmonary edema. There are small bibasilar effusions, or continued confluent bibasilar atelectasis as was seen on the CT 08/08/2014. Visualized tracheal air column is within normal limits. Stable visualized osseous structures.  IMPRESSION: 1. Regressed bilateral perihilar opacity since yesterday. No new cardiopulmonary abnormality. 2. Bibasilar pleural effusions or confluent atelectasis.   Electronically Signed   By: Lars Pinks M.D.   On: 08/15/2014 14:37   Dg Chest 2 View  08/14/2014  CLINICAL DATA:  Hypoxia.  EXAM: CHEST  2 VIEW  COMPARISON:  CT scan of August 08, 2014.  FINDINGS: Borderline cardiomegaly is noted. Bilateral perihilar interstitial densities are noted concerning for edema. Minimal bilateral pleural effusions are noted posteriorly. No pneumothorax is noted. Bony thorax appears intact.  IMPRESSION: Bilateral perihilar edema is noted with minimal associated pleural effusions.   Electronically Signed   By: Sabino Dick M.D.   On: 08/14/2014 09:53   Dg Lumbar Spine Complete  08/04/2014   CLINICAL DATA:  Low back pain, unspecified back pain lateral laterality, with sciatica presence unspecified.  EXAM: LUMBAR SPINE - COMPLETE 4+ VIEW  COMPARISON:  None.  FINDINGS: Diffuse osteopenia  is noted. No fracture or spondylolisthesis is noted. Atherosclerotic calcifications of abdominal aorta are noted. Disc spaces are well-maintained. Posterior facet joints appear normal. Mild dextroscoliosis of upper lumbar spine is noted.  IMPRESSION: No acute abnormality seen in the lumbar spine.   Electronically Signed   By: Sabino Dick M.D.   On: 08/04/2014 15:47   Ct Chest W Contrast  08/08/2014   CLINICAL DATA:  Mediastinal lymphadenopathy on thoracic spine MRI. History of breast cancer.  EXAM: CT CHEST, ABDOMEN, AND PELVIS WITH CONTRAST  TECHNIQUE: Multidetector CT imaging of the chest, abdomen and pelvis was performed following the standard protocol during bolus administration of intravenous contrast.  CONTRAST:  134m OMNIPAQUE IOHEXOL 300 MG/ML  SOLN  COMPARISON:  Thoracic and lumbar spine MRI dated 08/08/2014  FINDINGS: CT CHEST FINDINGS  Mild patchy/ground-glass opacities in the left upper lobe (series 5/image 15) and right upper lobe (for example, series 5/ image 18), suspicious for mild pneumonia.  Mild dependent atelectasis in the bilateral lower lobes. No pleural effusion or pneumothorax.  Visualized thyroid is unremarkable.  Heart is normal in size. No pericardial effusion. Mild atherosclerotic calcifications of the aortic arch.  Thoracic lymphadenopathy, including:  --12 mm short axis right supraclavicular node (series 2/ image 1)  --26 mm short axis high right paratracheal node (series 2/ image 12)  --17 mm short axis right axillary node  --22 mm short axis partially necrotic subcarinal node (series 2/image 26)  Status post left mastectomy with reconstruction.  Mild superior endplate compression fracture deformity at T10 with associated underlying lytic lesion along the posterior vertebral body (sagittal image 59), better evaluated on MRI. Mild retropulsion.  CT ABDOMEN AND PELVIS FINDINGS  Hepatobiliary: Liver is within normal limits. No suspicious/enhancing hepatic lesions.  Layering  gallstones (series 2/image 54), without associated inflammatory changes.  Pancreas: Within normal limits.  Spleen: Within normal limits.  Adrenals/Urinary Tract: Adrenal glands are unremarkable.  Kidneys are within normal limits.  No hydronephrosis.  Bladder is notable for layering excretory MRI contrast.  Stomach/Bowel: Moderate hiatal hernia. Stomach is otherwise within normal limits.  No evidence of bowel obstruction.  Suspected prior appendectomy.  Prior distal colonic resection with anastomosis in the left pelvis (series 2/image 94).  Vascular/Lymphatic: Atherosclerotic calcifications of the abdominal aorta and branch vessels.  No suspicious abdominopelvic lymphadenopathy.  Reproductive: Status post hysterectomy.  No adnexal masses.  Other: No abdominopelvic ascites.  Musculoskeletal: Mild superior endplate compression fracture deformity at L3 with associated lytic lesion along the anterior vertebral body (sagittal image 27). No retropulsion.  Lucencies involving the sacrum likely reflect focal fat when correlating with MRI.  IMPRESSION: Thoracic lymphadenopathy, including right supraclavicular and axillary lymphadenopathy, as above. Primary differential considerations include nonvisualized (right) breast or lung cancer, or possibly lymphoma.  No suspicious lymphadenopathy in the abdomen or  pelvis.  Pathologic fractures involving T10 and L3, better evaluated on MRI.   Electronically Signed   By: Julian Hy M.D.   On: 08/08/2014 15:09   Mr Jeri Cos DG Contrast  08/16/2014   CLINICAL DATA:  Non small cell lung cancer.  EXAM: MRI HEAD WITHOUT AND WITH CONTRAST  TECHNIQUE: Multiplanar, multiecho pulse sequences of the brain and surrounding structures were obtained without and with intravenous contrast.  CONTRAST:  51m MULTIHANCE GADOBENATE DIMEGLUMINE 529 MG/ML IV SOLN  COMPARISON:  None.  FINDINGS: A 5 x 6 mm enhancing mass lesion is present posteriorly in the left temporal or parietal lobe. There is  surrounding vasogenic edema. No additional enhancing lesions are present. No other significant white matter disease is evident. No acute infarct or hemorrhage is evident.  Flow is present in the major intracranial arteries. The paranasal sinuses are clear. There is minimal fluid in the inferior mastoid air cells. No obstructing nasopharyngeal lesion is evident. Midline structures are normal. The upper cervical spine and skullbase marrow components are within normal limits.  IMPRESSION: 1. 5 x 6 mm enhancing lesion with surrounding vasogenic edema within the posterior left temporal or inferior parietal lobe is compatible with a focal metastasis. No other enhancing lesions are evident.   Electronically Signed   By: CLawrence SantiagoM.D.   On: 08/16/2014 12:53   Mr Thoracic Spine W Wo Contrast  08/08/2014   CLINICAL DATA:  Progressive low back pain over the last 2 weeks with radiation into the leg and difficulty ambulating. History of breast cancer. Initial encounter.  EXAM: MRI THORACIC AND LUMBAR SPINE WITHOUT AND WITH CONTRAST  TECHNIQUE: Multiplanar and multiecho pulse sequences of the thoracic and lumbar spine were obtained without and with intravenous contrast.  CONTRAST:  254mMULTIHANCE GADOBENATE DIMEGLUMINE 529 MG/ML IV SOLN  COMPARISON:  Lumbar spine radiographs 08/04/2014.  FINDINGS: MR THORACIC SPINE FINDINGS  Sagittal localizing images demonstrate no definite abnormalities within the cervical spine. There is diffuse replacement of the normal marrow signal within the T10 vertebral body associated with a mild superior endplate compression deformity and mild osseous retropulsion on the right. Post-contrast, there is diffuse enhancement of the T10 vertebral body. Appearance is highly worrisome for metastatic disease with a pathologic fracture. There may be a small amount of enhancing epidural tumor on the right. No cord compression is present.  No other osseous metastases or fractures are seen within the  thoracic spine.  The thoracic cord is normal in signal and caliber. There is no abnormal intradural enhancement.  There is extensive mediastinal lymphadenopathy. There is a large right paratracheal mass measuring 4.5 x 3.5 cm. There is a subcarinal nodal mass measuring 2.4 x 3.5 cm. There is probable lower esophageal adenopathy as well.  There is minimal thoracic spondylosis without disc herniation or foraminal compromise.  MR LUMBAR SPINE FINDINGS  There are 5 lumbar type vertebral bodies. There is superior endplate Schmorl's node at L3. Bone marrow edema and enhancement are present throughout the L3 vertebral body. There is no osseous retropulsion or epidural tumor. Although less concerning than the findings at T10, this likely represents metastatic disease as well.  No other osseous metastases or fractures are seen within the lumbar spine.  The conus medullaris extends to the L1-2 disc space level. There is no abnormal intradural enhancement. No paraspinal mass is demonstrated.  There is mild lumbar spondylosis. At L2-3 and L3-4, there is mild disc bulging without resulting spinal stenosis or nerve root encroachment. At L4-5,  the disc bulging is eccentric laterally to the left but does not causes any L4 nerve root encroachment. Disc degeneration and osteophytes at L5-S1 are asymmetric to the right. There are mild facet degenerative changes throughout the lower lumbar spine.  IMPRESSION: 1. Mediastinal lymphadenopathy highly worrisome for metastatic disease. This pattern is atypical for metastatic breast cancer and is more suggestive of lung cancer or possibly lymphoma. CT (at least of the chest; consider including the abdomen and pelvis) recommended for further evaluation. 2. Fractures at T10 and L3 are both suspicious for pathologic fractures from underlying metastatic disease. There is possibly a small amount of epidural tumor associated with the T10 fracture. No cord compression or abnormal intradural  enhancement identified. 3. These results were called by telephone at the time of interpretation on 08/08/2014 at 12:14 pm to Dr. Joseph Berkshire , who verbally acknowledged these results.   Electronically Signed   By: Camie Patience M.D.   On: 08/08/2014 12:16   Mr Lumbar Spine W Wo Contrast  08/08/2014   CLINICAL DATA:  Progressive low back pain over the last 2 weeks with radiation into the leg and difficulty ambulating. History of breast cancer. Initial encounter.  EXAM: MRI THORACIC AND LUMBAR SPINE WITHOUT AND WITH CONTRAST  TECHNIQUE: Multiplanar and multiecho pulse sequences of the thoracic and lumbar spine were obtained without and with intravenous contrast.  CONTRAST:  60m MULTIHANCE GADOBENATE DIMEGLUMINE 529 MG/ML IV SOLN  COMPARISON:  Lumbar spine radiographs 08/04/2014.  FINDINGS: MR THORACIC SPINE FINDINGS  Sagittal localizing images demonstrate no definite abnormalities within the cervical spine. There is diffuse replacement of the normal marrow signal within the T10 vertebral body associated with a mild superior endplate compression deformity and mild osseous retropulsion on the right. Post-contrast, there is diffuse enhancement of the T10 vertebral body. Appearance is highly worrisome for metastatic disease with a pathologic fracture. There may be a small amount of enhancing epidural tumor on the right. No cord compression is present.  No other osseous metastases or fractures are seen within the thoracic spine.  The thoracic cord is normal in signal and caliber. There is no abnormal intradural enhancement.  There is extensive mediastinal lymphadenopathy. There is a large right paratracheal mass measuring 4.5 x 3.5 cm. There is a subcarinal nodal mass measuring 2.4 x 3.5 cm. There is probable lower esophageal adenopathy as well.  There is minimal thoracic spondylosis without disc herniation or foraminal compromise.  MR LUMBAR SPINE FINDINGS  There are 5 lumbar type vertebral bodies. There is  superior endplate Schmorl's node at L3. Bone marrow edema and enhancement are present throughout the L3 vertebral body. There is no osseous retropulsion or epidural tumor. Although less concerning than the findings at T10, this likely represents metastatic disease as well.  No other osseous metastases or fractures are seen within the lumbar spine.  The conus medullaris extends to the L1-2 disc space level. There is no abnormal intradural enhancement. No paraspinal mass is demonstrated.  There is mild lumbar spondylosis. At L2-3 and L3-4, there is mild disc bulging without resulting spinal stenosis or nerve root encroachment. At L4-5, the disc bulging is eccentric laterally to the left but does not causes any L4 nerve root encroachment. Disc degeneration and osteophytes at L5-S1 are asymmetric to the right. There are mild facet degenerative changes throughout the lower lumbar spine.  IMPRESSION: 1. Mediastinal lymphadenopathy highly worrisome for metastatic disease. This pattern is atypical for metastatic breast cancer and is more suggestive of lung cancer or  possibly lymphoma. CT (at least of the chest; consider including the abdomen and pelvis) recommended for further evaluation. 2. Fractures at T10 and L3 are both suspicious for pathologic fractures from underlying metastatic disease. There is possibly a small amount of epidural tumor associated with the T10 fracture. No cord compression or abnormal intradural enhancement identified. 3. These results were called by telephone at the time of interpretation on 08/08/2014 at 12:14 pm to Dr. Joseph Berkshire , who verbally acknowledged these results.   Electronically Signed   By: Camie Patience M.D.   On: 08/08/2014 12:16   Ct Abdomen Pelvis W Contrast  08/08/2014   CLINICAL DATA:  Mediastinal lymphadenopathy on thoracic spine MRI. History of breast cancer.  EXAM: CT CHEST, ABDOMEN, AND PELVIS WITH CONTRAST  TECHNIQUE: Multidetector CT imaging of the chest,  abdomen and pelvis was performed following the standard protocol during bolus administration of intravenous contrast.  CONTRAST:  164m OMNIPAQUE IOHEXOL 300 MG/ML  SOLN  COMPARISON:  Thoracic and lumbar spine MRI dated 08/08/2014  FINDINGS: CT CHEST FINDINGS  Mild patchy/ground-glass opacities in the left upper lobe (series 5/image 15) and right upper lobe (for example, series 5/ image 18), suspicious for mild pneumonia.  Mild dependent atelectasis in the bilateral lower lobes. No pleural effusion or pneumothorax.  Visualized thyroid is unremarkable.  Heart is normal in size. No pericardial effusion. Mild atherosclerotic calcifications of the aortic arch.  Thoracic lymphadenopathy, including:  --12 mm short axis right supraclavicular node (series 2/ image 1)  --26 mm short axis high right paratracheal node (series 2/ image 12)  --17 mm short axis right axillary node  --22 mm short axis partially necrotic subcarinal node (series 2/image 26)  Status post left mastectomy with reconstruction.  Mild superior endplate compression fracture deformity at T10 with associated underlying lytic lesion along the posterior vertebral body (sagittal image 59), better evaluated on MRI. Mild retropulsion.  CT ABDOMEN AND PELVIS FINDINGS  Hepatobiliary: Liver is within normal limits. No suspicious/enhancing hepatic lesions.  Layering gallstones (series 2/image 54), without associated inflammatory changes.  Pancreas: Within normal limits.  Spleen: Within normal limits.  Adrenals/Urinary Tract: Adrenal glands are unremarkable.  Kidneys are within normal limits.  No hydronephrosis.  Bladder is notable for layering excretory MRI contrast.  Stomach/Bowel: Moderate hiatal hernia. Stomach is otherwise within normal limits.  No evidence of bowel obstruction.  Suspected prior appendectomy.  Prior distal colonic resection with anastomosis in the left pelvis (series 2/image 94).  Vascular/Lymphatic: Atherosclerotic calcifications of the  abdominal aorta and branch vessels.  No suspicious abdominopelvic lymphadenopathy.  Reproductive: Status post hysterectomy.  No adnexal masses.  Other: No abdominopelvic ascites.  Musculoskeletal: Mild superior endplate compression fracture deformity at L3 with associated lytic lesion along the anterior vertebral body (sagittal image 27). No retropulsion.  Lucencies involving the sacrum likely reflect focal fat when correlating with MRI.  IMPRESSION: Thoracic lymphadenopathy, including right supraclavicular and axillary lymphadenopathy, as above. Primary differential considerations include nonvisualized (right) breast or lung cancer, or possibly lymphoma.  No suspicious lymphadenopathy in the abdomen or pelvis.  Pathologic fractures involving T10 and L3, better evaluated on MRI.   Electronically Signed   By: SJulian HyM.D.   On: 08/08/2014 15:09    ASSESSMENT AND PLAN: This is a very pleasant 63years old white female recently diagnosed with metastatic poorly differentiated non-small cell lung cancer with negative EGFR mutation, negative for gene translocation and positive PDL 1 expression presented with mediastinal and axillary lymphadenopathy in addition to  metastatic bone lesions and solitary brain lesion. She status post a stereotactic radiotherapy to the bone and brain lesions. I will complete the staging workup by ordering a PET scan for further evaluation of her disease. I had a lengthy discussion with the patient and her husband today about her current disease is stage, prognosis and treatment options. I explained to the patient that she has incurable condition and on the treatment would be of palliative nature. I gave the patient the option of palliative systemic chemotherapy with carboplatin for AUC of 5, Alimta 500 MG/M2 and Avastin 15 MG/KG every 3 weeks versus palliative care and hospice referral. The patient is interested in proceeding with systemic chemotherapy. I discussed with her  the adverse effect of this treatment including but not limited to alopecia, myelosuppression, nausea and vomiting, peripheral neuropathy, pulmonary hemorrhage, GI perforation, and wound healing delay. She would like to proceed with the treatment as planned. Breasts the patient has evidence for disease progression after the first line treatment, she may be considered for immunotherapy. She is expected to start the first cycle of this treatment on 09/13/2014. The patient will receive vitamin B 12 injection today. I will call her pharmacy was prescription for Compazine 10 mg by mouth every 6 hours as needed for nausea, Decadron 4 mg by mouth twice a day the day before, day of and day after the chemotherapy in addition to folic acid 1 mg by mouth daily. For the metastatic bone disease, I would consider the patient for treatment with Xgeva after I receive dental clearance from her dentist. She will come back for follow-up visit with the start of the first cycle of her chemotherapy. The patient was advised to call immediately if she has any concerning symptoms. The patient voices understanding of current disease status and treatment options and is in agreement with the current care plan.  All questions were answered. The patient knows to call the clinic with any problems, questions or concerns. We can certainly see the patient much sooner if necessary.  I spent 35 minutes counseling the patient face to face. The total time spent in the appointment was 45 minutes.  Disclaimer: This note was dictated with voice recognition software. Similar sounding words can inadvertently be transcribed and may not be corrected upon review.

## 2014-09-04 ENCOUNTER — Other Ambulatory Visit: Payer: BC Managed Care – PPO

## 2014-09-04 ENCOUNTER — Other Ambulatory Visit: Payer: Self-pay | Admitting: *Deleted

## 2014-09-04 DIAGNOSIS — C771 Secondary and unspecified malignant neoplasm of intrathoracic lymph nodes: Secondary | ICD-10-CM

## 2014-09-04 MED ORDER — ALPRAZOLAM 0.5 MG PO TABS: 0.5000 mg | ORAL_TABLET | Freq: Three times a day (TID) | ORAL | Status: DC | PRN

## 2014-09-05 ENCOUNTER — Telehealth: Payer: Self-pay | Admitting: *Deleted

## 2014-09-05 NOTE — Telephone Encounter (Signed)
Pt's husband called stating that pt will be going to the dentist for dental clearance on 09/19/14.  1st inj was scheduled with chemo on 09/11/14.  Per Dr Vista Mink, okay to proceed with chemo but xgeva needs to be held until dental clearance is completed.  Gave pt's husband fax # so dentist can fax over a letter to Dr Vista Mink.

## 2014-09-06 ENCOUNTER — Telehealth: Payer: Self-pay | Admitting: Medical Oncology

## 2014-09-06 ENCOUNTER — Encounter (HOSPITAL_COMMUNITY): Payer: Self-pay | Admitting: Emergency Medicine

## 2014-09-06 ENCOUNTER — Inpatient Hospital Stay (HOSPITAL_COMMUNITY)
Admission: EM | Admit: 2014-09-06 | Discharge: 2014-09-21 | DRG: 871 | Disposition: A | Discharge status: Home / Self Care | Payer: BC Managed Care – PPO | Attending: Internal Medicine | Admitting: Internal Medicine

## 2014-09-06 ENCOUNTER — Emergency Department (HOSPITAL_COMMUNITY): Payer: BC Managed Care – PPO

## 2014-09-06 ENCOUNTER — Other Ambulatory Visit: Payer: Self-pay

## 2014-09-06 DIAGNOSIS — I10 Essential (primary) hypertension: Secondary | ICD-10-CM | POA: Diagnosis present

## 2014-09-06 DIAGNOSIS — C7951 Secondary malignant neoplasm of bone: Secondary | ICD-10-CM | POA: Diagnosis present

## 2014-09-06 DIAGNOSIS — J9601 Acute respiratory failure with hypoxia: Secondary | ICD-10-CM | POA: Diagnosis present

## 2014-09-06 DIAGNOSIS — F419 Anxiety disorder, unspecified: Secondary | ICD-10-CM | POA: Diagnosis present

## 2014-09-06 DIAGNOSIS — D630 Anemia in neoplastic disease: Secondary | ICD-10-CM | POA: Diagnosis present

## 2014-09-06 DIAGNOSIS — I959 Hypotension, unspecified: Secondary | ICD-10-CM | POA: Diagnosis present

## 2014-09-06 DIAGNOSIS — R0603 Acute respiratory distress: Secondary | ICD-10-CM

## 2014-09-06 DIAGNOSIS — E871 Hypo-osmolality and hyponatremia: Secondary | ICD-10-CM | POA: Diagnosis present

## 2014-09-06 DIAGNOSIS — Z515 Encounter for palliative care: Secondary | ICD-10-CM

## 2014-09-06 DIAGNOSIS — R0602 Shortness of breath: Secondary | ICD-10-CM | POA: Diagnosis present

## 2014-09-06 DIAGNOSIS — Z66 Do not resuscitate: Secondary | ICD-10-CM | POA: Diagnosis present

## 2014-09-06 DIAGNOSIS — C3491 Malignant neoplasm of unspecified part of right bronchus or lung: Secondary | ICD-10-CM

## 2014-09-06 DIAGNOSIS — Y95 Nosocomial condition: Secondary | ICD-10-CM | POA: Diagnosis present

## 2014-09-06 DIAGNOSIS — R6520 Severe sepsis without septic shock: Secondary | ICD-10-CM | POA: Diagnosis present

## 2014-09-06 DIAGNOSIS — J9600 Acute respiratory failure, unspecified whether with hypoxia or hypercapnia: Secondary | ICD-10-CM

## 2014-09-06 DIAGNOSIS — R0600 Dyspnea, unspecified: Secondary | ICD-10-CM

## 2014-09-06 DIAGNOSIS — T380X5A Adverse effect of glucocorticoids and synthetic analogues, initial encounter: Secondary | ICD-10-CM | POA: Diagnosis present

## 2014-09-06 DIAGNOSIS — C7931 Secondary malignant neoplasm of brain: Secondary | ICD-10-CM | POA: Diagnosis present

## 2014-09-06 DIAGNOSIS — R45851 Suicidal ideations: Secondary | ICD-10-CM | POA: Diagnosis present

## 2014-09-06 DIAGNOSIS — F17210 Nicotine dependence, cigarettes, uncomplicated: Secondary | ICD-10-CM | POA: Diagnosis present

## 2014-09-06 DIAGNOSIS — R531 Weakness: Secondary | ICD-10-CM

## 2014-09-06 DIAGNOSIS — J189 Pneumonia, unspecified organism: Secondary | ICD-10-CM | POA: Diagnosis present

## 2014-09-06 DIAGNOSIS — E785 Hyperlipidemia, unspecified: Secondary | ICD-10-CM | POA: Diagnosis present

## 2014-09-06 DIAGNOSIS — C3490 Malignant neoplasm of unspecified part of unspecified bronchus or lung: Secondary | ICD-10-CM | POA: Diagnosis present

## 2014-09-06 DIAGNOSIS — Z510 Encounter for antineoplastic radiation therapy: Secondary | ICD-10-CM

## 2014-09-06 DIAGNOSIS — R0902 Hypoxemia: Secondary | ICD-10-CM

## 2014-09-06 DIAGNOSIS — F329 Major depressive disorder, single episode, unspecified: Secondary | ICD-10-CM | POA: Diagnosis present

## 2014-09-06 DIAGNOSIS — A419 Sepsis, unspecified organism: Secondary | ICD-10-CM | POA: Diagnosis present

## 2014-09-06 DIAGNOSIS — Z9981 Dependence on supplemental oxygen: Secondary | ICD-10-CM

## 2014-09-06 HISTORY — DX: Malignant (primary) neoplasm, unspecified: C80.1

## 2014-09-06 LAB — CBC WITH DIFFERENTIAL/PLATELET
BASOS PCT: 0 % (ref 0–1)
Basophils Absolute: 0 10*3/uL (ref 0.0–0.1)
EOS ABS: 0.1 10*3/uL (ref 0.0–0.7)
Eosinophils Relative: 1 % (ref 0–5)
HEMATOCRIT: 31.4 % — AB (ref 36.0–46.0)
HEMOGLOBIN: 9.9 g/dL — AB (ref 12.0–15.0)
Lymphocytes Relative: 9 % — ABNORMAL LOW (ref 12–46)
Lymphs Abs: 1.1 10*3/uL (ref 0.7–4.0)
MCH: 27.9 pg (ref 26.0–34.0)
MCHC: 31.5 g/dL (ref 30.0–36.0)
MCV: 88.5 fL (ref 78.0–100.0)
MONO ABS: 1 10*3/uL (ref 0.1–1.0)
Monocytes Relative: 8 % (ref 3–12)
Neutro Abs: 9.8 10*3/uL — ABNORMAL HIGH (ref 1.7–7.7)
Neutrophils Relative %: 82 % — ABNORMAL HIGH (ref 43–77)
Platelets: 289 10*3/uL (ref 150–400)
RBC: 3.55 MIL/uL — ABNORMAL LOW (ref 3.87–5.11)
RDW: 14.7 % (ref 11.5–15.5)
WBC: 12 10*3/uL — ABNORMAL HIGH (ref 4.0–10.5)

## 2014-09-06 LAB — BLOOD GAS, ARTERIAL
ACID-BASE DEFICIT: 3 mmol/L — AB (ref 0.0–2.0)
ACID-BASE DEFICIT: 5 mmol/L — AB (ref 0.0–2.0)
Acid-base deficit: 3.5 mmol/L — ABNORMAL HIGH (ref 0.0–2.0)
BICARBONATE: 20.6 meq/L (ref 20.0–24.0)
Bicarbonate: 21.1 mEq/L (ref 20.0–24.0)
Bicarbonate: 19.4 mEq/L — ABNORMAL LOW (ref 20.0–24.0)
Drawn by: 331471
Drawn by: 103701
Drawn by: 235321
FIO2: 1 %
FIO2: 1 %
FIO2: 0.6 %
O2 SAT: 94.9 %
O2 SAT: 87.3 %
O2 Saturation: 93.7 %
PCO2 ART: 35.5 mmHg (ref 35.0–45.0)
PEEP: 6 cmH2O
PO2 ART: 60.8 mmHg — AB (ref 80.0–100.0)
Patient temperature: 98.6
Patient temperature: 98.5
Pressure control: 8 cmH2O
RATE: 15 resp/min
TCO2: 19.8 mmol/L (ref 0–100)
TCO2: 18.3 mmol/L (ref 0–100)
TCO2: 19.1 mmol/L (ref 0–100)
pCO2 arterial: 36.3 mmHg (ref 35.0–45.0)
pCO2 arterial: 35.7 mmHg (ref 35.0–45.0)
pH, Arterial: 7.383 (ref 7.350–7.450)
pH, Arterial: 7.356 (ref 7.350–7.450)
pH, Arterial: 7.381 (ref 7.350–7.450)
pO2, Arterial: 80.8 mmHg (ref 80.0–100.0)
pO2, Arterial: 74.5 mmHg — ABNORMAL LOW (ref 80.0–100.0)

## 2014-09-06 LAB — URINALYSIS, ROUTINE W REFLEX MICROSCOPIC
Glucose, UA: NEGATIVE mg/dL
Hgb urine dipstick: NEGATIVE
KETONES UR: NEGATIVE mg/dL
NITRITE: NEGATIVE
PH: 5 (ref 5.0–8.0)
Protein, ur: NEGATIVE mg/dL
Specific Gravity, Urine: 1.034 — ABNORMAL HIGH (ref 1.005–1.030)
Urobilinogen, UA: 1 mg/dL (ref 0.0–1.0)

## 2014-09-06 LAB — URINE MICROSCOPIC-ADD ON

## 2014-09-06 LAB — COMPREHENSIVE METABOLIC PANEL
ALBUMIN: 2.5 g/dL — AB (ref 3.5–5.2)
ALT: 12 U/L (ref 0–35)
ANION GAP: 11 (ref 5–15)
AST: 21 U/L (ref 0–37)
Alkaline Phosphatase: 137 U/L — ABNORMAL HIGH (ref 39–117)
BILIRUBIN TOTAL: 0.9 mg/dL (ref 0.3–1.2)
BUN: 22 mg/dL (ref 6–23)
CALCIUM: 8.3 mg/dL — AB (ref 8.4–10.5)
CHLORIDE: 99 meq/L (ref 96–112)
CO2: 22 mmol/L (ref 19–32)
CREATININE: 1.04 mg/dL (ref 0.50–1.10)
GFR calc Af Amer: 66 mL/min — ABNORMAL LOW (ref >90)
GFR calc non Af Amer: 57 mL/min — ABNORMAL LOW (ref >90)
Glucose, Bld: 112 mg/dL — ABNORMAL HIGH (ref 70–99)
Potassium: 3.5 mmol/L (ref 3.5–5.1)
Sodium: 132 mmol/L — ABNORMAL LOW (ref 135–145)
TOTAL PROTEIN: 6.6 g/dL (ref 6.0–8.3)

## 2014-09-06 LAB — MRSA PCR SCREENING: MRSA by PCR: NEGATIVE

## 2014-09-06 LAB — GLUCOSE, CAPILLARY: GLUCOSE-CAPILLARY: 110 mg/dL — AB (ref 70–99)

## 2014-09-06 LAB — I-STAT CG4 LACTIC ACID, ED: LACTIC ACID, VENOUS: 1.23 mmol/L (ref 0.5–2.2)

## 2014-09-06 LAB — I-STAT TROPONIN, ED: TROPONIN I, POC: 0 ng/mL (ref 0.00–0.08)

## 2014-09-06 LAB — PROCALCITONIN: PROCALCITONIN: 0.46 ng/mL

## 2014-09-06 MED ORDER — VANCOMYCIN HCL 10 G IV SOLR
2000.0000 mg | INTRAVENOUS | Status: AC
  Administered 2014-09-06: 2000 mg via INTRAVENOUS
  Filled 2014-09-06: qty 2000

## 2014-09-06 MED ORDER — ALPRAZOLAM 0.5 MG PO TABS
0.5000 mg | ORAL_TABLET | Freq: Three times a day (TID) | ORAL | Status: DC | PRN
  Administered 2014-09-12: 0.5 mg via ORAL
  Filled 2014-09-06 (×2): qty 1

## 2014-09-06 MED ORDER — ALBUTEROL SULFATE (2.5 MG/3ML) 0.083% IN NEBU
5.0000 mg | INHALATION_SOLUTION | Freq: Once | RESPIRATORY_TRACT | Status: AC
  Administered 2014-09-06: 5 mg via RESPIRATORY_TRACT
  Filled 2014-09-06: qty 6

## 2014-09-06 MED ORDER — IOHEXOL 350 MG/ML SOLN
100.0000 mL | Freq: Once | INTRAVENOUS | Status: AC | PRN
  Administered 2014-09-06: 100 mL via INTRAVENOUS

## 2014-09-06 MED ORDER — PANTOPRAZOLE SODIUM 40 MG PO TBEC
40.0000 mg | DELAYED_RELEASE_TABLET | Freq: Every day | ORAL | Status: DC
  Administered 2014-09-07 – 2014-09-21 (×15): 40 mg via ORAL
  Filled 2014-09-06 (×15): qty 1

## 2014-09-06 MED ORDER — BOOST PLUS PO LIQD
237.0000 mL | Freq: Two times a day (BID) | ORAL | Status: DC
  Administered 2014-09-07 – 2014-09-12 (×6): 237 mL via ORAL
  Filled 2014-09-06 (×14): qty 237

## 2014-09-06 MED ORDER — INSULIN ASPART 100 UNIT/ML ~~LOC~~ SOLN
0.0000 [IU] | SUBCUTANEOUS | Status: DC
  Administered 2014-09-07 – 2014-09-10 (×13): 2 [IU] via SUBCUTANEOUS
  Administered 2014-09-10: 3 [IU] via SUBCUTANEOUS
  Administered 2014-09-11 – 2014-09-12 (×7): 2 [IU] via SUBCUTANEOUS
  Administered 2014-09-12: 3 [IU] via SUBCUTANEOUS
  Administered 2014-09-12: 5 [IU] via SUBCUTANEOUS
  Administered 2014-09-12 – 2014-09-13 (×3): 2 [IU] via SUBCUTANEOUS
  Administered 2014-09-13: 3 [IU] via SUBCUTANEOUS
  Administered 2014-09-13 – 2014-09-14 (×4): 2 [IU] via SUBCUTANEOUS
  Administered 2014-09-14 – 2014-09-15 (×3): 3 [IU] via SUBCUTANEOUS
  Administered 2014-09-15: 2 [IU] via SUBCUTANEOUS
  Administered 2014-09-15: 3 [IU] via SUBCUTANEOUS
  Administered 2014-09-15: 5 [IU] via SUBCUTANEOUS
  Administered 2014-09-15 – 2014-09-16 (×3): 2 [IU] via SUBCUTANEOUS
  Administered 2014-09-16: 3 [IU] via SUBCUTANEOUS
  Administered 2014-09-16 – 2014-09-17 (×4): 2 [IU] via SUBCUTANEOUS

## 2014-09-06 MED ORDER — SODIUM CHLORIDE 0.9 % IV BOLUS (SEPSIS)
1000.0000 mL | Freq: Once | INTRAVENOUS | Status: AC
  Administered 2014-09-06: 1000 mL via INTRAVENOUS

## 2014-09-06 MED ORDER — ARIPIPRAZOLE 2 MG PO TABS
2.0000 mg | ORAL_TABLET | Freq: Every day | ORAL | Status: DC
  Administered 2014-09-07 – 2014-09-15 (×8): 2 mg via ORAL
  Filled 2014-09-06 (×17): qty 1

## 2014-09-06 MED ORDER — OXYCODONE HCL ER 15 MG PO T12A
30.0000 mg | EXTENDED_RELEASE_TABLET | Freq: Three times a day (TID) | ORAL | Status: DC
  Administered 2014-09-07 – 2014-09-21 (×41): 30 mg via ORAL
  Filled 2014-09-06 (×42): qty 2

## 2014-09-06 MED ORDER — ACETAMINOPHEN 325 MG PO TABS
650.0000 mg | ORAL_TABLET | Freq: Once | ORAL | Status: AC
  Administered 2014-09-06: 650 mg via ORAL
  Filled 2014-09-06: qty 2

## 2014-09-06 MED ORDER — PIPERACILLIN-TAZOBACTAM 3.375 G IVPB
3.3750 g | Freq: Once | INTRAVENOUS | Status: AC
  Administered 2014-09-06: 3.375 g via INTRAVENOUS
  Filled 2014-09-06: qty 50

## 2014-09-06 MED ORDER — ALBUTEROL SULFATE (2.5 MG/3ML) 0.083% IN NEBU: 2.5000 mg | INHALATION_SOLUTION | RESPIRATORY_TRACT | Status: DC | PRN

## 2014-09-06 MED ORDER — IPRATROPIUM BROMIDE 0.02 % IN SOLN
0.5000 mg | Freq: Once | RESPIRATORY_TRACT | Status: AC
  Administered 2014-09-06: 0.5 mg via RESPIRATORY_TRACT
  Filled 2014-09-06: qty 2.5

## 2014-09-06 MED ORDER — SODIUM CHLORIDE 0.9 % IV SOLN
INTRAVENOUS | Status: DC
  Administered 2014-09-06 – 2014-09-08 (×4): via INTRAVENOUS

## 2014-09-06 MED ORDER — METHYLPREDNISOLONE SODIUM SUCC 125 MG IJ SOLR
80.0000 mg | Freq: Two times a day (BID) | INTRAMUSCULAR | Status: DC
  Administered 2014-09-06: 80 mg via INTRAVENOUS
  Filled 2014-09-06 (×2): qty 2

## 2014-09-06 MED ORDER — FOLIC ACID 1 MG PO TABS
1.0000 mg | ORAL_TABLET | Freq: Every day | ORAL | Status: DC
  Administered 2014-09-07 – 2014-09-21 (×15): 1 mg via ORAL
  Filled 2014-09-06 (×15): qty 1

## 2014-09-06 MED ORDER — SODIUM CHLORIDE 0.9 % IV BOLUS (SEPSIS)
1000.0000 mL | Freq: Once | INTRAVENOUS | Status: DC
Start: 2014-09-06 — End: 2014-09-06

## 2014-09-06 MED ORDER — DEXTROSE 5 % IV SOLN
1.0000 g | Freq: Three times a day (TID) | INTRAVENOUS | Status: DC
  Administered 2014-09-06 – 2014-09-17 (×33): 1 g via INTRAVENOUS
  Filled 2014-09-06 (×34): qty 1

## 2014-09-06 MED ORDER — FLUOXETINE HCL 20 MG PO CAPS
40.0000 mg | ORAL_CAPSULE | Freq: Two times a day (BID) | ORAL | Status: DC
  Administered 2014-09-07 – 2014-09-20 (×27): 40 mg via ORAL
  Administered 2014-09-21: 20 mg via ORAL
  Filled 2014-09-06 (×31): qty 2

## 2014-09-06 MED ORDER — ENOXAPARIN SODIUM 40 MG/0.4ML ~~LOC~~ SOLN
40.0000 mg | SUBCUTANEOUS | Status: DC
  Administered 2014-09-06 – 2014-09-20 (×15): 40 mg via SUBCUTANEOUS
  Filled 2014-09-06 (×16): qty 0.4

## 2014-09-06 MED ORDER — SIMVASTATIN 40 MG PO TABS
40.0000 mg | ORAL_TABLET | Freq: Every day | ORAL | Status: DC
  Administered 2014-09-07 – 2014-09-13 (×7): 40 mg via ORAL
  Filled 2014-09-06 (×7): qty 1

## 2014-09-06 MED ORDER — FERROUS SULFATE 325 (65 FE) MG PO TABS
325.0000 mg | ORAL_TABLET | Freq: Two times a day (BID) | ORAL | Status: DC
  Administered 2014-09-07 – 2014-09-21 (×28): 325 mg via ORAL
  Filled 2014-09-06 (×34): qty 1

## 2014-09-06 MED ORDER — VANCOMYCIN HCL IN DEXTROSE 750-5 MG/150ML-% IV SOLN
750.0000 mg | Freq: Two times a day (BID) | INTRAVENOUS | Status: DC
  Administered 2014-09-07: 750 mg via INTRAVENOUS
  Filled 2014-09-06: qty 150

## 2014-09-06 NOTE — ED Notes (Signed)
Patient transported to CT 

## 2014-09-06 NOTE — ED Notes (Signed)
MD Plunkett notified regarding hypotensive BP 84/49 MAP 57.

## 2014-09-06 NOTE — Consult Note (Signed)
Name: Emily West [facility] MRN: [id number] DOB: Jan 16, 1951    ADMISSION DATE:  09/06/2014 CONSULTATION DATE:  09/06/2014  REFERRING MD :  Karleen Hampshire  CHIEF COMPLAINT:  SOB  BRIEF PATIENT DESCRIPTION: 63 year old female with recent dx stage IV NSCLC admitted 12/23 for hypoxic respiratory failure 2/2 presumed CAP. Despite high flow O2 she remained hypoxic. PCCM asked to consult.   SIGNIFICANT EVENTS    STUDIES:  Echo 12/1 > LVEF 55-60% CT chest 12/23 > bilateral GGO with bilateral pleural effusions. Pulm edema vs hypersensitivity pneumonitis. Pathologic T10 vertebral body fx.    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 She is followed by Dr Earlie Server.  Other PMH as below, which includes HTN and HLD. She is on 3L O2 at home since late November when her cancer was found. She has not had any issues with that until 12/23 when she became acutely short of breath. She also had fevers and lethargy. In ED she was found to be profoundly hypoxic. She was placed on NRB and admitted to [facility] for presumed CAP. Despite high flow O2 she remained hypoxic with PaO2 in the 60s . PCCM was contacted to consult.   PAST MEDICAL HISTORY :   has a past medical history of History of suicidal ideation; HTN (hypertension); HLD (hyperlipidemia); Shortness of breath; Anxiety and depression; Menopause; Benign hematuria; Impaired fasting glucose; Other abnormal glucose; and Cancer.  has past surgical history that includes Colonoscopy (11/21/2003); Cystoscopy; and Lymph node biopsy (Right, 08/09/2014). Prior to Admission medications   Medication Sig Start Date End Date Taking? Authorizing Provider  ALPRAZolam Duanne Moron) 0.5 MG tablet Take 1 tablet (0.5 mg total) by mouth 3 (three) times daily as needed for anxiety. 09/04/14  Yes Curt Bears, MD  ARIPiprazole (ABILIFY) 2 MG tablet Take 2 mg by mouth daily.   Yes  Historical Provider, MD  cyclobenzaprine (FLEXERIL) 10 MG tablet Take 1 tablet (10 mg total) by mouth at bedtime. 08/22/14  Yes Debbe Odea, MD  ENSURE PLUS (ENSURE PLUS) LIQD Take 237 mLs by mouth 2 (two) times daily.   Yes Historical Provider, MD  esomeprazole (NEXIUM) 20 MG capsule Take 20 mg by mouth every morning.   Yes Historical Provider, MD  ferrous sulfate 325 (65 FE) MG tablet Take 1 tablet (325 mg total) by mouth 2 (two) times daily with a meal. 08/22/14  Yes Debbe Odea, MD  FLUoxetine (PROZAC) 40 MG capsule Take 1 capsule (40 mg total) by mouth 2 (two) times daily. 08/22/14  Yes Debbe Odea, MD  irbesartan (AVAPRO) 75 MG tablet Take 1 tablet (75 mg total) by mouth daily. 08/22/14  Yes Debbe Odea, MD  OxyCODONE 30 MG T12A Take 30 mg by mouth every 8 (eight) hours. 08/22/14  Yes Debbe Odea, MD  Oxycodone HCl 10 MG TABS Take 1 tablet (10 mg total) by mouth every 4 (four) hours as needed. 08/28/14  Yes Curt Bears, MD  simvastatin (ZOCOR) 40 MG tablet Take 40 mg by mouth at bedtime.    Yes Historical Provider, MD  tiotropium (SPIRIVA) 18 MCG inhalation capsule Place 18 mcg into inhaler and inhale daily.   Yes Historical Provider, MD  zolpidem (AMBIEN) 10 MG tablet Take 0.5-1 tablets (5-10 mg total) by mouth at bedtime as needed for sleep. 08/22/14  Yes Debbe Odea, MD  dexamethasone (DECADRON) 4 MG tablet 4 mg by mouth twice a  day the day before, day of and day after the chemotherapy every 3 weeks. Patient not taking: Reported on 09/06/2014 09/01/14   Curt Bears, MD  docusate sodium 100 MG CAPS Take 200 mg by mouth 2 (two) times daily. Patient not taking: Reported on 09/06/2014 08/22/14   Debbe Odea, MD  folic acid (FOLVITE) 1 MG tablet Take 1 tablet (1 mg total) by mouth daily. Patient not taking: Reported on 09/06/2014 09/01/14   Curt Bears, MD  ondansetron (ZOFRAN-ODT) 4 MG disintegrating tablet Take 1 tablet (4 mg total) by mouth every 8 (eight) hours as needed for  nausea or vomiting. Patient not taking: Reported on 09/06/2014 08/22/14   Debbe Odea, MD  polyethylene glycol (MIRALAX / GLYCOLAX) packet Take 17 g by mouth daily as needed for mild constipation (give in AM PRN). Patient not taking: Reported on 09/06/2014 08/22/14   Debbe Odea, MD  prochlorperazine (COMPAZINE) 10 MG tablet Take 1 tablet (10 mg total) by mouth every 6 (six) hours as needed for nausea or vomiting. Patient not taking: Reported on 09/06/2014 09/01/14   Curt Bears, MD  senna (SENOKOT) 8.6 MG TABS tablet Take 1 tablet (8.6 mg total) by mouth at bedtime as needed for mild constipation. Patient not taking: Reported on 09/06/2014 08/22/14   Debbe Odea, MD   No Known Allergies  FAMILY HISTORY:  family history includes Breast cancer in her mother; CVA in her mother; CVA (age of onset: 58) in her brother; Colon cancer in her maternal grandmother; Depression in her mother; Emphysema in her father; Glaucoma in her mother; Goiter in her sister; Thyroid disease in her mother and sister. SOCIAL HISTORY:  reports that she quit smoking about 4 weeks ago. She started smoking about 37 years ago. She does not have any smokeless tobacco history on file. She reports that she does not drink alcohol or use illicit drugs.  REVIEW OF SYSTEMS:   Bolds are positive  Constitutional: weight loss, gain, night sweats, Fevers, chills, fatigue .  HEENT: headaches, Sore throat, sneezing, nasal congestion, post nasal drip, Difficulty swallowing, Tooth/dental problems, visual complaints visual changes, ear ache CV:  chest pain, radiates: ,Orthopnea, PND, swelling in lower extremities, dizziness, palpitations, syncope.  GI  heartburn, indigestion, abdominal pain, nausea, vomiting, diarrhea, change in bowel habits, loss of appetite, bloody stools.  Resp: cough, productive: , hemoptysis, dyspnea, chest pain, pleuritic.  Skin: rash or itching or icterus GU: dysuria, change in color of urine, urgency or  frequency. flank pain, hematuria  MS: joint pain or swelling. decreased range of motion  Psych: change in mood or affect. depression or anxiety.  Neuro: difficulty with speech, weakness, numbness, ataxia    SUBJECTIVE:   VITAL SIGNS: Temp:  [98.9 F (37.2 C)-102 F (38.9 C)] 98.9 F (37.2 C) (12/23 1515) Pulse Rate:  [80-99] 88 (12/23 1750) Resp:  [14-54] 54 (12/23 1750) BP: (81-112)/(35-49) 106/43 mmHg (12/23 1750) SpO2:  [71 %-99 %] 84 % (12/23 1750) Weight:  [99.791 kg (220 lb)] 99.791 kg (220 lb) (12/23 1340)  PHYSICAL EXAMINATION: General:  Obese female comfortable on BiPAP Neuro:  Alert, oriented, non-focal HEENT:  [place]/AT, PERRL, No JVD ntoed Cardiovascular:  RRR, no MRG Lungs:  Tachypnea on BiPAP. Bilateral fine crackles, diminished bases Abdomen:  Obese, soft, non-tender, non-distended Musculoskeletal:  No acute deformity or ROM limitation. Trace BLE edema Skin:  Grossly intact   Recent Labs Lab 09/01/14 0835 09/06/14 1208  NA 138 132*  K 4.8 3.5  CL  --  99  CO2 25 22  BUN 13.5 22  CREATININE 0.8 1.04  GLUCOSE 120 112*    Recent Labs Lab 09/01/14 0835 09/06/14 1208  HGB 10.6* 9.9*  HCT 34.0* 31.4*  WBC 8.6 12.0*  PLT 299 289   Dg Chest 1 View  09/06/2014   CLINICAL DATA:  Shortness of breath, history of carcinoma of the lung  EXAM: CHEST - 1 VIEW  COMPARISON:  08/15/2014  FINDINGS: Cardiac shadow is stable. Increased vascular congestion is well as diffuse interstitial infiltrates are identified bilaterally. These have increased significantly from a prior CT examination from 08/08/2014 as well as a recent chest x-ray. Some of this may be related to the patient's underlying history of carcinoma of the lung. Fullness in the mediastinum is again noted consistent with the patient's known lymphadenopathy.  IMPRESSION: Significant increase in bilateral infiltrates a portion which may be related to the patient's underlying neoplastic history.   Electronically  Signed   By: [name] M.D.   On: 09/06/2014 13:25   Dg Hip Complete Right  09/06/2014   CLINICAL DATA:  Fall from car yesterday with right-sided hip pain, initial encounter  EXAM: RIGHT HIP - COMPLETE 2+ VIEW  COMPARISON:  None.  FINDINGS: The pelvic ring is intact. No acute fracture or dislocation is noted. No gross soft tissue abnormality is seen. Postsurgical changes are noted in the pelvis.  IMPRESSION: No acute abnormality noted.   Electronically Signed   By: [name] M.D.   On: 09/06/2014 13:22   Ct Angio Chest Pe W/cm &/or Wo Cm  09/06/2014   CLINICAL DATA:  Shortness of breath. Recent diagnosis of lung cancer.  EXAM: CT ANGIOGRAPHY CHEST WITH CONTRAST  TECHNIQUE: Multidetector CT imaging of the chest was performed using the standard protocol during bolus administration of intravenous contrast. Multiplanar CT image reconstructions and MIPs were obtained to evaluate the vascular anatomy.  CONTRAST:  147mL OMNIPAQUE IOHEXOL 350 MG/ML SOLN  COMPARISON:  CT chest 08/08/2014  FINDINGS: There is adequate opacification of the pulmonary arteries. There is no pulmonary embolus. The main pulmonary artery, right main pulmonary artery and left main pulmonary arteries are normal in size. The heart size is normal. There is no pericardial effusion. There is mild left main coronary artery atherosclerosis.  Bilateral diffuse patchy areas of airspace disease with associated ground-glass opacities. Moderate right pleural effusion. Small left pleural effusion. Bibasilar atelectasis. No pneumothorax.  Right supraclavicular lymph node measuring 12 mm in short axis. Right upper paratracheal lymph node measuring 26 mm in short axis. Right axillary lymph node measuring 20 mm in short partial necrotic subcarinal lymph node axis. Measuring 28 mm in short axis.  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  There is a severe T10 vertebral body compression fracture with approximately 75% height loss  concerning for pathologic fracture.  Moderate-sized hiatal hernia.  Review of the MIP images confirms the above findings.  IMPRESSION: 1. Interval development of bilateral diffuse patchy airspace disease with ground-glass opacities and bilateral pleural effusions. Findings most concerning for an infectious or inflammatory process including hypersensitivity pneumonitis versus pulmonary edema. 2. Findings concerning for a severe pathologic T10 vertebral body compression fracture. 3. Mediastinal and right hilar lymphadenopathy persists.   Electronically Signed   By: Kathreen Devoid   On: 09/06/2014 15:09    ASSESSMENT / PLAN:  PULMONARY A: Acute hypoxemic respiratory failure. ddx includes pulmonary edema, HCAP Bilateral pleural effusions Stage IV NSCLC  P:   Continuous BiPAP overnight (4hrs on  1off if able) Titrate FiO2 to maintatin SpO2 > 92-97% May require intubation, fear she may tire if tachypnea does not improve PRN albuterol Trend CXR, ABG Check BNP May benefit from diuresis once BP can tolerate  CARDIOVASCULAR A:  Hypotension (improved) - dehydration vs severe sepsis H/o HTN  P:  Telemetry Gentle IVF hydration Holding home anti-hypertensive's for now (irbesartan) Continue statin  RENAL A:   No acute issues  P:   Follow Bmet I&O  GASTROINTESTINAL A:   No acute issue  P:   NPO while on BiPAP Continue home PPI RN to hold PO meds tonight  HEMATOLOGIC A:   Anemia, chronic  P:  Follow CBC  INFECTIOUS A:   Severe sepsis 2/2 HCAP  P:   BCx2 12/23 >>> UC 12/23 >>> Sputum 12/23 >>> Abx: cefepime, start date 12/23>>> Abx: vancomycin, start date 12/23>>> Trend WBC and fever curve  ENDOCRINE A:   No acute issues    P:   Follow glucose on chemistries  NEUROLOGIC A:   Anxiety Depression  P:   RASS goal: 0 Hold sedating meds for tonight (xanax, fluoxetine, oxycodone)   FAMILY  - Updates:   - Inter-disciplinary family meet or Palliative Care  meeting due by:  11/30   PCCM ATTENDING: Pt seen on work rounds with care provider noted above. We reviewed pt's initial presentation, consultants notes and hospital database in detail.  The above assessment and plan was formulated under my direction.  In summary: Recent dx of metastatic NSCCa.  S/P one XRT treatment Acute hypoxic resp faillure Extensive bilateral lung infiltrates - PNA and/or pneumonitis  Cont PRN NPPV Cont BDs Cont abx as above Empiric steroids   Merton Border, MD;  PCCM service; Mobile [phone]

## 2014-09-06 NOTE — ED Notes (Addendum)
Per EMS- c/o SOB, diagnosed with lung CA around Thanksgiving 2015. Felt SOB worsened this morning- feels like "she can't catch her breath" at baseline. Original O2 saturations in the 70s on 3L [place] O2 (home O2). Placed on non re-breather. Denies pain. Reports dry cough. A&Ox4.  Has T10 & L3 fractures. Has brace in place. VS:  BP 95/47 HR 103 RR 24 SpO2 95% on non-re-breather.  Addendum-patient also reports hearing "right hip pop" last week. Was recently hospitalized. MD at bedside.

## 2014-09-06 NOTE — Progress Notes (Signed)
eLink Physician-Brief Progress Note Patient Name: Emily West [facility] DOB: 1951/08/25 MRN: [id number]   Date of Service  09/06/2014  HPI/Events of Note  Pt with recent dx of stage IV NSCLC s/p XRT to spine.  Presented with fever, dyspnea, hypoxia, and pulmonary infiltrates.  Abx for HCAP started by primary team.   eICU Interventions  Will order BiPAP.  PCCM to consult.        [name] 09/06/2014, 5:49 PM

## 2014-09-06 NOTE — ED Notes (Signed)
Patient transported to X-ray 

## 2014-09-06 NOTE — ED Notes (Signed)
Bed: WA21 Expected date:  Expected time:  Means of arrival:  Comments: Ems- 63 yo F, lung cancer, SOB

## 2014-09-06 NOTE — Progress Notes (Signed)
ED CM noted Cm consult for home health Cm spoke with [facility] of [place] Pending further call from [employer]  Pt active with DME 3-N-1,HOSPITAL BED, OXYGEN, SHOWER STOOL, [place]  [place]HH-1 RNHH-2 PTHH-10 DISEASE MANAGEMENT

## 2014-09-06 NOTE — ED Notes (Signed)
Pt is currently not receiving chemo, scheduled to start chemo on 09/13/14.

## 2014-09-06 NOTE — Telephone Encounter (Signed)
Faxed note to [facility]

## 2014-09-06 NOTE — H&P (Addendum)
[facility] History and Physical  Emily West IRJ:[id number] DOB: 1951-06-03 DOA: 09/06/2014  Referring physician: EDP DR plunkett PCP: [name], MD   Chief Complaint: sob since one day.   HPI: Emily West is a 63 y.o. 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 On arrival to ED, she required NRB to keep sats >90%. Initial imaging with a CT angio ruled out pulmonary embolism, but she was found o have ground glass opacities, consistent with infectious/ inflammatory etiology. She was referred to medical service for admission for management of hcap. She was found to be febrile on arrival. Labs revealed mild leukocytosis, anemia and mild hyponatremia. She was initially admitted ot stepdown, and on arrival to the unit, her sats dropped to low 80's and PCCM consulted. She was put on BIPAP and Dr Halford Chessman has agreed to take over the care of the patient.   Review of Systems:  Detailed ROS could not be in view of her being on NRB.  Past Medical History  Diagnosis Date  . History of suicidal ideation   . HTN (hypertension)   . HLD (hyperlipidemia)   . Shortness of breath   . Anxiety and depression   . Menopause   . Benign hematuria   . Impaired fasting glucose   . Other abnormal glucose   . Cancer    Past Surgical History  Procedure Laterality Date  . Colonoscopy  11/21/2003     rare early left-sided diverticula remaining, low sigmoid anastomosis, internal and external hemorrhoids,   . Cystoscopy    . Lymph node biopsy Right 08/09/2014    Procedure: Right axillary lymph node biopsy;  Surgeon: Alphonsa Overall, MD;  Location: [facility];  Service: General;  Laterality: Right;   Social History:  reports that she quit smoking about 4 weeks ago. She started smoking about 37 years ago. She does not have any smokeless tobacco history on  file. She reports that she does not drink alcohol or use illicit drugs.  No Known Allergies  Family History  Problem Relation Age of Onset  . Emphysema Father   . Depression Mother   . Glaucoma Mother   . Thyroid disease Mother   . CVA Mother   . Breast cancer Mother   . Colon cancer Maternal Grandmother   . CVA Brother 104    1/3  . Thyroid disease Sister     1/2, partial thyroidectomy  . Goiter Sister     2/2     Prior to Admission medications   Medication Sig Start Date End Date Taking? Authorizing Provider  ALPRAZolam Duanne Moron) 0.5 MG tablet Take 1 tablet (0.5 mg total) by mouth 3 (three) times daily as needed for anxiety. 09/04/14  Yes Curt Bears, MD  ARIPiprazole (ABILIFY) 2 MG tablet Take 2 mg by mouth daily.   Yes Historical Provider, MD  cyclobenzaprine (FLEXERIL) 10 MG tablet Take 1 tablet (10 mg total) by mouth at bedtime. 08/22/14  Yes Debbe Odea, MD  ENSURE PLUS (ENSURE PLUS) LIQD Take 237 mLs by mouth 2 (two) times daily.   Yes Historical Provider, MD  esomeprazole (NEXIUM) 20 MG capsule Take 20 mg by mouth every morning.   Yes Historical Provider, MD  ferrous sulfate 325 (65 FE) MG tablet Take 1 tablet (325 mg total) by mouth 2 (two) times daily with a meal. 08/22/14  Yes Debbe Odea, MD  FLUoxetine (PROZAC) 40 MG capsule Take 1 capsule (40 mg total) by mouth 2 (two) times daily. 08/22/14  Yes Debbe Odea, MD  irbesartan (AVAPRO) 75 MG tablet Take 1 tablet (75 mg total) by mouth daily. 08/22/14  Yes Debbe Odea, MD  OxyCODONE 30 MG T12A Take 30 mg by mouth every 8 (eight) hours. 08/22/14  Yes Debbe Odea, MD  Oxycodone HCl 10 MG TABS Take 1 tablet (10 mg total) by mouth every 4 (four) hours as needed. 08/28/14  Yes Curt Bears, MD  simvastatin (ZOCOR) 40 MG tablet Take 40 mg by mouth at bedtime.    Yes Historical Provider, MD  tiotropium (SPIRIVA) 18 MCG inhalation capsule Place 18 mcg into inhaler and inhale daily.   Yes Historical Provider, MD  zolpidem  (AMBIEN) 10 MG tablet Take 0.5-1 tablets (5-10 mg total) by mouth at bedtime as needed for sleep. 08/22/14  Yes Debbe Odea, MD  dexamethasone (DECADRON) 4 MG tablet 4 mg by mouth twice a day the day before, day of and day after the chemotherapy every 3 weeks. Patient not taking: Reported on 09/06/2014 09/01/14   Curt Bears, MD  docusate sodium 100 MG CAPS Take 200 mg by mouth 2 (two) times daily. Patient not taking: Reported on 09/06/2014 08/22/14   Debbe Odea, MD  folic acid (FOLVITE) 1 MG tablet Take 1 tablet (1 mg total) by mouth daily. Patient not taking: Reported on 09/06/2014 09/01/14   Curt Bears, MD  ondansetron (ZOFRAN-ODT) 4 MG disintegrating tablet Take 1 tablet (4 mg total) by mouth every 8 (eight) hours as needed for nausea or vomiting. Patient not taking: Reported on 09/06/2014 08/22/14   Debbe Odea, MD  polyethylene glycol (MIRALAX / GLYCOLAX) packet Take 17 g by mouth daily as needed for mild constipation (give in AM PRN). Patient not taking: Reported on 09/06/2014 08/22/14   Debbe Odea, MD  prochlorperazine (COMPAZINE) 10 MG tablet Take 1 tablet (10 mg total) by mouth every 6 (six) hours as needed for nausea or vomiting. Patient not taking: Reported on 09/06/2014 09/01/14   Curt Bears, MD  senna (SENOKOT) 8.6 MG TABS tablet Take 1 tablet (8.6 mg total) by mouth at bedtime as needed for mild constipation. Patient not taking: Reported on 09/06/2014 08/22/14   Debbe Odea, MD   Physical Exam: Filed Vitals:   09/06/14 1700 09/06/14 1715 09/06/14 1750 09/06/14 1800  BP: 88/35  106/43 99/36  Pulse:  82 88 84  Temp:      TempSrc:      Resp: 33 37 54 16  Height:      Weight:      SpO2:  93% 84% 88%    Wt Readings from Last 3 Encounters:  09/06/14 99.791 kg (220 lb)  08/09/14 100.9 kg (222 lb 7.1 oz)  08/16/14 100.699 kg (222 lb)    General:  Appears in mild to moderate distress.  Eyes: PERRL, normal lids, irises & conjunctiva Cardiovascular: RRR, no  m/r/g. No LE edema. Telemetry: SR, no arrhythmias  Respiratory: decreased air entry at bases. No wheezing. Scattered rhonchi.  Abdomen: soft, ntnd Skin: no rash or induration seen on limited exam Musculoskeletal: grossly normal tone BUE/BLE Neurologic: speech normal, no facial droop. Able to move all extremities. Gait could not be examined          Labs on Admission:  Basic Metabolic Panel:  Recent Labs Lab 09/01/14 0835 09/06/14 1208  NA 138 132*  K 4.8 3.5  CL  --  99  CO2 25 22  GLUCOSE 120 112*  BUN 13.5 22  CREATININE 0.8 1.04  CALCIUM 8.9 8.3*   Liver Function Tests:  Recent Labs Lab 09/01/14 0835 09/06/14 1208  AST 17 21  ALT 20 12  ALKPHOS 172* 137*  BILITOT 0.60 0.9  PROT 6.5 6.6  ALBUMIN 2.5* 2.5*   No results for input(s): LIPASE, AMYLASE in the last 168 hours. No results for input(s): AMMONIA in the last 168 hours. CBC:  Recent Labs Lab 09/01/14 0835 09/06/14 1208  WBC 8.6 12.0*  NEUTROABS 6.7* 9.8*  HGB 10.6* 9.9*  HCT 34.0* 31.4*  MCV 89.0 88.5  PLT 299 289   Cardiac Enzymes: No results for input(s): CKTOTAL, CKMB, CKMBINDEX, TROPONINI in the last 168 hours.  BNP (last 3 results)  Recent Labs  08/15/14 0425  PROBNP 114.8   CBG: No results for input(s): GLUCAP in the last 168 hours.  Radiological Exams on Admission: Dg Chest 1 View  09/06/2014   CLINICAL DATA:  Shortness of breath, history of carcinoma of the lung  EXAM: CHEST - 1 VIEW  COMPARISON:  08/15/2014  FINDINGS: Cardiac shadow is stable. Increased vascular congestion is well as diffuse interstitial infiltrates are identified bilaterally. These have increased significantly from a prior CT examination from 08/08/2014 as well as a recent chest x-ray. Some of this may be related to the patient's underlying history of carcinoma of the lung. Fullness in the mediastinum is again noted consistent with the patient's known lymphadenopathy.  IMPRESSION: Significant increase in bilateral  infiltrates a portion which may be related to the patient's underlying neoplastic history.   Electronically Signed   By: [name] M.D.   On: 09/06/2014 13:25   Dg Hip Complete Right  09/06/2014   CLINICAL DATA:  Fall from car yesterday with right-sided hip pain, initial encounter  EXAM: RIGHT HIP - COMPLETE 2+ VIEW  COMPARISON:  None.  FINDINGS: The pelvic ring is intact. No acute fracture or dislocation is noted. No gross soft tissue abnormality is seen. Postsurgical changes are noted in the pelvis.  IMPRESSION: No acute abnormality noted.   Electronically Signed   By: [name] M.D.   On: 09/06/2014 13:22   Ct Angio Chest Pe W/cm &/or Wo Cm  09/06/2014   CLINICAL DATA:  Shortness of breath. Recent diagnosis of lung cancer.  EXAM: CT ANGIOGRAPHY CHEST WITH CONTRAST  TECHNIQUE: Multidetector CT imaging of the chest was performed using the standard protocol during bolus administration of intravenous contrast. Multiplanar CT image reconstructions and MIPs were obtained to evaluate the vascular anatomy.  CONTRAST:  153mL OMNIPAQUE IOHEXOL 350 MG/ML SOLN  COMPARISON:  CT chest 08/08/2014  FINDINGS: There is adequate opacification of the pulmonary arteries. There is no pulmonary embolus. The main pulmonary artery, right main pulmonary artery and left main pulmonary arteries are normal in size. The heart size is normal. There is no pericardial effusion. There is mild left main coronary artery atherosclerosis.  Bilateral diffuse patchy areas of airspace disease with associated ground-glass opacities. Moderate right pleural effusion. Small left pleural effusion. Bibasilar atelectasis. No pneumothorax.  Right supraclavicular lymph node measuring 12 mm in short axis. Right upper paratracheal lymph node measuring 26 mm in short axis. Right axillary lymph node measuring 20 mm in short partial necrotic subcarinal lymph node axis. Measuring 28 mm in short axis.  Prior left mastectomy with a left breast implant  present.  There is a severe T10 vertebral body compression fracture with approximately 75%  height loss concerning for pathologic fracture.  Moderate-sized hiatal hernia.  Review of the MIP images confirms the above findings.  IMPRESSION: 1. Interval development of bilateral diffuse patchy airspace disease with ground-glass opacities and bilateral pleural effusions. Findings most concerning for an infectious or inflammatory process including hypersensitivity pneumonitis versus pulmonary edema. 2. Findings concerning for a severe pathologic T10 vertebral body compression fracture. 3. Mediastinal and right hilar lymphadenopathy persists.   Electronically Signed   By: Kathreen Devoid   On: 09/06/2014 15:09    IZT:IWPYK rhythm  Assessment/Plan Active Problems:   HCAP (healthcare-associated pneumonia)  Acute hypoxic respiratory failure secondary to worsening of lung cancer and health care associated pneumonia: - ICU, on BIPAP tonight.  - IV antibiotics with vancomycin and cefepime.  - ABG - blood cultures done and results pending.    Hypotension/ sepsis: Possibly from HCAP.  Hydration to keep map>55   NSCLC with bone and brain mets: Dr [name] will be notified in am.  Chemo will be held for now.   Leukocytosis: Possibly from HCAP.   Mild hyponatremia: Repeat labs in am  Further management as per PCCM.    Code Status: full code.  DVT Prophylaxis: Family Communication: none atbedside Disposition Plan: admit to ICU  Time spent: 55 min  [facility] Pager [phone]

## 2014-09-06 NOTE — ED Notes (Signed)
MD Plunkett notified regarding MAPs in high 40s and low 50s.

## 2014-09-06 NOTE — Telephone Encounter (Signed)
Per OT pt sent to ED via EMS for low oxygen sat, tachypnea, low grade fever.

## 2014-09-06 NOTE — Progress Notes (Signed)
ANTIBIOTIC CONSULT NOTE - INITIAL  Pharmacy Consult for Vancomycin Indication: rule out pneumonia  No Known Allergies  Patient Measurements: Height: 5\' 5"  (165.1 cm) Weight: 220 lb (99.791 kg) IBW/kg (Calculated) : 57  Vital Signs: Temp: 102 F (38.9 C) (12/23 1149) Temp Source: Rectal (12/23 1149) BP: 93/41 mmHg (12/23 1330) Pulse Rate: 92 (12/23 1340) Intake/Output from previous day:   Intake/Output from this shift: Total I/O In: 50 [P.O.:50] Out: 300 [Urine:300]  Labs:  Recent Labs  09/06/14 1208  WBC 12.0*  HGB 9.9*  PLT 289  CREATININE 1.04   Estimated Creatinine Clearance: 67.3 mL/min (by C-G formula based on Cr of 1.04). No results for input(s): VANCOTROUGH, VANCOPEAK, VANCORANDOM, GENTTROUGH, GENTPEAK, GENTRANDOM, TOBRATROUGH, TOBRAPEAK, TOBRARND, AMIKACINPEAK, AMIKACINTROU, AMIKACIN in the last 72 hours.   Microbiology: Recent Results (from the past 720 hour(s))  Surgical pcr screen     Status: None   Collection Time: 08/09/14  7:29 AM  Result Value Ref Range Status   MRSA, PCR NEGATIVE NEGATIVE Final   Staphylococcus aureus NEGATIVE NEGATIVE Final    Comment:        The Xpert SA Assay (FDA approved for NASAL specimens in patients over 67 years of age), is one component of a comprehensive surveillance program.  Test performance has been validated by [employer] for patients greater than or equal to 77 year old. It is not intended to diagnose infection nor to guide or monitor treatment.     Medical History: Past Medical History  Diagnosis Date  . History of suicidal ideation   . HTN (hypertension)   . HLD (hyperlipidemia)   . Shortness of breath   . Anxiety and depression   . Menopause   . Benign hematuria   . Impaired fasting glucose   . Other abnormal glucose     Medications:  Scheduled:  . albuterol  5 mg Nebulization Once  . ipratropium  0.5 mg Nebulization Once   Infusions:  . piperacillin-tazobactam (ZOSYN)  IV    .  sodium chloride 1,000 mL (09/06/14 1334)  . vancomycin     PRN:   Assessment: 63 yo female presents with abrupt onset of SHOB. She was recently diagnosed with metastatic stage IV lung cancer during previous admission 08/08/14-08/22/14. Plan is to begin chemotherapy on 09/13/14.She also notes pain in her right hip after felt something pop after getting out of the car yesterday. CXR shows significant increase in bilateral infiltrates, a portion which may be related to the patient's underlying neoplastic history. Pharmacy is consulted to dose vancomycin for possible pneumonia.  12/23 >> vancomycin >> 12/23 >> zosyn x 1  Tmax: 102 WBC: 12k Renal: SCr 1.04 (increased from previous admit), CrCl 64N  No cultures ordered yet   Goal of Therapy:  Vancomycin trough level 15-20 mcg/ml  Plan:   Vancomycin 2g IV x 1 dose now, then 750mg  IV q12h Check trough at steady state Follow up renal function & cultures, cultures (if ordered) Continue zosyn or cefepime?  Peggyann Juba, PharmD, BCPS Pager: [phone] 09/06/2014,1:49 PM

## 2014-09-06 NOTE — Progress Notes (Signed)
Neb tx completed with 6LPM [place] O2 and 8LPM mask O2.

## 2014-09-06 NOTE — Progress Notes (Signed)
[place] for Cefepime Indication:  HCAP   Patient known to pharmacy from earlier dosing of Vancomycin   Patient on Vancomycin 750mg  IV q12h  Received Zosyn 3.375gm IV  X 1 in ED @ 14:00  Blood and sputum cultures ordered CrCl ~ 67 ml/min  Plan: Cefepime 1gm IV q8h           Follow renal function and cultures/sensitivities  Leone Haven, PharmD 09/06/14 @ 16:58

## 2014-09-06 NOTE — Telephone Encounter (Signed)
-----   Message from Curt Bears, MD sent at 08/27/2014 12:05 PM EST ----- I have nothing to approve. It is ordered by a physician and should be approved by that physician. I am OK with it. ----- Message -----    From: Ardeen Garland, RN    Sent: 08/25/2014   9:40 AM      To: Genia Plants, RN, Curt Bears, MD  D/C from hospital . Dx with lung and pathological fracture T 10, L 3.   Hospitalist Rizwan ordered:  1.PT for gait training 2.Educate pt on  spinal precautions, 3.Provide  general strength and home exercises, mobility assistance, 4.teach pursed lip breathing 5.pulse ox to check O2 sat  6. OT evaluation.  X 9 weeks  Will you approve ?

## 2014-09-06 NOTE — Progress Notes (Signed)
  CARE MANAGEMENT ED NOTE 09/06/2014  Patient:  Emily West,Emily West   Account Number:  [id number]  Date Initiated:  09/06/2014  Documentation initiated by:  Jackelyn Poling  Subjective/Objective Assessment:   63 yr old [name] pt c/o sob, pain rt hip after fall getting out of car 09/05/14  recent dx mets stage 4 lung cancer during last admission plan chemo start 09/13/14     Subjective/Objective Assessment Detail:   Recent hospitalization 08/08/14 to 08/22/14 c/o 2 week back pain dx non small cell lung cancer non traumatic compression fx of vertebral column, spinal mets lymph nodes hx left breast cancer chronic resp failure seen by palliative pain mgmt, back brace has acute encephalopathy seen by psych prozac & abilify  increased D/c in stable condition home with [facility] for home health services for [facility]- disease management & HHPT; [facility]ME from [facility] for 3n1, hospital bed, O2-3 L at home, shower stool, walker- rolling  Pt states she sees Dr Emily West oncologist for "primary care doctor"  seen on 09/01/14 in office: pcp in [place] listed as Emily West Husband supportive  Imaging= Significant increase in bilateral infiltrates a portion which may be related to the patient's underlying neoplastic history. Interval development of bilateral diffuse patchy airspace disease  with ground-glass opacities and bilateral pleural effusions.  Findings most concerning for an infectious or inflammatory process  including hypersensitivity pneumonitis versus pulmonary edema.  2. Findings concerning for a severe pathologic T10 vertebral body  compression fracture.  3. Mediastinal and right hilar lymphadenopathy persists.  In ED placed on O2 6-15 L-non rebreather, neb tx with 6 L [place] for sat 71% bp 81/36- NS bolus x 2  zoysyn iv vanc iv   temp 102. rectally oral tylenol resp 32  dx HCAP, bilat effusions  Choice offered to pt Her choice of home health services is [facility] per pt      Action/Plan:   Cm reviewed EPIC notes and spoke with pt, Stanton Kidney of [place] and left voice message about pt re admission   Action/Plan Detail:   Anticipated DC Date:  09/09/2014     Status Recommendation to Physician:   Result of Recommendation:    Other ED [place]  Other  Outpatient Services - Pt will follow up   [place]   Choice offered to / List presented to:  C-1 Patient  DME arranged  3-N-1  [place]     DME agency  [place] arranged  HH-10 DISEASE MANAGEMENT  HH-2 PT  HH-1 RN      [place]    Status of service:  Completed, signed off  ED Comments:   ED Comments Detail:

## 2014-09-06 NOTE — Progress Notes (Signed)
Upon arrival, patient O2 sats in low 80s on NRB and respiratory rate of 43-44. MD Akula text-paged and respiratory called.

## 2014-09-06 NOTE — Progress Notes (Signed)
RT called to patient room for re-application of NIV per MD request. Upon arrival to room, MD and NP no longer present. Patient restored to NIV per RN and NP. Settings noted to have changed. Findings documented on appropriate flowsheet. RT will continue to follow.

## 2014-09-06 NOTE — ED Provider Notes (Addendum)
CSN: [id number]     Arrival date & time 09/06/14  1133 History   First MD Initiated Contact with Patient 09/06/14 1138     Chief Complaint  Patient presents with  . Shortness of Breath  . Lung Cancer     (Consider location/radiation/quality/duration/timing/severity/associated sxs/prior Treatment) Patient is a 63 y.o. female presenting with shortness of breath. The history is provided by the patient.  Shortness of Breath Severity:  Severe Onset quality:  Sudden Duration:  1 day Timing:  Constant Progression:  Unchanged Chronicity:  New Context comment:  Pt has been on 3L of O2 with mild SOB since thanksgiving when dx with metastatic lung cancer but became much worse today Relieved by:  Nothing Worsened by:  Nothing tried Ineffective treatments:  Rest, sitting up and oxygen Associated symptoms: no abdominal pain, no chest pain, no cough, no fever, no headaches, no sputum production, no vomiting and no wheezing   Associated symptoms comment:  Pt was getting out of the car yesterday and felt something pop in her right leg and has had pain in the right hip since.  No leg swelling or color change Risk factors: hx of cancer   Risk factors: no hx of PE/DVT and no recent surgery   Risk factors comment:  Recently quit smoking   Past Medical History  Diagnosis Date  . History of suicidal ideation   . HTN (hypertension)   . HLD (hyperlipidemia)   . Shortness of breath   . Anxiety and depression   . Menopause   . Benign hematuria   . Impaired fasting glucose   . Other abnormal glucose    Past Surgical History  Procedure Laterality Date  . Colonoscopy  11/21/2003     rare early left-sided diverticula remaining, low sigmoid anastomosis, internal and external hemorrhoids,   . Cystoscopy    . Lymph node biopsy Right 08/09/2014    Procedure: Right axillary lymph node biopsy;  Surgeon: Alphonsa Overall, MD;  Location: [facility];  Service: General;  Laterality: Right;   Family History  Problem  Relation Age of Onset  . Emphysema Father   . Depression Mother   . Glaucoma Mother   . Thyroid disease Mother   . CVA Mother   . Breast cancer Mother   . Colon cancer Maternal Grandmother   . CVA Brother 61    1/3  . Thyroid disease Sister     1/2, partial thyroidectomy  . Goiter Sister     2/2   History  Substance Use Topics  . Smoking status: Current Every Day Smoker -- 0.50 packs/day for 35 years    Start date: 09/15/1977  . Smokeless tobacco: Not on file  . Alcohol Use: Yes     Comment: 2-3 times per week   OB History    No data available     Review of Systems  Constitutional: Negative for fever.  Respiratory: Positive for shortness of breath. Negative for cough, sputum production and wheezing.   Cardiovascular: Negative for chest pain.  Gastrointestinal: Negative for vomiting and abdominal pain.  Neurological: Negative for headaches.  All other systems reviewed and are negative.     Allergies  Review of patient's allergies indicates no known allergies.  Home Medications   Prior to Admission medications   Medication Sig Start Date End Date Taking? Authorizing Provider  ALPRAZolam Duanne Moron) 0.5 MG tablet Take 1 tablet (0.5 mg total) by mouth 3 (three) times daily as needed for anxiety. 09/04/14   Curt Bears,  MD  ARIPiprazole (ABILIFY) 2 MG tablet Take 2 mg by mouth daily.    Historical Provider, MD  cyclobenzaprine (FLEXERIL) 10 MG tablet Take 1 tablet (10 mg total) by mouth at bedtime. 08/22/14   Debbe Odea, MD  dexamethasone (DECADRON) 4 MG tablet 4 mg by mouth twice a day the day before, day of and day after the chemotherapy every 3 weeks. 09/01/14   Curt Bears, MD  docusate sodium 100 MG CAPS Take 200 mg by mouth 2 (two) times daily. 08/22/14   Debbe Odea, MD  esomeprazole (NEXIUM) 20 MG capsule Take 20 mg by mouth every morning.    Historical Provider, MD  ferrous sulfate 325 (65 FE) MG tablet Take 1 tablet (325 mg total) by mouth 2 (two) times  daily with a meal. 08/22/14   Debbe Odea, MD  FLUoxetine (PROZAC) 40 MG capsule Take 1 capsule (40 mg total) by mouth 2 (two) times daily. 08/22/14   Debbe Odea, MD  folic acid (FOLVITE) 1 MG tablet Take 1 tablet (1 mg total) by mouth daily. 09/01/14   Curt Bears, MD  irbesartan (AVAPRO) 75 MG tablet Take 1 tablet (75 mg total) by mouth daily. 08/22/14   Debbe Odea, MD  ondansetron (ZOFRAN-ODT) 4 MG disintegrating tablet Take 1 tablet (4 mg total) by mouth every 8 (eight) hours as needed for nausea or vomiting. 08/22/14   Debbe Odea, MD  OxyCODONE 30 MG T12A Take 30 mg by mouth every 8 (eight) hours. 08/22/14   Debbe Odea, MD  Oxycodone HCl 10 MG TABS Take 1 tablet (10 mg total) by mouth every 4 (four) hours as needed. 08/28/14   Curt Bears, MD  polyethylene glycol [facility] / GLYCOLAX) packet Take 17 g by mouth daily as needed for mild constipation (give in AM PRN). 08/22/14   Debbe Odea, MD  prochlorperazine (COMPAZINE) 10 MG tablet Take 1 tablet (10 mg total) by mouth every 6 (six) hours as needed for nausea or vomiting. 09/01/14   Curt Bears, MD  senna (SENOKOT) 8.6 MG TABS tablet Take 1 tablet (8.6 mg total) by mouth at bedtime as needed for mild constipation. 08/22/14   Debbe Odea, MD  simvastatin (ZOCOR) 40 MG tablet Take 40 mg by mouth at bedtime.     Historical Provider, MD  tiotropium (SPIRIVA) 18 MCG inhalation capsule Place 18 mcg into inhaler and inhale daily.    Historical Provider, MD  zolpidem (AMBIEN) 10 MG tablet Take 0.5-1 tablets (5-10 mg total) by mouth at bedtime as needed for sleep. 08/22/14   Debbe Odea, MD   Pulse 95  Temp(Src) 102 F (38.9 C) (Rectal)  SpO2 95% Physical Exam  Constitutional: She is oriented to person, place, and time. She appears well-developed and well-nourished. No distress.  HENT:  Head: Normocephalic and atraumatic.  Mouth/Throat: Oropharynx is clear and moist.  Eyes: Conjunctivae and EOM are normal. Pupils are equal, round,  and reactive to light.  Neck: Normal range of motion. Neck supple.  Cardiovascular: Normal rate, regular rhythm and intact distal pulses.   No murmur heard. Pulmonary/Chest: Effort normal. Tachypnea noted. No respiratory distress. She has decreased breath sounds in the right lower field and the left lower field. She has no wheezes. She has no rales.  Able to speak in full sentences  Abdominal: Soft. She exhibits no distension. There is no tenderness. There is no rebound and no guarding.  Musculoskeletal: Normal range of motion. She exhibits no edema.       Right hip:  She exhibits tenderness. She exhibits no bony tenderness and no swelling.       Legs: Pt in a TLSO brace but does not appear to be restricting breathing  Neurological: She is alert and oriented to person, place, and time.  Skin: Skin is warm and dry. No rash noted. No erythema.  Psychiatric: She has a normal mood and affect. Her behavior is normal.  Nursing note and vitals reviewed.   ED Course  Procedures (including critical care time) Labs Review Labs Reviewed  CBC WITH DIFFERENTIAL - Abnormal; Notable for the following:    WBC 12.0 (*)    RBC 3.55 (*)    Hemoglobin 9.9 (*)    HCT 31.4 (*)    Neutrophils Relative % 82 (*)    Neutro Abs 9.8 (*)    Lymphocytes Relative 9 (*)    All other components within normal limits  COMPREHENSIVE METABOLIC PANEL - Abnormal; Notable for the following:    Sodium 132 (*)    Glucose, Bld 112 (*)    Calcium 8.3 (*)    Albumin 2.5 (*)    Alkaline Phosphatase 137 (*)    GFR calc non Af Amer 57 (*)    GFR calc Af Amer 66 (*)    All other components within normal limits  URINALYSIS, ROUTINE W REFLEX MICROSCOPIC - Abnormal; Notable for the following:    Color, Urine ORANGE (*)    APPearance CLOUDY (*)    Specific Gravity, Urine 1.034 (*)    Bilirubin Urine SMALL (*)    Leukocytes, UA TRACE (*)    All other components within normal limits  URINE MICROSCOPIC-ADD ON - Abnormal;  Notable for the following:    Bacteria, UA FEW (*)    All other components within normal limits  I-STAT TROPOININ, ED  I-STAT CG4 LACTIC ACID, ED    Imaging Review Dg Chest 1 View  09/06/2014   CLINICAL DATA:  Shortness of breath, history of carcinoma of the lung  EXAM: CHEST - 1 VIEW  COMPARISON:  08/15/2014  FINDINGS: Cardiac shadow is stable. Increased vascular congestion is well as diffuse interstitial infiltrates are identified bilaterally. These have increased significantly from a prior CT examination from 08/08/2014 as well as a recent chest x-ray. Some of this may be related to the patient's underlying history of carcinoma of the lung. Fullness in the mediastinum is again noted consistent with the patient's known lymphadenopathy.  IMPRESSION: Significant increase in bilateral infiltrates a portion which may be related to the patient's underlying neoplastic history.   Electronically Signed   By: [name] M.D.   On: 09/06/2014 13:25   Dg Hip Complete Right  09/06/2014   CLINICAL DATA:  Fall from car yesterday with right-sided hip pain, initial encounter  EXAM: RIGHT HIP - COMPLETE 2+ VIEW  COMPARISON:  None.  FINDINGS: The pelvic ring is intact. No acute fracture or dislocation is noted. No gross soft tissue abnormality is seen. Postsurgical changes are noted in the pelvis.  IMPRESSION: No acute abnormality noted.   Electronically Signed   By: [name] M.D.   On: 09/06/2014 13:22   Ct Angio Chest Pe W/cm &/or Wo Cm  09/06/2014   CLINICAL DATA:  Shortness of breath. Recent diagnosis of lung cancer.  EXAM: CT ANGIOGRAPHY CHEST WITH CONTRAST  TECHNIQUE: Multidetector CT imaging of the chest was performed using the standard protocol during bolus administration of intravenous contrast. Multiplanar CT image reconstructions and MIPs were obtained to evaluate the vascular  anatomy.  CONTRAST:  124mL OMNIPAQUE IOHEXOL 350 MG/ML SOLN  COMPARISON:  CT chest 08/08/2014  FINDINGS: There is adequate  opacification of the pulmonary arteries. There is no pulmonary embolus. The main pulmonary artery, right main pulmonary artery and left main pulmonary arteries are normal in size. The heart size is normal. There is no pericardial effusion. There is mild left main coronary artery atherosclerosis.  Bilateral diffuse patchy areas of airspace disease with associated ground-glass opacities. Moderate right pleural effusion. Small left pleural effusion. Bibasilar atelectasis. No pneumothorax.  Right supraclavicular lymph node measuring 12 mm in short axis. Right upper paratracheal lymph node measuring 26 mm in short axis. Right axillary lymph node measuring 20 mm in short partial necrotic subcarinal lymph node axis. Measuring 28 mm in short axis.  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  There is a severe T10 vertebral body compression fracture with approximately 75% height loss concerning for pathologic fracture.  Moderate-sized hiatal hernia.  Review of the MIP images confirms the above findings.  IMPRESSION: 1. Interval development of bilateral diffuse patchy airspace disease with ground-glass opacities and bilateral pleural effusions. Findings most concerning for an infectious or inflammatory process including hypersensitivity pneumonitis versus pulmonary edema. 2. Findings concerning for a severe pathologic T10 vertebral body compression fracture. 3. Mediastinal and right hilar lymphadenopathy persists.   Electronically Signed   By: Kathreen Devoid   On: 09/06/2014 15:09     Date: 09/06/2014  Rate: 99  Rhythm: normal sinus rhythm  QRS Axis: normal  Intervals: normal  ST/T Wave abnormalities: normal  Conduction Disutrbances:artifact  Narrative Interpretation:   Old EKG Reviewed: unchanged    MDM   Final diagnoses:  SOB (shortness of breath)  Hypoxia  Healthcare-associated pneumonia    Patient presenting with abrupt onset of shortness of breath that started today. 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 Patient denies fever, productive cough, abdominal pain or vomiting. She states since leaving the hospital she has had shortness of breath but it abruptly worsened today. When EMS arrived patient was satting 75% on 3 L. Here she was satting 71% on 3 L which improved with the nonrebreather. Patient also noted yesterday when she was getting out of the car she felt something pop in her hip and she's had severe right hip/leg pain. Patient is febrile here to 102. Concern for pneumonia or other form of infection versus PE. Patient has significant pain upon ranging the right hip. She has no notable leg swelling.  CBC, CMP, UA, troponin, lactic acid, chest x-ray pending. Patient given Tylenol for fever. She currently has not received any chemotherapy and only radiation  1:54 PM Patient found to have a normal lactic acid, negative troponin, unrevealing EKG, mild cytosis of 12,000 and a relatively normal CMP. Chest x-ray showed significant increase in bilateral infiltrates from prior. Concern for new pneumonia versus worsening neoplasm versus PE. Patient started on pain consistent for healthcare associated pneumonia. Also will do a CTA to ensure patient does not have a PE. Patient is talking in complete sentences and has a blood pressure in the upper 90s. She states she is feeling okay however when nonrebreather is removed patient sats quickly dropped to the 70s.  3:13 PM CT showed no PE and evidence for infection.  BP remains stable in the high 90's.  3:28 PM On re-eval  after nebs pt states breathing better however unable to tolerate [place] and drops to 70's when NRB removed.  Also BP now in the high 80's.  Will give second liter but will discuss with critical care.  Blanchie Dessert, MD 09/06/14 1356  Blanchie Dessert, MD 09/06/14  [place], MD 09/06/14 [place], MD 09/06/14 1529

## 2014-09-07 ENCOUNTER — Inpatient Hospital Stay (HOSPITAL_COMMUNITY): Payer: BC Managed Care – PPO

## 2014-09-07 DIAGNOSIS — C3490 Malignant neoplasm of unspecified part of unspecified bronchus or lung: Secondary | ICD-10-CM

## 2014-09-07 DIAGNOSIS — A419 Sepsis, unspecified organism: Principal | ICD-10-CM

## 2014-09-07 DIAGNOSIS — R6520 Severe sepsis without septic shock: Secondary | ICD-10-CM

## 2014-09-07 LAB — GLUCOSE, CAPILLARY
GLUCOSE-CAPILLARY: 101 mg/dL — AB (ref 70–99)
GLUCOSE-CAPILLARY: 117 mg/dL — AB (ref 70–99)
GLUCOSE-CAPILLARY: 137 mg/dL — AB (ref 70–99)
Glucose-Capillary: 127 mg/dL — ABNORMAL HIGH (ref 70–99)
Glucose-Capillary: 142 mg/dL — ABNORMAL HIGH (ref 70–99)
Glucose-Capillary: 106 mg/dL — ABNORMAL HIGH (ref 70–99)
Glucose-Capillary: 108 mg/dL — ABNORMAL HIGH (ref 70–99)

## 2014-09-07 LAB — INFLUENZA PANEL BY PCR (TYPE A & B)
H1N1 flu by pcr: NOT DETECTED
INFLBPCR: NEGATIVE
Influenza A By PCR: NEGATIVE

## 2014-09-07 LAB — BASIC METABOLIC PANEL
ANION GAP: 7 (ref 5–15)
BUN: 15 mg/dL (ref 6–23)
CALCIUM: 7.8 mg/dL — AB (ref 8.4–10.5)
CO2: 20 mmol/L (ref 19–32)
CREATININE: 0.71 mg/dL (ref 0.50–1.10)
Chloride: 109 mEq/L (ref 96–112)
GFR calc Af Amer: >90 mL/min (ref >90)
Glucose, Bld: 134 mg/dL — ABNORMAL HIGH (ref 70–99)
Potassium: 3.6 mmol/L (ref 3.5–5.1)
SODIUM: 136 mmol/L (ref 135–145)

## 2014-09-07 LAB — CBC
HCT: 29.4 % — ABNORMAL LOW (ref 36.0–46.0)
Hemoglobin: 9.2 g/dL — ABNORMAL LOW (ref 12.0–15.0)
MCH: 27.5 pg (ref 26.0–34.0)
MCHC: 31.3 g/dL (ref 30.0–36.0)
MCV: 87.8 fL (ref 78.0–100.0)
Platelets: 227 10*3/uL (ref 150–400)
RBC: 3.35 MIL/uL — AB (ref 3.87–5.11)
RDW: 14.5 % (ref 11.5–15.5)
WBC: 12.6 10*3/uL — ABNORMAL HIGH (ref 4.0–10.5)

## 2014-09-07 LAB — HIV ANTIBODY (ROUTINE TESTING W REFLEX): HIV: NONREACTIVE

## 2014-09-07 LAB — BRAIN NATRIURETIC PEPTIDE: B Natriuretic Peptide: 142.3 pg/mL — ABNORMAL HIGH (ref 0.0–100.0)

## 2014-09-07 LAB — PROCALCITONIN: Procalcitonin: 0.44 ng/mL

## 2014-09-07 MED ORDER — VANCOMYCIN HCL 10 G IV SOLR
1250.0000 mg | Freq: Two times a day (BID) | INTRAVENOUS | Status: DC
  Administered 2014-09-07 – 2014-09-09 (×5): 1250 mg via INTRAVENOUS
  Filled 2014-09-07 (×5): qty 1250

## 2014-09-07 MED ORDER — METHYLPREDNISOLONE SODIUM SUCC 125 MG IJ SOLR
80.0000 mg | Freq: Every day | INTRAMUSCULAR | Status: DC
  Administered 2014-09-08: 80 mg via INTRAVENOUS
  Filled 2014-09-07: qty 2

## 2014-09-07 MED ORDER — ONDANSETRON HCL 4 MG/2ML IJ SOLN
4.0000 mg | Freq: Four times a day (QID) | INTRAMUSCULAR | Status: DC | PRN
  Administered 2014-09-07 – 2014-09-12 (×4): 4 mg via INTRAVENOUS
  Filled 2014-09-07 (×4): qty 2

## 2014-09-07 MED ORDER — FUROSEMIDE 10 MG/ML IJ SOLN
40.0000 mg | Freq: Once | INTRAMUSCULAR | Status: AC
  Administered 2014-09-07: 40 mg via INTRAVENOUS
  Filled 2014-09-07: qty 4

## 2014-09-07 MED ORDER — OXYCODONE HCL 5 MG PO TABS
5.0000 mg | ORAL_TABLET | ORAL | Status: DC | PRN
  Administered 2014-09-07 – 2014-09-21 (×13): 5 mg via ORAL
  Filled 2014-09-07 (×15): qty 1

## 2014-09-07 NOTE — Progress Notes (Signed)
ANTIBIOTIC CONSULT NOTE   Pharmacy Consult for Vancomycin/cefepime Indication: rule out pneumonia  No Known Allergies  Patient Measurements: Height: 5\' 5"  (165.1 cm) Weight: 207 lb 14.3 oz (94.3 kg) IBW/kg (Calculated) : 57  Vital Signs: Temp: 98.4 F (36.9 C) (12/24 0400) Temp Source: Axillary (12/24 0400) BP: 142/61 mmHg (12/24 0800) Pulse Rate: 85 (12/24 0800) Intake/Output from previous day: 12/23 0701 - 12/24 0700 In: 3133.3 [P.O.:50; I.V.:1283.3; IV Piggyback:1800] Out: 600 [Urine:600] Intake/Output from this shift: Total I/O In: 200 [I.V.:200] Out: -   Labs:  Recent Labs  09/06/14 1208 09/07/14 0345  WBC 12.0* 12.6*  HGB 9.9* 9.2*  PLT 289 227  CREATININE 1.04 0.71   Estimated Creatinine Clearance: 84.9 mL/min (by C-G formula based on Cr of 0.71). No results for input(s): VANCOTROUGH, VANCOPEAK, VANCORANDOM, GENTTROUGH, GENTPEAK, GENTRANDOM, TOBRATROUGH, TOBRAPEAK, TOBRARND, AMIKACINPEAK, AMIKACINTROU, AMIKACIN in the last 72 hours.   Microbiology: Recent Results (from the past 720 hour(s))  Surgical pcr screen     Status: None   Collection Time: 08/09/14  7:29 AM  Result Value Ref Range Status   MRSA, PCR NEGATIVE NEGATIVE Final   Staphylococcus aureus NEGATIVE NEGATIVE Final    Comment:        The Xpert SA Assay (FDA approved for NASAL specimens in patients over 75 years of age), is one component of a comprehensive surveillance program.  Test performance has been validated by [employer] for patients greater than or equal to 35 year old. It is not intended to diagnose infection nor to guide or monitor treatment.   MRSA PCR Screening     Status: None   Collection Time: 09/06/14  5:59 PM  Result Value Ref Range Status   MRSA by PCR NEGATIVE NEGATIVE Final    Comment:        The GeneXpert MRSA Assay (FDA approved for NASAL specimens only), is one component of a comprehensive MRSA colonization surveillance program. It is not intended to  diagnose MRSA infection nor to guide or monitor treatment for MRSA infections.     Medical History: Past Medical History  Diagnosis Date  . History of suicidal ideation   . HTN (hypertension)   . HLD (hyperlipidemia)   . Shortness of breath   . Anxiety and depression   . Menopause   . Benign hematuria   . Impaired fasting glucose   . Other abnormal glucose   . Cancer      Assessment: 63 yo female presents with abrupt onset of SHOB. She was recently diagnosed with metastatic stage IV lung cancer during previous admission 08/08/14-08/22/14. She also notes pain in her right hip after felt something pop after getting out of the car yesterday. CXR shows significant increase in bilateral infiltrates, a portion which may be related to the patient's underlying neoplastic history. CTA reveals no PE but ground glass appearance with infectious vs inflammatory etiology.  12/23 >> vancomycin >> 12/23 >> zosyn x 1 12/23 >> cefepime >>  Tmax: afeb since fever at time of admission WBC: 12.6 (unchanged) Renal: SCr improved overnight (increased from previous admit), CrCl 90N  12/23 Sputum: 12/23 Blood x 2: Legionella Ag: needs collected S. pneumo Ag: needs collected  Goal of Therapy:  Vancomycin trough level 15-20 mcg/ml  Plan:  For improved renal function,  Change vancomycin to 1250mg  IV q12h  Continue cefepime 1gm IV q8h  Doreene Eland, PharmD, BCPS.   Pager: [phone] 09/07/2014,8:14 AM

## 2014-09-07 NOTE — Progress Notes (Signed)
eLink Physician-Brief Progress Note Patient Name: Mosella Kasa [facility] DOB: 24-Jun-1951 MRN: [id number]   Date of Service  09/07/2014  HPI/Events of Note  On bipap Awake & alert  eICU Interventions  Resume low dose oxycodone for cancer pain     Intervention Category Intermediate Interventions: Pain - evaluation and management  ALVA,RAKESH V. 09/07/2014, 12:46 AM

## 2014-09-07 NOTE — Progress Notes (Signed)
Dr. Elsworth Soho at [facility] called to camera in and assess patients respiratory status. Pts vital signs have been stable on BIPAP throughout night except for the patients respirations. They have been consistently between 30 and 40/minute. Due to patients full code status, RN wanted MDs opinion on patients status. MD said to continue to monitor patient until the morning and call back if any decline presents. Will continue to monitor.

## 2014-09-07 NOTE — Progress Notes (Signed)
Attempted to place patient on Venturi mask at 12L (50%). Patient began to increase her work of breathing, and saturations dropped to 86%. I increased flow to 14L (55%). Patient continue to be labored and saturations continued to drop. Placed patient back on NRB at [place]. Patient's saturations went back up to 90's. Will reattempt transition after lasix is given and patient more stable.

## 2014-09-07 NOTE — Progress Notes (Signed)
Name: Emily West [facility] MRN: [id number] DOB: 22-Jul-1951    ADMISSION DATE:  09/06/2014 CONSULTATION DATE:  09/06/2014  REFERRING MD :  Karleen Hampshire  CHIEF COMPLAINT:  SOB  BRIEF PATIENT DESCRIPTION: 63 year old female with recent dx stage IV NSCLC admitted 12/23 for hypoxic respiratory failure 2/2 presumed CAP. Despite high flow O2 she remained hypoxic. PCCM asked to consult.   SIGNIFICANT EVENTS    STUDIES:  Echo 12/1 > LVEF 55-60% CT chest 12/23 > bilateral GGO with bilateral pleural effusions. Pulm edema vs hypersensitivity pneumonitis. Pathologic T10 vertebral body fx.    SUBJECTIVE: Feeling better today, off BIPAP, cough without mucus production  VITAL SIGNS: Temp:  [98.2 F (36.8 C)-102 F (38.9 C)] 98.4 F (36.9 C) (12/24 0400) Pulse Rate:  [80-99] 85 (12/24 0800) Resp:  [14-54] 39 (12/24 0800) BP: (81-158)/(35-70) 142/61 mmHg (12/24 0800) SpO2:  [71 %-100 %] 98 % (12/24 0800) FiO2 (%):  [60 %-100 %] 100 % (12/24 0800) Weight:  [94.3 kg (207 lb 14.3 oz)-99.791 kg (220 lb)] 94.3 kg (207 lb 14.3 oz) (12/24 0400)  PHYSICAL EXAMINATION:  Gen: resting comfortably HEENT: NCAT, EOMi PULM: Crackles Bilaterally CV: RRR, no mgr AB: BS+, soft, nontender Ext: warm no edema Neuro: A&Ox4, maew   Recent Labs Lab 09/01/14 0835 09/06/14 1208 09/07/14 0345  NA 138 132* 136  K 4.8 3.5 3.6  CL  --  99 109  CO2 25 22 20   BUN 13.5 22 15   CREATININE 0.8 1.04 0.71  GLUCOSE 120 112* 134*    Recent Labs Lab 09/01/14 0835 09/06/14 1208 09/07/14 0345  HGB 10.6* 9.9* 9.2*  HCT 34.0* 31.4* 29.4*  WBC 8.6 12.0* 12.6*  PLT 299 289 227   Dg Chest 1 View  09/06/2014   CLINICAL DATA:  Shortness of breath, history of carcinoma of the lung  EXAM: CHEST - 1 VIEW  COMPARISON:  08/15/2014  FINDINGS: Cardiac shadow is stable. Increased vascular congestion is well as diffuse interstitial infiltrates are identified bilaterally. These have increased significantly from a prior CT  examination from 08/08/2014 as well as a recent chest x-ray. Some of this may be related to the patient's underlying history of carcinoma of the lung. Fullness in the mediastinum is again noted consistent with the patient's known lymphadenopathy.  IMPRESSION: Significant increase in bilateral infiltrates a portion which may be related to the patient's underlying neoplastic history.   Electronically Signed   By: [name] M.D.   On: 09/06/2014 13:25   Dg Hip Complete Right  09/06/2014   CLINICAL DATA:  Fall from car yesterday with right-sided hip pain, initial encounter  EXAM: RIGHT HIP - COMPLETE 2+ VIEW  COMPARISON:  None.  FINDINGS: The pelvic ring is intact. No acute fracture or dislocation is noted. No gross soft tissue abnormality is seen. Postsurgical changes are noted in the pelvis.  IMPRESSION: No acute abnormality noted.   Electronically Signed   By: [name] M.D.   On: 09/06/2014 13:22   Ct Angio Chest Pe W/cm &/or Wo Cm  09/06/2014   CLINICAL DATA:  Shortness of breath. Recent diagnosis of lung cancer.  EXAM: CT ANGIOGRAPHY CHEST WITH CONTRAST  TECHNIQUE: Multidetector CT imaging of the chest was performed using the standard protocol during bolus administration of intravenous contrast. Multiplanar CT image reconstructions and MIPs were obtained to evaluate the vascular anatomy.  CONTRAST:  16mL OMNIPAQUE IOHEXOL 350 MG/ML SOLN  COMPARISON:  CT chest 08/08/2014  FINDINGS: There is adequate opacification of  the pulmonary arteries. There is no pulmonary embolus. The main pulmonary artery, right main pulmonary artery and left main pulmonary arteries are normal in size. The heart size is normal. There is no pericardial effusion. There is mild left main coronary artery atherosclerosis.  Bilateral diffuse patchy areas of airspace disease with associated ground-glass opacities. Moderate right pleural effusion. Small left pleural effusion. Bibasilar atelectasis. No pneumothorax.  Right  supraclavicular lymph node measuring 12 mm in short axis. Right upper paratracheal lymph node measuring 26 mm in short axis. Right axillary lymph node measuring 20 mm in short partial necrotic subcarinal lymph node axis. Measuring 28 mm in short axis.  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  There is a severe T10 vertebral body compression fracture with approximately 75% height loss concerning for pathologic fracture.  Moderate-sized hiatal hernia.  Review of the MIP images confirms the above findings.  IMPRESSION: 1. Interval development of bilateral diffuse patchy airspace disease with ground-glass opacities and bilateral pleural effusions. Findings most concerning for an infectious or inflammatory process including hypersensitivity pneumonitis versus pulmonary edema. 2. Findings concerning for a severe pathologic T10 vertebral body compression fracture. 3. Mediastinal and right hilar lymphadenopathy persists.   Electronically Signed   By: Kathreen Devoid   On: 09/06/2014 15:09   Dg Chest Port 1 View  09/07/2014   CLINICAL DATA:  Respiratory failure.  Non-small-cell lung cancer  EXAM: PORTABLE CHEST - 1 VIEW  COMPARISON:  09/06/2014  FINDINGS: Progression of diffuse bilateral airspace disease which may represent diffuse edema or pneumonia. Small pleural effusions are noted on the CT yesterday. No pneumothorax.  IMPRESSION: Progression of diffuse bilateral airspace disease consistent with edema or infection.   Electronically Signed   By: Franchot Gallo M.D.   On: 09/07/2014 07:12    ASSESSMENT / PLAN:  PULMONARY A: Acute hypoxemic respiratory failure. ddx includes pulmonary edema, HCAP, influenza Bilateral pleural effusions Stage IV Non Small Cell Cancer (Lung?)  P:   Wean FiO2 BIPAP PRN Titrate FiO2 to maintatin SpO2 > 92-97% Wean solumedrol, not sure what we are treating with that See ID Check proBNP Lasix x1 Out of bed Incentive spirometry PRN albuterol Trend  CXR  CARDIOVASCULAR A:  Hypotension resolved H/o HTN Chronic diastolic heart failure, doubt exacerbation P:  Telemetry KVO fluids Holding home anti-hypertensive's for now (irbesartan) Continue statin  RENAL A:   No acute issues P:   Follow Bmet I&O  GASTROINTESTINAL A:   No acute issue P:   Advance diet Continue home PPI RN to hold PO meds tonight  HEMATOLOGIC A:   Anemia, chronic P:  Follow CBC  INFECTIOUS A:   Severe sepsis 2/2 HCAP > improving  P:   BCx2 12/23 >>> UC 12/23 >>> Sputum 12/23 >>> Abx: cefepime, start date 12/23>>> plan 5 days Abx: vancomycin, start date 12/23>>> d/c 12/25 if continues to improve  Trend WBC and fever curve  ENDOCRINE A:   No acute issues    P:   Follow glucose on chemistries  NEUROLOGIC A:   Anxiety Depression  P:   RASS goal: 0 Hold sedating meds for tonight (xanax, fluoxetine, oxycodone)   FAMILY  - Updates: husband updated by phone  - Inter-disciplinary family meet or Palliative Care meeting due by:  11/30  Code: full for now, she is very anxious with questioning, husband says he thinks that she would want DNR, but they want to discuss together and then get back with [place]  Roselie Awkward, MD   PCCM Pager: [phone] Cell: [phone] If no response, call [phone]

## 2014-09-07 NOTE — Progress Notes (Signed)
Made second attempt to transition patient onto Venturi mask. Patient's saturation dropping to low 80s and patient appearing in distress. Placed NRB back on patient. O2 sats back to 98% and patient back to resting.

## 2014-09-08 ENCOUNTER — Inpatient Hospital Stay (HOSPITAL_COMMUNITY): Payer: BC Managed Care – PPO

## 2014-09-08 DIAGNOSIS — C7931 Secondary malignant neoplasm of brain: Secondary | ICD-10-CM

## 2014-09-08 DIAGNOSIS — C7951 Secondary malignant neoplasm of bone: Secondary | ICD-10-CM

## 2014-09-08 DIAGNOSIS — J449 Chronic obstructive pulmonary disease, unspecified: Secondary | ICD-10-CM

## 2014-09-08 LAB — GLUCOSE, CAPILLARY
GLUCOSE-CAPILLARY: 100 mg/dL — AB (ref 70–99)
GLUCOSE-CAPILLARY: 91 mg/dL (ref 70–99)
GLUCOSE-CAPILLARY: 120 mg/dL — AB (ref 70–99)
Glucose-Capillary: 111 mg/dL — ABNORMAL HIGH (ref 70–99)
Glucose-Capillary: 131 mg/dL — ABNORMAL HIGH (ref 70–99)

## 2014-09-08 LAB — PROCALCITONIN: Procalcitonin: 0.3 ng/mL

## 2014-09-08 LAB — BASIC METABOLIC PANEL
Anion gap: 8 (ref 5–15)
BUN: 17 mg/dL (ref 6–23)
CO2: 24 mmol/L (ref 19–32)
Calcium: 7.9 mg/dL — ABNORMAL LOW (ref 8.4–10.5)
Chloride: 102 mEq/L (ref 96–112)
Creatinine, Ser: 0.61 mg/dL (ref 0.50–1.10)
GFR calc Af Amer: >90 mL/min (ref >90)
GFR calc non Af Amer: >90 mL/min (ref >90)
Glucose, Bld: 101 mg/dL — ABNORMAL HIGH (ref 70–99)
Potassium: 3.4 mmol/L — ABNORMAL LOW (ref 3.5–5.1)
Sodium: 134 mmol/L — ABNORMAL LOW (ref 135–145)

## 2014-09-08 MED ORDER — CETYLPYRIDINIUM CHLORIDE 0.05 % MT LIQD
7.0000 mL | Freq: Two times a day (BID) | OROMUCOSAL | Status: DC
  Administered 2014-09-08 – 2014-09-21 (×18): 7 mL via OROMUCOSAL

## 2014-09-08 MED ORDER — METHYLPREDNISOLONE SODIUM SUCC 125 MG IJ SOLR
80.0000 mg | Freq: Two times a day (BID) | INTRAMUSCULAR | Status: DC
  Administered 2014-09-08 – 2014-09-15 (×14): 80 mg via INTRAVENOUS
  Filled 2014-09-08 (×14): qty 2

## 2014-09-08 MED ORDER — POTASSIUM CHLORIDE 10 MEQ/100ML IV SOLN
10.0000 meq | INTRAVENOUS | Status: AC
Start: 2014-09-08 — End: 2014-09-08
  Administered 2014-09-08 (×2): 10 meq via INTRAVENOUS
  Filled 2014-09-08: qty 100

## 2014-09-08 MED ORDER — CHLORHEXIDINE GLUCONATE 0.12 % MT SOLN
15.0000 mL | Freq: Two times a day (BID) | OROMUCOSAL | Status: DC
  Administered 2014-09-08 – 2014-09-21 (×23): 15 mL via OROMUCOSAL
  Filled 2014-09-08 (×30): qty 15

## 2014-09-08 MED ORDER — POTASSIUM CHLORIDE 10 MEQ/100ML IV SOLN
INTRAVENOUS | Status: AC
  Filled 2014-09-08: qty 100

## 2014-09-08 MED ORDER — SODIUM CHLORIDE 0.9 % IV SOLN
INTRAVENOUS | Status: DC
  Administered 2014-09-08: 500 mL via INTRAVENOUS
  Administered 2014-09-11 – 2014-09-21 (×3): via INTRAVENOUS

## 2014-09-08 NOTE — Progress Notes (Signed)
Name: Emily West [facility] MRN: [id number] DOB: 07-29-1951    ADMISSION DATE:  09/06/2014 CONSULTATION DATE:  09/06/2014  REFERRING MD :  Karleen Hampshire  CHIEF COMPLAINT:  SOB  BRIEF PATIENT DESCRIPTION: 63 year old female with recent dx stage IV NSCLC admitted 12/23 for hypoxic respiratory failure 2/2 presumed CAP. Despite high flow O2 she remained hypoxic. PCCM asked to consult. - was on avastin & alimta PRIOR THERAPY:  1) Stereotactic radiotherapy to the metastatic bone lesions at T10 and L3 under the care of Dr. [name] and Dr. Vertell Limber. 2) stereotactic radiotherapy to a solitary brain lesion under the care of Dr. [name].  SIGNIFICANT EVENTS    STUDIES:  Echo 12/1 > LVEF 55-60% CT chest 12/23 > bilateral GGO with bilateral pleural effusions. Pulm edema vs hypersensitivity pneumonitis. Pathologic T10 vertebral body fx.    SUBJECTIVE:Remains dyspneic, off BIPAP, on NRB, cough without mucus production  VITAL SIGNS: Temp:  [98.3 F (36.8 C)-99.4 F (37.4 C)] 99.4 F (37.4 C) (12/25 0800) Pulse Rate:  [82-101] 99 (12/25 0800) Resp:  [21-41] 37 (12/25 0800) BP: (101-150)/(42-60) 102/43 mmHg (12/25 0800) SpO2:  [83 %-100 %] 91 % (12/25 0800) FiO2 (%):  [50 %-100 %] 100 % (12/25 0600) Weight:  [89.6 kg (197 lb 8.5 oz)] 89.6 kg (197 lb 8.5 oz) (12/25 0500)  PHYSICAL EXAMINATION:  Gen: resting comfortably HEENT: NCAT, EOMi PULM: Crackles Bilaterally scatterd CV: RRR, no mgr AB: BS+, soft, nontender Ext: warm no edema Neuro: A&Ox4, maew   Recent Labs Lab 09/06/14 1208 09/07/14 0345 09/08/14 0650  NA 132* 136 134*  K 3.5 3.6 3.4*  CL 99 109 102  CO2 22 20 24   BUN 22 15 17   CREATININE 1.04 0.71 0.61  GLUCOSE 112* 134* 101*    Recent Labs Lab 09/06/14 1208 09/07/14 0345  HGB 9.9* 9.2*  HCT 31.4* 29.4*  WBC 12.0* 12.6*  PLT 289 227   Dg Chest 1 View  09/06/2014   CLINICAL DATA:  Shortness of breath, history of carcinoma of the lung  EXAM: CHEST - 1 VIEW   COMPARISON:  08/15/2014  FINDINGS: Cardiac shadow is stable. Increased vascular congestion is well as diffuse interstitial infiltrates are identified bilaterally. These have increased significantly from a prior CT examination from 08/08/2014 as well as a recent chest x-ray. Some of this may be related to the patient's underlying history of carcinoma of the lung. Fullness in the mediastinum is again noted consistent with the patient's known lymphadenopathy.  IMPRESSION: Significant increase in bilateral infiltrates a portion which may be related to the patient's underlying neoplastic history.   Electronically Signed   By: [name] M.D.   On: 09/06/2014 13:25   Dg Hip Complete Right  09/06/2014   CLINICAL DATA:  Fall from car yesterday with right-sided hip pain, initial encounter  EXAM: RIGHT HIP - COMPLETE 2+ VIEW  COMPARISON:  None.  FINDINGS: The pelvic ring is intact. No acute fracture or dislocation is noted. No gross soft tissue abnormality is seen. Postsurgical changes are noted in the pelvis.  IMPRESSION: No acute abnormality noted.   Electronically Signed   By: [name] M.D.   On: 09/06/2014 13:22   Ct Angio Chest Pe W/cm &/or Wo Cm  09/06/2014   CLINICAL DATA:  Shortness of breath. Recent diagnosis of lung cancer.  EXAM: CT ANGIOGRAPHY CHEST WITH CONTRAST  TECHNIQUE: Multidetector CT imaging of the chest was performed using the standard protocol during bolus administration of intravenous contrast. Multiplanar CT  image reconstructions and MIPs were obtained to evaluate the vascular anatomy.  CONTRAST:  11mL OMNIPAQUE IOHEXOL 350 MG/ML SOLN  COMPARISON:  CT chest 08/08/2014  FINDINGS: There is adequate opacification of the pulmonary arteries. There is no pulmonary embolus. The main pulmonary artery, right main pulmonary artery and left main pulmonary arteries are normal in size. The heart size is normal. There is no pericardial effusion. There is mild left main coronary artery atherosclerosis.   Bilateral diffuse patchy areas of airspace disease with associated ground-glass opacities. Moderate right pleural effusion. Small left pleural effusion. Bibasilar atelectasis. No pneumothorax.  Right supraclavicular lymph node measuring 12 mm in short axis. Right upper paratracheal lymph node measuring 26 mm in short axis. Right axillary lymph node measuring 20 mm in short partial necrotic subcarinal lymph node axis. Measuring 28 mm in short axis.  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  There is a severe T10 vertebral body compression fracture with approximately 75% height loss concerning for pathologic fracture.  Moderate-sized hiatal hernia.  Review of the MIP images confirms the above findings.  IMPRESSION: 1. Interval development of bilateral diffuse patchy airspace disease with ground-glass opacities and bilateral pleural effusions. Findings most concerning for an infectious or inflammatory process including hypersensitivity pneumonitis versus pulmonary edema. 2. Findings concerning for a severe pathologic T10 vertebral body compression fracture. 3. Mediastinal and right hilar lymphadenopathy persists.   Electronically Signed   By: Kathreen Devoid   On: 09/06/2014 15:09   Dg Chest Port 1 View  09/08/2014   CLINICAL DATA:  Respiratory failure, hypoxia  EXAM: PORTABLE CHEST - 1 VIEW  COMPARISON:  09/07/2014  FINDINGS: Cardiac shadow is stable. The lungs are well aerated with diffuse bilateral opacities stable from the prior exam. Small amount of right-sided pleural fluid is noted capping along the right apex. This is stable in appearance. No new focal abnormality is seen.  IMPRESSION: No significant interval change in bilateral airspace disease when compared with the prior exam.   Electronically Signed   By: [name] M.D.   On: 09/08/2014 07:16   Dg Chest Port 1 View  09/07/2014   CLINICAL DATA:  Respiratory failure.  Non-small-cell lung cancer  EXAM: PORTABLE CHEST - 1 VIEW   COMPARISON:  09/06/2014  FINDINGS: Progression of diffuse bilateral airspace disease which may represent diffuse edema or pneumonia. Small pleural effusions are noted on the CT yesterday. No pneumothorax.  IMPRESSION: Progression of diffuse bilateral airspace disease consistent with edema or infection.   Electronically Signed   By: Franchot Gallo M.D.   On: 09/07/2014 07:12    ASSESSMENT / PLAN:  PULMONARY A: Acute hypoxemic respiratory failure. 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  P:   BIPAP PRN Titrate FiO2 to maintatin SpO2 > 92-97% solumedrol 80 q 12h Out of bed Incentive spirometry PRN albuterol Trend CXR & FIO2 for improvement  CARDIOVASCULAR A:  Hypotension resolved H/o HTN Chronic diastolic heart failure, doubt exacerbation P:  Telemetry Check proBNP KVO fluids Holding home anti-hypertensive's for now (irbesartan) Continue statin  RENAL A:   No acute issues P:   Follow Bmet I&O  GASTROINTESTINAL A:   No acute issue P:   Advance diet Continue home PPI RN to hold PO meds tonight  HEMATOLOGIC A:   Anemia, chronic P:  Follow CBC  INFECTIOUS A:   Severe sepsis 2/2 HCAP > improving  P:  BCx2 12/23 >>>ng UC 12/23 >>> Sputum 12/23 >>> Abx: cefepime, start date 12/23>>> plan 5 days Abx: vancomycin, start date 12/23>>> d/c 12/25 if continues to improve  Trend WBC and fever curve, low pct reassuring  ENDOCRINE A:   No acute issues    P:   Follow glucose on chemistries  NEUROLOGIC A:   Anxiety Depression  P:   RASS goal: 0 Hold sedating meds for now  (xanax, fluoxetine, oxycodone)   FAMILY  - Updates: husband updated by phone  - Inter-disciplinary family meet or Palliative Care meeting due by:  11/30  Code: full for now, await her decision about advance directives Unclear cause of pneumonitis  Rigoberto Noel. MD

## 2014-09-08 NOTE — Progress Notes (Signed)
Subjective: The patient is seen and examined today. She was admitted recently with significant dyspnea and fever as well as hypotension. CT angiogram of the chest showed no evidence for pulmonary embolism but there was ground glass opacities consistent with infectious/inflammatory etiology. The patient was started on BiPAP and currently undergoing aggressive treatment for pneumonia. She is feeling a little bit better today. She denied having any fever or chills. She was supposed to start systemic chemotherapy next week.  Objective: Vital signs in last 24 hours: Temp:  [98.3 F (36.8 C)-99.4 F (37.4 C)] 99.4 F (37.4 C) (12/25 0800) Pulse Rate:  [84-101] 92 (12/25 1200) Resp:  [22-42] 42 (12/25 1200) BP: (101-130)/(41-57) 124/41 mmHg (12/25 1200) SpO2:  [83 %-100 %] 94 % (12/25 1200) FiO2 (%):  [60 %-100 %] 60 % (12/25 1200) Weight:  [197 lb 8.5 oz (89.6 kg)] 197 lb 8.5 oz (89.6 kg) (12/25 0500)  Intake/Output from previous day: 12/24 0701 - 12/25 0700 In: 3000 [P.O.:50; I.V.:2300; IV Piggyback:650] Out: 2000 [Urine:2000] Intake/Output this shift: Total I/O In: 50 [Other:50] Out: 300 [Urine:300]  General appearance: alert, cooperative, fatigued and mild distress Resp: rales bilaterally Cardio: regular rate and rhythm, S1, S2 normal, no murmur, click, rub or gallop GI: soft, non-tender; bowel sounds normal; no masses,  no organomegaly Extremities: extremities normal, atraumatic, no cyanosis or edema  Lab Results:   Recent Labs  09/06/14 1208 09/07/14 0345  WBC 12.0* 12.6*  HGB 9.9* 9.2*  HCT 31.4* 29.4*  PLT 289 227   BMET  Recent Labs  09/07/14 0345 09/08/14 0650  NA 136 134*  K 3.6 3.4*  CL 109 102  CO2 20 24  GLUCOSE 134* 101*  BUN 15 17  CREATININE 0.71 0.61  CALCIUM 7.8* 7.9*    Studies/Results: Dg Chest 1 View  09/06/2014   CLINICAL DATA:  Shortness of breath, history of carcinoma of the lung  EXAM: CHEST - 1 VIEW  COMPARISON:  08/15/2014  FINDINGS:  Cardiac shadow is stable. Increased vascular congestion is well as diffuse interstitial infiltrates are identified bilaterally. These have increased significantly from a prior CT examination from 08/08/2014 as well as a recent chest x-ray. Some of this may be related to the patient's underlying history of carcinoma of the lung. Fullness in the mediastinum is again noted consistent with the patient's known lymphadenopathy.  IMPRESSION: Significant increase in bilateral infiltrates a portion which may be related to the patient's underlying neoplastic history.   Electronically Signed   By: [name] M.D.   On: 09/06/2014 13:25   Dg Hip Complete Right  09/06/2014   CLINICAL DATA:  Fall from car yesterday with right-sided hip pain, initial encounter  EXAM: RIGHT HIP - COMPLETE 2+ VIEW  COMPARISON:  None.  FINDINGS: The pelvic ring is intact. No acute fracture or dislocation is noted. No gross soft tissue abnormality is seen. Postsurgical changes are noted in the pelvis.  IMPRESSION: No acute abnormality noted.   Electronically Signed   By: [name] M.D.   On: 09/06/2014 13:22   Ct Angio Chest Pe W/cm &/or Wo Cm  09/06/2014   CLINICAL DATA:  Shortness of breath. Recent diagnosis of lung cancer.  EXAM: CT ANGIOGRAPHY CHEST WITH CONTRAST  TECHNIQUE: Multidetector CT imaging of the chest was performed using the standard protocol during bolus administration of intravenous contrast. Multiplanar CT image reconstructions and MIPs were obtained to evaluate the vascular anatomy.  CONTRAST:  165mL OMNIPAQUE IOHEXOL 350 MG/ML SOLN  COMPARISON:  CT  chest 08/08/2014  FINDINGS: There is adequate opacification of the pulmonary arteries. There is no pulmonary embolus. The main pulmonary artery, right main pulmonary artery and left main pulmonary arteries are normal in size. The heart size is normal. There is no pericardial effusion. There is mild left main coronary artery atherosclerosis.  Bilateral diffuse patchy areas of  airspace disease with associated ground-glass opacities. Moderate right pleural effusion. Small left pleural effusion. Bibasilar atelectasis. No pneumothorax.  Right supraclavicular lymph node measuring 12 mm in short axis. Right upper paratracheal lymph node measuring 26 mm in short axis. Right axillary lymph node measuring 20 mm in short partial necrotic subcarinal lymph node axis. Measuring 28 mm in short axis.  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  There is a severe T10 vertebral body compression fracture with approximately 75% height loss concerning for pathologic fracture.  Moderate-sized hiatal hernia.  Review of the MIP images confirms the above findings.  IMPRESSION: 1. Interval development of bilateral diffuse patchy airspace disease with ground-glass opacities and bilateral pleural effusions. Findings most concerning for an infectious or inflammatory process including hypersensitivity pneumonitis versus pulmonary edema. 2. Findings concerning for a severe pathologic T10 vertebral body compression fracture. 3. Mediastinal and right hilar lymphadenopathy persists.   Electronically Signed   By: Kathreen Devoid   On: 09/06/2014 15:09   Dg Chest Port 1 View  09/08/2014   CLINICAL DATA:  Respiratory failure, hypoxia  EXAM: PORTABLE CHEST - 1 VIEW  COMPARISON:  09/07/2014  FINDINGS: Cardiac shadow is stable. The lungs are well aerated with diffuse bilateral opacities stable from the prior exam. Small amount of right-sided pleural fluid is noted capping along the right apex. This is stable in appearance. No new focal abnormality is seen.  IMPRESSION: No significant interval change in bilateral airspace disease when compared with the prior exam.   Electronically Signed   By: [name] M.D.   On: 09/08/2014 07:16   Dg Chest Port 1 View  09/07/2014   CLINICAL DATA:  Respiratory failure.  Non-small-cell lung cancer  EXAM: PORTABLE CHEST - 1 VIEW  COMPARISON:  09/06/2014  FINDINGS:  Progression of diffuse bilateral airspace disease which may represent diffuse edema or pneumonia. Small pleural effusions are noted on the CT yesterday. No pneumothorax.  IMPRESSION: Progression of diffuse bilateral airspace disease consistent with edema or infection.   Electronically Signed   By: Franchot Gallo M.D.   On: 09/07/2014 07:12    Medications: I have reviewed the patient's current medications.  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 She status post a stereotactic radiotherapy to the bone and brain lesion. She was supposed to start systemic chemotherapy with carboplatin, Alimta and Avastin next week. I would hold the start of her chemotherapy until improvement in her condition. For the progressive bilateral airspace disease, the patient will continue the antibiotic coverage with cefepime and vancomycin. For COPD, she is currently on prednisone and albuterol. Thank you for taking good care of Ms. Funke. I will continue to follow up the patient with you and assist in her management  LOS: 2 days    Emily Darnold K. 09/08/2014

## 2014-09-08 NOTE — Progress Notes (Signed)
[facility] PROGRESS NOTE  Emily West [facility] UXN:[id number] DOB: 01/12/1951 DOA: 09/06/2014 PCP: [name], MD  Assessment/Plan: 1. Acute hypoxic respiratory failure: Admitted to step down, currently requiring NRB since admission. Unclear etiology probably pneumonitis from infectious process. Minimal pleural effusions on CXR.  Incentive spirometry, resuem IV antibiotics and IV steroids. Nebulizer treatments if needed. Keep sats greater than 90%. Influenza PCR is negative.  PCCM consulted and recommendations given.  Currently she is full code    NSCLC with bone and brain mets: - s/p palliative radiation treatment. Chemo scheduled for 12/30. Unclear if she can tolerate chemo in this state.  Dr [name] on board .      Code Status: full code Family Communication: none at bedside Disposition Plan: remain in step down.    Consultants:  PCCM  ONCOLOGY  Procedures:  none  Antibiotics:  IV vancomycin   IV CEFEPIME 12/23  HPI/Subjective: Feeling, ok, no chest pain , no nausea or abdominal pain. No appetite.   Objective: Filed Vitals:   09/08/14 0800  BP: 102/43  Pulse: 99  Temp: 99.4 F (37.4 C)  Resp: 37    Intake/Output Summary (Last 24 hours) at 09/08/14 0931 Last data filed at 09/08/14 0503  Gross per 24 hour  Intake   2450 ml  Output   1800 ml  Net    650 ml   Filed Weights   09/06/14 1340 09/07/14 0400 09/08/14 0500  Weight: 99.791 kg (220 lb) 94.3 kg (207 lb 14.3 oz) 89.6 kg (197 lb 8.5 oz)    Exam:   General:  Alert afebrile comfortable  Cardiovascular: s1s2  Respiratory: diminished air entry at bases  Abdomen: soft non tender non distended bowel sounds heard  Musculoskeletal: no pedal edema.   Data Reviewed: Basic Metabolic Panel:  Recent Labs Lab 09/06/14 1208 09/07/14 0345 09/08/14 0650  NA 132* 136 134*  K 3.5 3.6 3.4*  CL 99 109 102  CO2 22 20 24   GLUCOSE 112* 134* 101*  BUN 22 15 17   CREATININE 1.04 0.71 0.61   CALCIUM 8.3* 7.8* 7.9*   Liver Function Tests:  Recent Labs Lab 09/06/14 1208  AST 21  ALT 12  ALKPHOS 137*  BILITOT 0.9  PROT 6.6  ALBUMIN 2.5*   No results for input(s): LIPASE, AMYLASE in the last 168 hours. No results for input(s): AMMONIA in the last 168 hours. CBC:  Recent Labs Lab 09/06/14 1208 09/07/14 0345  WBC 12.0* 12.6*  NEUTROABS 9.8*  --   HGB 9.9* 9.2*  HCT 31.4* 29.4*  MCV 88.5 87.8  PLT 289 227   Cardiac Enzymes: No results for input(s): CKTOTAL, CKMB, CKMBINDEX, TROPONINI in the last 168 hours. BNP (last 3 results)  Recent Labs  08/15/14 0425  PROBNP 114.8   CBG:  Recent Labs Lab 09/07/14 1603 09/07/14 1952 09/07/14 2337 09/08/14 0319 09/08/14 0801  GLUCAP 106* 108* 117* 100* 91    Recent Results (from the past 240 hour(s))  Blood culture (routine x 2)     Status: None (Preliminary result)   Collection Time: 09/06/14  3:32 PM  Result Value Ref Range Status   Specimen Description BLOOD LEFT FOREARM  Final   Special Requests BOTTLES DRAWN AEROBIC AND ANAEROBIC 5CC  Final   Culture  Setup Time   Final    09/06/2014 20:48 Performed at [facility]    Culture   Final           BLOOD CULTURE RECEIVED NO GROWTH TO  DATE CULTURE WILL BE HELD FOR 5 DAYS BEFORE ISSUING A FINAL NEGATIVE REPORT Performed at [facility]    Report Status PENDING  Incomplete  Blood culture (routine x 2)     Status: None (Preliminary result)   Collection Time: 09/06/14  3:33 PM  Result Value Ref Range Status   Specimen Description BLOOD LEFT ANTECUBITAL  Final   Special Requests BOTTLES DRAWN AEROBIC AND ANAEROBIC 5CC  Final   Culture  Setup Time   Final    09/06/2014 20:48 Performed at [facility]    Culture   Final           BLOOD CULTURE RECEIVED NO GROWTH TO DATE CULTURE WILL BE HELD FOR 5 DAYS BEFORE ISSUING A FINAL NEGATIVE REPORT Performed at [facility]    Report Status PENDING  Incomplete  MRSA PCR Screening      Status: None   Collection Time: 09/06/14  5:59 PM  Result Value Ref Range Status   MRSA by PCR NEGATIVE NEGATIVE Final    Comment:        The GeneXpert MRSA Assay (FDA approved for NASAL specimens only), is one component of a comprehensive MRSA colonization surveillance program. It is not intended to diagnose MRSA infection nor to guide or monitor treatment for MRSA infections.      Studies: Dg Chest 1 View  09/06/2014   CLINICAL DATA:  Shortness of breath, history of carcinoma of the lung  EXAM: CHEST - 1 VIEW  COMPARISON:  08/15/2014  FINDINGS: Cardiac shadow is stable. Increased vascular congestion is well as diffuse interstitial infiltrates are identified bilaterally. These have increased significantly from a prior CT examination from 08/08/2014 as well as a recent chest x-ray. Some of this may be related to the patient's underlying history of carcinoma of the lung. Fullness in the mediastinum is again noted consistent with the patient's known lymphadenopathy.  IMPRESSION: Significant increase in bilateral infiltrates a portion which may be related to the patient's underlying neoplastic history.   Electronically Signed   By: [name] M.D.   On: 09/06/2014 13:25   Dg Hip Complete Right  09/06/2014   CLINICAL DATA:  Fall from car yesterday with right-sided hip pain, initial encounter  EXAM: RIGHT HIP - COMPLETE 2+ VIEW  COMPARISON:  None.  FINDINGS: The pelvic ring is intact. No acute fracture or dislocation is noted. No gross soft tissue abnormality is seen. Postsurgical changes are noted in the pelvis.  IMPRESSION: No acute abnormality noted.   Electronically Signed   By: [name] M.D.   On: 09/06/2014 13:22   Ct Angio Chest Pe W/cm &/or Wo Cm  09/06/2014   CLINICAL DATA:  Shortness of breath. Recent diagnosis of lung cancer.  EXAM: CT ANGIOGRAPHY CHEST WITH CONTRAST  TECHNIQUE: Multidetector CT imaging of the chest was performed using the standard protocol during bolus  administration of intravenous contrast. Multiplanar CT image reconstructions and MIPs were obtained to evaluate the vascular anatomy.  CONTRAST:  135mL OMNIPAQUE IOHEXOL 350 MG/ML SOLN  COMPARISON:  CT chest 08/08/2014  FINDINGS: There is adequate opacification of the pulmonary arteries. There is no pulmonary embolus. The main pulmonary artery, right main pulmonary artery and left main pulmonary arteries are normal in size. The heart size is normal. There is no pericardial effusion. There is mild left main coronary artery atherosclerosis.  Bilateral diffuse patchy areas of airspace disease with associated ground-glass opacities. Moderate right pleural effusion. Small left pleural effusion. Bibasilar atelectasis.  No pneumothorax.  Right supraclavicular lymph node measuring 12 mm in short axis. Right upper paratracheal lymph node measuring 26 mm in short axis. Right axillary lymph node measuring 20 mm in short partial necrotic subcarinal lymph node axis. Measuring 28 mm in short axis.  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  There is a severe T10 vertebral body compression fracture with approximately 75% height loss concerning for pathologic fracture.  Moderate-sized hiatal hernia.  Review of the MIP images confirms the above findings.  IMPRESSION: 1. Interval development of bilateral diffuse patchy airspace disease with ground-glass opacities and bilateral pleural effusions. Findings most concerning for an infectious or inflammatory process including hypersensitivity pneumonitis versus pulmonary edema. 2. Findings concerning for a severe pathologic T10 vertebral body compression fracture. 3. Mediastinal and right hilar lymphadenopathy persists.   Electronically Signed   By: Kathreen Devoid   On: 09/06/2014 15:09   Dg Chest Port 1 View  09/08/2014   CLINICAL DATA:  Respiratory failure, hypoxia  EXAM: PORTABLE CHEST - 1 VIEW  COMPARISON:  09/07/2014  FINDINGS: Cardiac shadow is stable. The lungs are  well aerated with diffuse bilateral opacities stable from the prior exam. Small amount of right-sided pleural fluid is noted capping along the right apex. This is stable in appearance. No new focal abnormality is seen.  IMPRESSION: No significant interval change in bilateral airspace disease when compared with the prior exam.   Electronically Signed   By: [name] M.D.   On: 09/08/2014 07:16   Dg Chest Port 1 View  09/07/2014   CLINICAL DATA:  Respiratory failure.  Non-small-cell lung cancer  EXAM: PORTABLE CHEST - 1 VIEW  COMPARISON:  09/06/2014  FINDINGS: Progression of diffuse bilateral airspace disease which may represent diffuse edema or pneumonia. Small pleural effusions are noted on the CT yesterday. No pneumothorax.  IMPRESSION: Progression of diffuse bilateral airspace disease consistent with edema or infection.   Electronically Signed   By: Franchot Gallo M.D.   On: 09/07/2014 07:12    Scheduled Meds: . ARIPiprazole  2 mg Oral Daily  . ceFEPime (MAXIPIME) IV  1 g Intravenous 3 times per day  . enoxaparin (LOVENOX) injection  40 mg Subcutaneous Q24H  . ferrous sulfate  325 mg Oral BID WC  . FLUoxetine  40 mg Oral BID  . folic acid  1 mg Oral Daily  . insulin aspart  0-15 Units Subcutaneous 6 times per day  . lactose free nutrition  237 mL Oral BID  . methylPREDNISolone (SOLU-MEDROL) injection  80 mg Intravenous Daily  . OxyCODONE  30 mg Oral 3 times per day  . pantoprazole  40 mg Oral Daily  . simvastatin  40 mg Oral QHS  . vancomycin  1,250 mg Intravenous Q12H   Continuous Infusions: . sodium chloride 100 mL/hr at 09/08/14 7628    Active Problems:   Non-small cell lung cancer   HCAP (healthcare-associated pneumonia)   Acute respiratory failure with hypoxia   Hypotension   Sepsis    Time spent: 25 minutes.     [facility] Pager [phone]. If 7PM-7AM, please contact night-coverage at [email or web address], password [facility] 09/08/2014, 9:31 AM  LOS: 2  days

## 2014-09-08 NOTE — Plan of Care (Addendum)
Problem: ICU Phase Progression Outcomes Goal: O2 sats trending toward baseline Outcome: Progressing Pt requiring Bipap support throughout most of shift today.  02 sats are mid to high 80's while on Non-rebreather mask. Goal: Dyspnea controlled at rest Outcome: Progressing Bipap helping with tachypnea, RR as high as 40-50's at times, when without Bipap.  Pt fearful, support provided as needed.  All nursing care explained prior to performing. Goal: Pain controlled with appropriate interventions Outcome: Progressing Oxycontin as ordered, no prn's needed.   Goal: Voiding-avoid urinary catheter unless indicated Outcome: Completed/Met Date Met:  09/08/14 Pt voids on bedpan.

## 2014-09-09 ENCOUNTER — Inpatient Hospital Stay (HOSPITAL_COMMUNITY): Payer: BC Managed Care – PPO

## 2014-09-09 LAB — GLUCOSE, CAPILLARY
GLUCOSE-CAPILLARY: 133 mg/dL — AB (ref 70–99)
GLUCOSE-CAPILLARY: 129 mg/dL — AB (ref 70–99)
Glucose-Capillary: 114 mg/dL — ABNORMAL HIGH (ref 70–99)
Glucose-Capillary: 121 mg/dL — ABNORMAL HIGH (ref 70–99)
Glucose-Capillary: 131 mg/dL — ABNORMAL HIGH (ref 70–99)

## 2014-09-09 LAB — CBC
HEMATOCRIT: 31.2 % — AB (ref 36.0–46.0)
Hemoglobin: 9.6 g/dL — ABNORMAL LOW (ref 12.0–15.0)
MCH: 27.7 pg (ref 26.0–34.0)
MCHC: 30.8 g/dL (ref 30.0–36.0)
MCV: 89.9 fL (ref 78.0–100.0)
Platelets: 319 10*3/uL (ref 150–400)
RBC: 3.47 MIL/uL — AB (ref 3.87–5.11)
RDW: 14.6 % (ref 11.5–15.5)
WBC: 11.2 10*3/uL — ABNORMAL HIGH (ref 4.0–10.5)

## 2014-09-09 LAB — BASIC METABOLIC PANEL
Anion gap: 7 (ref 5–15)
BUN: 19 mg/dL (ref 6–23)
CO2: 22 mmol/L (ref 19–32)
CREATININE: 0.52 mg/dL (ref 0.50–1.10)
Calcium: 8.4 mg/dL (ref 8.4–10.5)
Chloride: 106 mEq/L (ref 96–112)
GFR calc non Af Amer: >90 mL/min (ref >90)
Glucose, Bld: 139 mg/dL — ABNORMAL HIGH (ref 70–99)
POTASSIUM: 3.8 mmol/L (ref 3.5–5.1)
Sodium: 135 mmol/L (ref 135–145)

## 2014-09-09 LAB — VANCOMYCIN, TROUGH: Vancomycin Tr: 24.3 ug/mL — ABNORMAL HIGH (ref 10.0–20.0)

## 2014-09-09 LAB — BRAIN NATRIURETIC PEPTIDE: B Natriuretic Peptide: 227.5 pg/mL — ABNORMAL HIGH (ref 0.0–100.0)

## 2014-09-09 MED ORDER — VANCOMYCIN HCL IN DEXTROSE 750-5 MG/150ML-% IV SOLN
750.0000 mg | Freq: Two times a day (BID) | INTRAVENOUS | Status: DC
  Administered 2014-09-10 – 2014-09-12 (×5): 750 mg via INTRAVENOUS
  Filled 2014-09-09 (×6): qty 150

## 2014-09-09 NOTE — Progress Notes (Signed)
Brief pharmacy note: Vanc trough 24.3 (supratherapeutic)  Plan:  Stop current vanc dose that is infusing (spoke with RN) ~ 1/2 the dose (625mg ) infused  Decrease vanc dose to 750mg  IV q12h starting at 0600 on 12/27  Follow renal function, clinical course  Dolly Rias RPh 09/09/2014, 7:25 PM Pager [phone]

## 2014-09-09 NOTE — Progress Notes (Signed)
[facility] PROGRESS NOTE  Emily West [facility] XFG:[id number] DOB: Oct 17, 1950 DOA: 09/06/2014 PCP: [name], MD  Assessment/Plan: 1. Acute hypoxic respiratory failure: Admitted to step down, currently requiring NRB since admission. Unclear etiology probably pneumonitis from infectious process.treating her for both for health care associated pneumonia with IV antibiotics and IV steroids for inflammation.  Minimal pleural effusions on CXR.  Incentive spirometry, resuem IV antibiotics and IV steroids. Nebulizer treatments if needed. Keep sats greater than 90%. Influenza PCR is negative. BNP is 227.  PCCM consulted and recommendations given. Plan to wean her off the NRB to venti in the next 24 hours.  Currently she is full code    NSCLC with bone and brain mets: - s/p palliative radiation treatment. Chemo scheduled for 12/30. Unclear if she can tolerate chemo in this state.  Dr [name] on board .   Hypokalemia  repleted as needed  Anemia: Normocytic and at baseline.   Mild leukocytosis: Possibly from the infection.      Code Status: full code Family Communication: none at bedside Disposition Plan: remain in step down.    Consultants:  PCCM  ONCOLOGY  Procedures:  none  Antibiotics:  IV vancomycin   IV CEFEPIME 12/23  HPI/Subjective: Feeling, ok, no chest pain , no nausea or abdominal pain. No appetite. doesn t remember if she was on BIPAP last night. She is also little confused today.   Objective: Filed Vitals:   09/09/14 0600  BP: 138/53  Pulse: 79  Temp:   Resp: 27    Intake/Output Summary (Last 24 hours) at 09/09/14 0928 Last data filed at 09/09/14 0600  Gross per 24 hour  Intake    960 ml  Output    950 ml  Net     10 ml   Filed Weights   09/07/14 0400 09/08/14 0500 09/09/14 0400  Weight: 94.3 kg (207 lb 14.3 oz) 89.6 kg (197 lb 8.5 oz) 93 kg (205 lb 0.4 oz)    Exam:   General:  Alert afebrile comfortable  Cardiovascular: s1s2,  no mrg.   Respiratory: diminished air entry at bases, no wheezing or rhonchi.  Abdomen: soft non tender non distended bowel sounds heard  Musculoskeletal: no pedal edema.   Data Reviewed: Basic Metabolic Panel:  Recent Labs Lab 09/06/14 1208 09/07/14 0345 09/08/14 0650 09/09/14 0409  NA 132* 136 134* 135  K 3.5 3.6 3.4* 3.8  CL 99 109 102 106  CO2 22 20 24 22   GLUCOSE 112* 134* 101* 139*  BUN 22 15 17 19   CREATININE 1.04 0.71 0.61 0.52  CALCIUM 8.3* 7.8* 7.9* 8.4   Liver Function Tests:  Recent Labs Lab 09/06/14 1208  AST 21  ALT 12  ALKPHOS 137*  BILITOT 0.9  PROT 6.6  ALBUMIN 2.5*   No results for input(s): LIPASE, AMYLASE in the last 168 hours. No results for input(s): AMMONIA in the last 168 hours. CBC:  Recent Labs Lab 09/06/14 1208 09/07/14 0345 09/09/14 0409  WBC 12.0* 12.6* 11.2*  NEUTROABS 9.8*  --   --   HGB 9.9* 9.2* 9.6*  HCT 31.4* 29.4* 31.2*  MCV 88.5 87.8 89.9  PLT 289 227 319   Cardiac Enzymes: No results for input(s): CKTOTAL, CKMB, CKMBINDEX, TROPONINI in the last 168 hours. BNP (last 3 results)  Recent Labs  08/15/14 0425  PROBNP 114.8   CBG:  Recent Labs Lab 09/08/14 0319 09/08/14 0801 09/08/14 1254 09/08/14 1622 09/08/14 1926  GLUCAP 100* 91 111* 131* 120*  Recent Results (from the past 240 hour(s))  Blood culture (routine x 2)     Status: None (Preliminary result)   Collection Time: 09/06/14  3:32 PM  Result Value Ref Range Status   Specimen Description BLOOD LEFT FOREARM  Final   Special Requests BOTTLES DRAWN AEROBIC AND ANAEROBIC 5CC  Final   Culture  Setup Time   Final    09/06/2014 20:48 Performed at [facility]    Culture   Final           BLOOD CULTURE RECEIVED NO GROWTH TO DATE CULTURE WILL BE HELD FOR 5 DAYS BEFORE ISSUING A FINAL NEGATIVE REPORT Performed at [facility]    Report Status PENDING  Incomplete  Blood culture (routine x 2)     Status: None (Preliminary result)    Collection Time: 09/06/14  3:33 PM  Result Value Ref Range Status   Specimen Description BLOOD LEFT ANTECUBITAL  Final   Special Requests BOTTLES DRAWN AEROBIC AND ANAEROBIC 5CC  Final   Culture  Setup Time   Final    09/06/2014 20:48 Performed at [facility]    Culture   Final           BLOOD CULTURE RECEIVED NO GROWTH TO DATE CULTURE WILL BE HELD FOR 5 DAYS BEFORE ISSUING A FINAL NEGATIVE REPORT Performed at [facility]    Report Status PENDING  Incomplete  MRSA PCR Screening     Status: None   Collection Time: 09/06/14  5:59 PM  Result Value Ref Range Status   MRSA by PCR NEGATIVE NEGATIVE Final    Comment:        The GeneXpert MRSA Assay (FDA approved for NASAL specimens only), is one component of a comprehensive MRSA colonization surveillance program. It is not intended to diagnose MRSA infection nor to guide or monitor treatment for MRSA infections.      Studies: Dg Chest Port 1 View  09/09/2014   CLINICAL DATA:  Acute respiratory failure  EXAM: PORTABLE CHEST - 1 VIEW  COMPARISON:  Yesterday  FINDINGS: Diffuse bilateral heterogeneous opacities are stable. Pleural effusion at the right apex improved. Mild cardiomegaly. No pneumothorax.  IMPRESSION: Diffuse airspace disease stable.  Improved right pleural effusion.   Electronically Signed   By: Maryclare Bean M.D.   On: 09/09/2014 07:36   Dg Chest Port 1 View  09/08/2014   CLINICAL DATA:  Respiratory failure, hypoxia  EXAM: PORTABLE CHEST - 1 VIEW  COMPARISON:  09/07/2014  FINDINGS: Cardiac shadow is stable. The lungs are well aerated with diffuse bilateral opacities stable from the prior exam. Small amount of right-sided pleural fluid is noted capping along the right apex. This is stable in appearance. No new focal abnormality is seen.  IMPRESSION: No significant interval change in bilateral airspace disease when compared with the prior exam.   Electronically Signed   By: [name] M.D.   On: 09/08/2014  07:16    Scheduled Meds: . antiseptic oral rinse  7 mL Mouth Rinse q12n4p  . ARIPiprazole  2 mg Oral Daily  . ceFEPime (MAXIPIME) IV  1 g Intravenous 3 times per day  . chlorhexidine  15 mL Mouth Rinse BID  . enoxaparin (LOVENOX) injection  40 mg Subcutaneous Q24H  . ferrous sulfate  325 mg Oral BID WC  . FLUoxetine  40 mg Oral BID  . folic acid  1 mg Oral Daily  . insulin aspart  0-15 Units Subcutaneous 6 times per  day  . lactose free nutrition  237 mL Oral BID  . methylPREDNISolone (SOLU-MEDROL) injection  80 mg Intravenous Q12H  . OxyCODONE  30 mg Oral 3 times per day  . pantoprazole  40 mg Oral Daily  . simvastatin  40 mg Oral QHS  . vancomycin  1,250 mg Intravenous Q12H   Continuous Infusions: . sodium chloride 500 mL (09/08/14 2127)    Active Problems:   Non-small cell lung cancer   HCAP (healthcare-associated pneumonia)   Acute respiratory failure with hypoxia   Hypotension   Sepsis    Time spent: 25 minutes.     [facility] Pager [phone]. If 7PM-7AM, please contact night-coverage at [email or web address], password [facility] 09/09/2014, 9:28 AM  LOS: 3 days

## 2014-09-09 NOTE — Progress Notes (Signed)
ANTIBIOTIC CONSULT NOTE   Pharmacy Consult for Vancomycin/cefepime Indication: rule out pneumonia  No Known Allergies  Patient Measurements: Height: 5\' 5"  (165.1 cm) Weight: 205 lb 0.4 oz (93 kg) IBW/kg (Calculated) : 57  Vital Signs: Temp: 97.6 F (36.4 C) (12/26 1200) Temp Source: Axillary (12/26 1200) BP: 126/59 mmHg (12/26 1200) Pulse Rate: 79 (12/26 1200) Intake/Output from previous day: 12/25 0701 - 12/26 0700 In: 990 [I.V.:95.5; IV Piggyback:750] Out: 950 [Urine:950] Intake/Output from this shift: Total I/O In: 60 [I.V.:60] Out: -   Labs:  Recent Labs  09/07/14 0345 09/08/14 0650 09/09/14 0409  WBC 12.6*  --  11.2*  HGB 9.2*  --  9.6*  PLT 227  --  319  CREATININE 0.71 0.61 0.52   Estimated Creatinine Clearance: 84.3 mL/min (by C-G formula based on Cr of 0.52). No results for input(s): VANCOTROUGH, VANCOPEAK, VANCORANDOM, GENTTROUGH, GENTPEAK, GENTRANDOM, TOBRATROUGH, TOBRAPEAK, TOBRARND, AMIKACINPEAK, AMIKACINTROU, AMIKACIN in the last 72 hours.   Microbiology: Recent Results (from the past 720 hour(s))  Blood culture (routine x 2)     Status: None (Preliminary result)   Collection Time: 09/06/14  3:32 PM  Result Value Ref Range Status   Specimen Description BLOOD LEFT FOREARM  Final   Special Requests BOTTLES DRAWN AEROBIC AND ANAEROBIC 5CC  Final   Culture  Setup Time   Final    09/06/2014 20:48 Performed at [facility]    Culture   Final           BLOOD CULTURE RECEIVED NO GROWTH TO DATE CULTURE WILL BE HELD FOR 5 DAYS BEFORE ISSUING A FINAL NEGATIVE REPORT Performed at [facility]    Report Status PENDING  Incomplete  Blood culture (routine x 2)     Status: None (Preliminary result)   Collection Time: 09/06/14  3:33 PM  Result Value Ref Range Status   Specimen Description BLOOD LEFT ANTECUBITAL  Final   Special Requests BOTTLES DRAWN AEROBIC AND ANAEROBIC 5CC  Final   Culture  Setup Time   Final    09/06/2014  20:48 Performed at [facility]    Culture   Final           BLOOD CULTURE RECEIVED NO GROWTH TO DATE CULTURE WILL BE HELD FOR 5 DAYS BEFORE ISSUING A FINAL NEGATIVE REPORT Performed at [facility]    Report Status PENDING  Incomplete  MRSA PCR Screening     Status: None   Collection Time: 09/06/14  5:59 PM  Result Value Ref Range Status   MRSA by PCR NEGATIVE NEGATIVE Final    Comment:        The GeneXpert MRSA Assay (FDA approved for NASAL specimens only), is one component of a comprehensive MRSA colonization surveillance program. It is not intended to diagnose MRSA infection nor to guide or monitor treatment for MRSA infections.     Medical History: Past Medical History  Diagnosis Date  . History of suicidal ideation   . HTN (hypertension)   . HLD (hyperlipidemia)   . Shortness of breath   . Anxiety and depression   . Menopause   . Benign hematuria   . Impaired fasting glucose   . Other abnormal glucose   . Cancer      Assessment: 63 yo female presents with abrupt onset of SHOB. She was recently diagnosed with metastatic stage IV lung cancer during previous admission 08/08/14-08/22/14. She also notes pain in her right hip after felt something pop after getting out  of the car yesterday. CXR shows significant increase in bilateral infiltrates, a portion which may be related to the patient's underlying neoplastic history. CTA reveals no PE but ground glass appearance with infectious vs inflammatory etiology.  12/23 >> vancomycin >> 12/23 >> zosyn x 1 12/23 >> cefepime >>  Tmax: afeb since fever at time of admission WBC: 12.6 (unchanged) Renal: SCr improved overnight (increased from previous admit), CrCl 90N  12/23 Sputum: 12/23 Blood x 2: NGTD 12/23 MRSA: negative 12/24 influenza: negative Legionella Ag: needs to be collected S. pneumo Ag: needs to be collected  Goal of Therapy:  Vancomycin trough level 15-20 mcg/ml  Eradication of  infection Appropriate dosing of antibiotics for indication and renal function  Plan:   Continue vancomycin 1250mg  IV q12h  Per PCCM note, planning to continue vancomycin for 5 days; will check trough tonight before dose due at 1800  Continue cefepime 1gm IV q8h  F/u renal function, clinical course, cultures as available  F/u de-escalation of antibiotics and LOT  Reuel Boom, PharmD Pager: [phone] 09/09/2014, 2:38 PM

## 2014-09-09 NOTE — Progress Notes (Signed)
Name: Emily West [facility] MRN: [id number] DOB: [birth year]    ADMISSION DATE:  09/06/2014 CONSULTATION DATE:  09/06/2014  REFERRING MD :  Karleen Hampshire  CHIEF COMPLAINT:  SOB  BRIEF PATIENT DESCRIPTION: 63 year old female with recent dx stage IV NSCLC admitted 12/23 for hypoxic respiratory failure 2/2 presumed CAP. Despite high flow O2 she remained hypoxic. PCCM asked to consult. - was to start chemo on 12/30 PRIOR THERAPY:  1) Stereotactic radiotherapy to the metastatic bone lesions at T10 and L3 under the care of Dr. [name] and Dr. Vertell Limber. 2) stereotactic radiotherapy to a solitary brain lesion under the care of Dr. [name].  SIGNIFICANT EVENTS  12/25 bipap overnight  STUDIES:  Echo 12/1 > LVEF 55-60% CT chest 12/23 > bilateral GGO with bilateral pleural effusions. Pulm edema vs hypersensitivity pneumonitis. Pathologic T10 vertebral body fx.    SUBJECTIVE:Remains dyspneic, off BIPAP, on NRB, cough without mucus production Afebrile Denies pain  VITAL SIGNS: Temp:  [96.9 F (36.1 C)-98.5 F (36.9 C)] 97.5 F (36.4 C) (12/26 0800) Pulse Rate:  [64-98] 69 (12/26 0800) Resp:  [20-42] 23 (12/26 0800) BP: (101-138)/(41-70) 115/49 mmHg (12/26 0800) SpO2:  [87 %-96 %] 93 % (12/26 0900) FiO2 (%):  [60 %-100 %] 100 % (12/26 0930) Weight:  [93 kg (205 lb 0.4 oz)] 93 kg (205 lb 0.4 oz) (12/26 0400)  PHYSICAL EXAMINATION:  Gen: resting comfortably HEENT: NCAT, EOMi PULM: Crackles Bilaterally scatterd CV: RRR, no mgr AB: BS+, soft, nontender Ext: warm no edema Neuro: A&Ox4, maew   Recent Labs Lab 09/07/14 0345 09/08/14 0650 09/09/14 0409  NA 136 134* 135  K 3.6 3.4* 3.8  CL 109 102 106  CO2 20 24 22   BUN 15 17 19   CREATININE 0.71 0.61 0.52  GLUCOSE 134* 101* 139*    Recent Labs Lab 09/06/14 1208 09/07/14 0345 09/09/14 0409  HGB 9.9* 9.2* 9.6*  HCT 31.4* 29.4* 31.2*  WBC 12.0* 12.6* 11.2*  PLT 289 227 319   Dg Chest Port 1 View  09/09/2014   CLINICAL DATA:   Acute respiratory failure  EXAM: PORTABLE CHEST - 1 VIEW  COMPARISON:  Yesterday  FINDINGS: Diffuse bilateral heterogeneous opacities are stable. Pleural effusion at the right apex improved. Mild cardiomegaly. No pneumothorax.  IMPRESSION: Diffuse airspace disease stable.  Improved right pleural effusion.   Electronically Signed   By: Maryclare Bean M.D.   On: 09/09/2014 07:36   Dg Chest Port 1 View  09/08/2014   CLINICAL DATA:  Respiratory failure, hypoxia  EXAM: PORTABLE CHEST - 1 VIEW  COMPARISON:  09/07/2014  FINDINGS: Cardiac shadow is stable. The lungs are well aerated with diffuse bilateral opacities stable from the prior exam. Small amount of right-sided pleural fluid is noted capping along the right apex. This is stable in appearance. No new focal abnormality is seen.  IMPRESSION: No significant interval change in bilateral airspace disease when compared with the prior exam.   Electronically Signed   By: [name] M.D.   On: 09/08/2014 07:16    ASSESSMENT / PLAN:  PULMONARY A: Acute hypoxemic respiratory failure. 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  P:   BIPAP PRN for WOB only Titrate FiO2 to maintatin SpO2 > 90% solumedrol 80 q 12h Out of bed Incentive spirometry PRN albuterol Trend CXR & FIO2 for improvement  CARDIOVASCULAR A:  Hypotension resolved H/o HTN Chronic diastolic heart failure, doubt exacerbation  P:  Telemetry Check proBNP KVO fluids Holding home anti-hypertensive's for now (irbesartan) Continue statin  RENAL A:   No acute issues P:   Follow Bmet I&O  GASTROINTESTINAL A:   No acute issue P:   Advance diet Continue home PPI   HEMATOLOGIC A:   Anemia, chronic P:  Follow CBC  INFECTIOUS A:   Severe sepsis 2/2 HCAP > improving  P:   BCx2 12/23 >>>ng Sputum 12/23 >>> Abx: cefepime, start date 12/23>>> plan 5 days Abx: vancomycin, start date  12/23>>>12/28 (planned)  Trend WBC and fever curve, low pct reassuring  ENDOCRINE A:   No acute issues    P:   Follow glucose on chemistries  NEUROLOGIC A:   Anxiety Depression  P:   RASS goal: 0 Hold sedating meds for now  (xanax, fluoxetine, oxycodone)   FAMILY  - Updates: husband updated by phone  - Inter-disciplinary family meet or Palliative Care meeting due by:  12/30  Code: full for now, await her decision about advance directives Unclear cause of pneumonitis -treat for infection & inflammation  Rigoberto Noel MD

## 2014-09-10 ENCOUNTER — Inpatient Hospital Stay (HOSPITAL_COMMUNITY): Payer: BC Managed Care – PPO

## 2014-09-10 LAB — BASIC METABOLIC PANEL
Anion gap: 3 — ABNORMAL LOW (ref 5–15)
BUN: 25 mg/dL — ABNORMAL HIGH (ref 6–23)
CO2: 27 mmol/L (ref 19–32)
Calcium: 8.7 mg/dL (ref 8.4–10.5)
Chloride: 110 mEq/L (ref 96–112)
Creatinine, Ser: 0.47 mg/dL — ABNORMAL LOW (ref 0.50–1.10)
GFR calc non Af Amer: >90 mL/min (ref >90)
Glucose, Bld: 151 mg/dL — ABNORMAL HIGH (ref 70–99)
POTASSIUM: 4 mmol/L (ref 3.5–5.1)
Sodium: 140 mmol/L (ref 135–145)

## 2014-09-10 LAB — GLUCOSE, CAPILLARY
GLUCOSE-CAPILLARY: 124 mg/dL — AB (ref 70–99)
GLUCOSE-CAPILLARY: 138 mg/dL — AB (ref 70–99)
GLUCOSE-CAPILLARY: 144 mg/dL — AB (ref 70–99)
GLUCOSE-CAPILLARY: 182 mg/dL — AB (ref 70–99)
Glucose-Capillary: 133 mg/dL — ABNORMAL HIGH (ref 70–99)
Glucose-Capillary: 137 mg/dL — ABNORMAL HIGH (ref 70–99)
Glucose-Capillary: 126 mg/dL — ABNORMAL HIGH (ref 70–99)
Glucose-Capillary: 121 mg/dL — ABNORMAL HIGH (ref 70–99)

## 2014-09-10 LAB — CBC
HCT: 29.1 % — ABNORMAL LOW (ref 36.0–46.0)
Hemoglobin: 9.2 g/dL — ABNORMAL LOW (ref 12.0–15.0)
MCH: 28 pg (ref 26.0–34.0)
MCHC: 31.6 g/dL (ref 30.0–36.0)
MCV: 88.4 fL (ref 78.0–100.0)
Platelets: 315 10*3/uL (ref 150–400)
RBC: 3.29 MIL/uL — AB (ref 3.87–5.11)
RDW: 14.2 % (ref 11.5–15.5)
WBC: 12 10*3/uL — ABNORMAL HIGH (ref 4.0–10.5)

## 2014-09-10 LAB — EXPECTORATED SPUTUM ASSESSMENT W REFEX TO RESP CULTURE

## 2014-09-10 LAB — STREP PNEUMONIAE URINARY ANTIGEN: Strep Pneumo Urinary Antigen: NEGATIVE

## 2014-09-10 LAB — EXPECTORATED SPUTUM ASSESSMENT W GRAM STAIN, RFLX TO RESP C

## 2014-09-10 MED ORDER — POLYETHYLENE GLYCOL 3350 17 G PO PACK
17.0000 g | PACK | Freq: Every day | ORAL | Status: DC
  Administered 2014-09-11 – 2014-09-21 (×4): 17 g via ORAL
  Filled 2014-09-10 (×9): qty 1

## 2014-09-10 MED ORDER — SENNOSIDES-DOCUSATE SODIUM 8.6-50 MG PO TABS
2.0000 | ORAL_TABLET | Freq: Two times a day (BID) | ORAL | Status: DC
  Administered 2014-09-10 – 2014-09-21 (×14): 2 via ORAL
  Filled 2014-09-10 (×20): qty 2

## 2014-09-10 MED ORDER — FUROSEMIDE 10 MG/ML IJ SOLN
40.0000 mg | Freq: Two times a day (BID) | INTRAMUSCULAR | Status: DC
  Administered 2014-09-10 (×2): 40 mg via INTRAVENOUS
  Filled 2014-09-10 (×2): qty 4

## 2014-09-10 NOTE — Progress Notes (Signed)
Name: Miamor Ayler [facility] MRN: [id number] DOB: Feb 13, 1951    ADMISSION DATE:  09/06/2014 CONSULTATION DATE:  09/06/2014  REFERRING MD :  Karleen Hampshire  CHIEF COMPLAINT:  SOB  BRIEF PATIENT DESCRIPTION: 63 year old female with recent dx stage IV NSCLC admitted 12/23 for hypoxic respiratory failure 2/2 presumed CAP. Despite high flow O2 she remained hypoxic. PCCM asked to consult. - was to start chemo on 12/30 PRIOR THERAPY:  1) Stereotactic radiotherapy to the metastatic bone lesions at T10 and L3 under Dr. [name] and Dr. Vertell Limber. 2) stereotactic radiotherapy to a solitary brain lesion under the care of Dr. [name].  SIGNIFICANT EVENTS  12/25 bipap overnight  STUDIES:  Echo 12/1 > LVEF 55-60% CT chest 12/23 > bilateral GGO with bilateral pleural effusions. Pulm edema vs hypersensitivity pneumonitis. Pathologic T10 vertebral body fx.    SUBJECTIVE:Remains dyspneic, increased WOB off BIPAP, hence back on Afebrile Denies pain  VITAL SIGNS: Temp:  [96.5 F (35.8 C)-97.6 F (36.4 C)] 97.5 F (36.4 C) (12/27 0800) Pulse Rate:  [69-89] 74 (12/27 0841) Resp:  [19-35] 29 (12/27 0841) BP: (78-127)/(43-70) 122/63 mmHg (12/27 0841) SpO2:  [84 %-99 %] 93 % (12/27 0841) FiO2 (%):  [60 %-100 %] 60 % (12/27 0841) Weight:  [94.7 kg (208 lb 12.4 oz)] 94.7 kg (208 lb 12.4 oz) (12/27 0400)  PHYSICAL EXAMINATION:  Gen: resting comfortably HEENT: NCAT, EOMi PULM: Crackles Bilaterally scatterd CV: RRR, no mgr AB: BS+, soft, nontender Ext: warm no edema Neuro: A&Ox4, maew   Recent Labs Lab 09/08/14 0650 09/09/14 0409 09/10/14 0359  NA 134* 135 140  K 3.4* 3.8 4.0  CL 102 106 110  CO2 24 22 27   BUN 17 19 25*  CREATININE 0.61 0.52 0.47*  GLUCOSE 101* 139* 151*    Recent Labs Lab 09/07/14 0345 09/09/14 0409 09/10/14 0359  HGB 9.2* 9.6* 9.2*  HCT 29.4* 31.2* 29.1*  WBC 12.6* 11.2* 12.0*  PLT 227 319 315   Dg Chest Port 1 View  09/09/2014   CLINICAL DATA:  Acute respiratory  failure  EXAM: PORTABLE CHEST - 1 VIEW  COMPARISON:  Yesterday  FINDINGS: Diffuse bilateral heterogeneous opacities are stable. Pleural effusion at the right apex improved. Mild cardiomegaly. No pneumothorax.  IMPRESSION: Diffuse airspace disease stable.  Improved right pleural effusion.   Electronically Signed   By: Maryclare Bean M.D.   On: 09/09/2014 07:36    ASSESSMENT / PLAN:  PULMONARY A: Acute hypoxemic respiratory failure. 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  P:   BIPAP  Titrate FiO2 to maintatin SpO2 > 90% solumedrol 80 q 8h Incentive spirometry PRN albuterol Trend CXR & FIO2 for improvement High risk intubation -Obtain BAL cytology if intubated  CARDIOVASCULAR A:  Hypotension resolved H/o HTN Chronic diastolic heart failure, doubt exacerbation, low BNP P:  Telemetry Holding home anti-hypertensive's for now (irbesartan) Continue statin  RENAL A:   No acute issues P:   Lasix x 1  GASTROINTESTINAL A:   No acute issue P:   Advance diet Continue home PPI   HEMATOLOGIC A:   Anemia, chronic P:  Follow CBC  INFECTIOUS A:   Severe sepsis 2/2 HCAP > improving  P:   BCx2 12/23 >>>ng Sputum 12/23 >>> Abx: cefepime, start date 12/23>>>  Abx: vancomycin, start date 12/23>>>12/28 (planned)  Trend WBC and fever curve, low pct reassuring  ENDOCRINE A:   No  acute issues    P:   Follow glucose on chemistries  NEUROLOGIC A:   Anxiety Depression  P:   RASS goal: 0 Hold sedating meds for now  (xanax, fluoxetine, oxycodone)   FAMILY  - Updates: husband updated by phone  - Inter-disciplinary family meet or Palliative Care meeting due by:  12/30  Code: full for now, she does want intubation if needed, involve palliative care Unclear cause of pneumonitis -treating for infection & inflammation -but this may very well be lymphangitic  spread of cancer  The patient is critically ill with multiple organ systems failure and requires high complexity decision making for assessment and support, frequent evaluation and titration of therapies, application of advanced monitoring technologies and extensive interpretation of multiple databases. Critical Care Time devoted to patient care services described in this note independent of APP time is 31 minutes.   Kara Mead MD. Shade Flood. [facility] Pager [phone] If no response call [place]   Rigoberto Noel MD

## 2014-09-10 NOTE — Progress Notes (Signed)
[facility] PROGRESS NOTE  Emily West [facility] MBW:[id number] DOB: 09/09/1951 DOA: 09/06/2014 PCP: [name], MD  Assessment/Plan: 1. Acute hypoxic respiratory failure: Admitted to step down, currently requiring NRB since admission. Unclear etiology probably pneumonitis from infectious process.treating her for both for health care associated pneumonia with IV antibiotics and IV steroids for inflammation.  Minimal pleural effusions and worsened opacities on CXR.  Incentive spirometry, resuem IV antibiotics and IV steroids. Nebulizer treatments if needed. Keep sats greater than 90%. Influenza PCR is negative. BNP is 227.  PCCM consulted and recommendations given. Couldn't wean her off the NRB today.  Currently she is full code    NSCLC with bone and brain mets: - s/p palliative radiation treatment. Chemo scheduled for 12/30. Unclear if she can tolerate chemo in this state.  Dr [name] on board .   Hypokalemia  repleted as needed  Anemia: Normocytic and at baseline.   Mild leukocytosis: Possibly from the infection.      Code Status: full code Family Communication:husband at bedside Disposition Plan: remain in step down.    Consultants:  PCCM  ONCOLOGY  Procedures:  none  Antibiotics:  IV vancomycin   IV CEFEPIME 12/23  HPI/Subjective: She required BIPAP all night and couldn't be weaned off the bipap this am.   Objective: Filed Vitals:   09/10/14 1600  BP: 145/72  Pulse: 89  Temp:   Resp: 29    Intake/Output Summary (Last 24 hours) at 09/10/14 1713 Last data filed at 09/10/14 1610  Gross per 24 hour  Intake    380 ml  Output   2600 ml  Net  -2220 ml   Filed Weights   09/08/14 0500 09/09/14 0400 09/10/14 0400  Weight: 89.6 kg (197 lb 8.5 oz) 93 kg (205 lb 0.4 oz) 94.7 kg (208 lb 12.4 oz)    Exam:   General:  Alert afebrile in mild address.   Cardiovascular: s1s2, no mrg.   Respiratory: diminished air entry at bases, no wheezing or  rhonchi.  Abdomen: soft non tender non distended bowel sounds heard  Musculoskeletal: no pedal edema.   Data Reviewed: Basic Metabolic Panel:  Recent Labs Lab 09/06/14 1208 09/07/14 0345 09/08/14 0650 09/09/14 0409 09/10/14 0359  NA 132* 136 134* 135 140  K 3.5 3.6 3.4* 3.8 4.0  CL 99 109 102 106 110  CO2 22 20 24 22 27   GLUCOSE 112* 134* 101* 139* 151*  BUN 22 15 17 19  25*  CREATININE 1.04 0.71 0.61 0.52 0.47*  CALCIUM 8.3* 7.8* 7.9* 8.4 8.7   Liver Function Tests:  Recent Labs Lab 09/06/14 1208  AST 21  ALT 12  ALKPHOS 137*  BILITOT 0.9  PROT 6.6  ALBUMIN 2.5*   No results for input(s): LIPASE, AMYLASE in the last 168 hours. No results for input(s): AMMONIA in the last 168 hours. CBC:  Recent Labs Lab 09/06/14 1208 09/07/14 0345 09/09/14 0409 09/10/14 0359  WBC 12.0* 12.6* 11.2* 12.0*  NEUTROABS 9.8*  --   --   --   HGB 9.9* 9.2* 9.6* 9.2*  HCT 31.4* 29.4* 31.2* 29.1*  MCV 88.5 87.8 89.9 88.4  PLT 289 227 319 315   Cardiac Enzymes: No results for input(s): CKTOTAL, CKMB, CKMBINDEX, TROPONINI in the last 168 hours. BNP (last 3 results)  Recent Labs  08/15/14 0425  PROBNP 114.8   CBG:  Recent Labs Lab 09/09/14 2027 09/09/14 2343 09/10/14 0425 09/10/14 0801 09/10/14 1150  GLUCAP 124* 138* 144* 137* 126*  Recent Results (from the past 240 hour(s))  Blood culture (routine x 2)     Status: None (Preliminary result)   Collection Time: 09/06/14  3:32 PM  Result Value Ref Range Status   Specimen Description BLOOD LEFT FOREARM  Final   Special Requests BOTTLES DRAWN AEROBIC AND ANAEROBIC 5CC  Final   Culture  Setup Time   Final    09/06/2014 20:48 Performed at [facility]    Culture   Final           BLOOD CULTURE RECEIVED NO GROWTH TO DATE CULTURE WILL BE HELD FOR 5 DAYS BEFORE ISSUING A FINAL NEGATIVE REPORT Performed at [facility]    Report Status PENDING  Incomplete  Blood culture (routine x 2)     Status: None  (Preliminary result)   Collection Time: 09/06/14  3:33 PM  Result Value Ref Range Status   Specimen Description BLOOD LEFT ANTECUBITAL  Final   Special Requests BOTTLES DRAWN AEROBIC AND ANAEROBIC 5CC  Final   Culture  Setup Time   Final    09/06/2014 20:48 Performed at [facility]    Culture   Final           BLOOD CULTURE RECEIVED NO GROWTH TO DATE CULTURE WILL BE HELD FOR 5 DAYS BEFORE ISSUING A FINAL NEGATIVE REPORT Performed at [facility]    Report Status PENDING  Incomplete  MRSA PCR Screening     Status: None   Collection Time: 09/06/14  5:59 PM  Result Value Ref Range Status   MRSA by PCR NEGATIVE NEGATIVE Final    Comment:        The GeneXpert MRSA Assay (FDA approved for NASAL specimens only), is one component of a comprehensive MRSA colonization surveillance program. It is not intended to diagnose MRSA infection nor to guide or monitor treatment for MRSA infections.   Culture, sputum-assessment     Status: None   Collection Time: 09/10/14  2:24 PM  Result Value Ref Range Status   Specimen Description SPUTUM  Final   Special Requests NONE  Final   Sputum evaluation   Final    THIS SPECIMEN IS ACCEPTABLE. RESPIRATORY CULTURE REPORT TO FOLLOW.   Report Status 09/10/2014 FINAL  Final     Studies: Dg Chest Port 1 View  09/10/2014   CLINICAL DATA:  Acute respiratory failure.  Lung cancer.  EXAM: PORTABLE CHEST - 1 VIEW  COMPARISON:  09/09/2014  FINDINGS: Lungs are adequately inflated and demonstrate continued bilateral diffuse mixed interstitial airspace process without significant change. More focal consolidation over the right base slightly worse. Possible small left effusion. Cardiomediastinal silhouette and remainder of the exam is unchanged.  IMPRESSION: Stable diffuse bilateral mixed interstitial airspace process with slight worsening more focal airspace density in the right base. Findings are likely due to infection. Possible small left  effusion.   Electronically Signed   By: Marin Olp M.D.   On: 09/10/2014 10:23   Dg Chest Port 1 View  09/09/2014   CLINICAL DATA:  Acute respiratory failure  EXAM: PORTABLE CHEST - 1 VIEW  COMPARISON:  Yesterday  FINDINGS: Diffuse bilateral heterogeneous opacities are stable. Pleural effusion at the right apex improved. Mild cardiomegaly. No pneumothorax.  IMPRESSION: Diffuse airspace disease stable.  Improved right pleural effusion.   Electronically Signed   By: Maryclare Bean M.D.   On: 09/09/2014 07:36    Scheduled Meds: . antiseptic oral rinse  7 mL Mouth Rinse q12n4p  .  ARIPiprazole  2 mg Oral Daily  . ceFEPime (MAXIPIME) IV  1 g Intravenous 3 times per day  . chlorhexidine  15 mL Mouth Rinse BID  . enoxaparin (LOVENOX) injection  40 mg Subcutaneous Q24H  . ferrous sulfate  325 mg Oral BID WC  . FLUoxetine  40 mg Oral BID  . folic acid  1 mg Oral Daily  . furosemide  40 mg Intravenous Q12H  . insulin aspart  0-15 Units Subcutaneous 6 times per day  . lactose free nutrition  237 mL Oral BID  . methylPREDNISolone (SOLU-MEDROL) injection  80 mg Intravenous Q12H  . OxyCODONE  30 mg Oral 3 times per day  . pantoprazole  40 mg Oral Daily  . polyethylene glycol  17 g Oral Daily  . senna-docusate  2 tablet Oral BID  . simvastatin  40 mg Oral QHS  . vancomycin  750 mg Intravenous Q12H   Continuous Infusions: . sodium chloride 10 mL/hr at 09/10/14 0700    Active Problems:   Non-small cell lung cancer   HCAP (healthcare-associated pneumonia)   Acute respiratory failure with hypoxia   Hypotension   Sepsis    Time spent: 25 minutes.     [facility] Pager [phone]. If 7PM-7AM, please contact night-coverage at [email or web address], password [facility] 09/10/2014, 5:13 PM  LOS: 4 days

## 2014-09-10 NOTE — Progress Notes (Signed)
Pt currently off BIPAP and on 100% NRB at this time, Pt tolerating well at this time, pt in no apparent distress and has no increased WOB.  RT to hold BIPAP for now.  RT to monitor and assess as needed.

## 2014-09-10 NOTE — Progress Notes (Signed)
Pt remains on 100% NRB at this time.

## 2014-09-11 ENCOUNTER — Inpatient Hospital Stay: Payer: Self-pay | Admitting: Internal Medicine

## 2014-09-11 ENCOUNTER — Other Ambulatory Visit: Payer: Self-pay

## 2014-09-11 ENCOUNTER — Inpatient Hospital Stay (HOSPITAL_COMMUNITY): Payer: BC Managed Care – PPO

## 2014-09-11 ENCOUNTER — Ambulatory Visit: Payer: Self-pay

## 2014-09-11 ENCOUNTER — Ambulatory Visit: Payer: Self-pay | Admitting: Internal Medicine

## 2014-09-11 LAB — BASIC METABOLIC PANEL
ANION GAP: 7 (ref 5–15)
BUN: 27 mg/dL — ABNORMAL HIGH (ref 6–23)
CO2: 31 mmol/L (ref 19–32)
CREATININE: 0.56 mg/dL (ref 0.50–1.10)
Calcium: 8.4 mg/dL (ref 8.4–10.5)
Chloride: 101 mEq/L (ref 96–112)
GFR calc Af Amer: >90 mL/min (ref >90)
Glucose, Bld: 145 mg/dL — ABNORMAL HIGH (ref 70–99)
Potassium: 3.2 mmol/L — ABNORMAL LOW (ref 3.5–5.1)
Sodium: 139 mmol/L (ref 135–145)

## 2014-09-11 LAB — CBC
HEMATOCRIT: 32.2 % — AB (ref 36.0–46.0)
Hemoglobin: 10.2 g/dL — ABNORMAL LOW (ref 12.0–15.0)
MCH: 27.4 pg (ref 26.0–34.0)
MCHC: 31.7 g/dL (ref 30.0–36.0)
MCV: 86.6 fL (ref 78.0–100.0)
Platelets: 322 10*3/uL (ref 150–400)
RBC: 3.72 MIL/uL — ABNORMAL LOW (ref 3.87–5.11)
RDW: 14 % (ref 11.5–15.5)
WBC: 12.7 10*3/uL — ABNORMAL HIGH (ref 4.0–10.5)

## 2014-09-11 LAB — GLUCOSE, CAPILLARY
Glucose-Capillary: 105 mg/dL — ABNORMAL HIGH (ref 70–99)
Glucose-Capillary: 128 mg/dL — ABNORMAL HIGH (ref 70–99)
Glucose-Capillary: 130 mg/dL — ABNORMAL HIGH (ref 70–99)
Glucose-Capillary: 138 mg/dL — ABNORMAL HIGH (ref 70–99)
Glucose-Capillary: 142 mg/dL — ABNORMAL HIGH (ref 70–99)
Glucose-Capillary: 144 mg/dL — ABNORMAL HIGH (ref 70–99)
Glucose-Capillary: 148 mg/dL — ABNORMAL HIGH (ref 70–99)

## 2014-09-11 MED ORDER — POTASSIUM CHLORIDE CRYS ER 20 MEQ PO TBCR: 40.0000 meq | EXTENDED_RELEASE_TABLET | Freq: Two times a day (BID) | ORAL | Status: DC

## 2014-09-11 MED ORDER — FUROSEMIDE 10 MG/ML IJ SOLN
40.0000 mg | Freq: Three times a day (TID) | INTRAMUSCULAR | Status: AC
  Administered 2014-09-11 (×2): 40 mg via INTRAVENOUS
  Filled 2014-09-11 (×2): qty 4

## 2014-09-11 MED ORDER — POTASSIUM CHLORIDE 10 MEQ/100ML IV SOLN
10.0000 meq | INTRAVENOUS | Status: AC
  Administered 2014-09-11: 10 meq via INTRAVENOUS
  Filled 2014-09-11: qty 100

## 2014-09-11 MED ORDER — POTASSIUM CHLORIDE CRYS ER 20 MEQ PO TBCR
40.0000 meq | EXTENDED_RELEASE_TABLET | Freq: Three times a day (TID) | ORAL | Status: AC
  Administered 2014-09-11 (×2): 40 meq via ORAL
  Filled 2014-09-11 (×2): qty 2

## 2014-09-11 NOTE — Progress Notes (Signed)
[facility] PROGRESS NOTE  [facility] TOI:[id number] DOB: 1951-04-17 DOA: 09/06/2014 PCP: [name], MD  Assessment/Plan: 1. Acute hypoxic respiratory failure: Admitted to step down, currently requiring NRB since admission. Unclear etiology probably pneumonitis from infectious process.treating her for both for health care associated pneumonia with IV antibiotics and IV steroids for inflammation.  Minimal pleural effusions and worsened opacities on CXR. Started her on IV lasix on 12/27, plan to give her another dose today.  Incentive spirometry,  Nebulizer treatments if needed. Keep sats greater than 90%. Influenza PCR is negative. BNP is 227.  PCCM consulted and recommendations given.  Currently she is full code    NSCLC with bone and brain mets: - s/p palliative radiation treatment. Chemo scheduled for 12/30. Unclear if she can tolerate chemo in this state.  Dr [name] on board .   Hypokalemia  repleted as needed  Anemia: Normocytic and at baseline.   Mild leukocytosis: Possibly from the infection.      Code Status: full code Family Communication:husband at bedside Disposition Plan: remain in step down.    Consultants:  PCCM  ONCOLOGY  Procedures:  none  Antibiotics:  IV vancomycin   IV CEFEPIME 12/23  HPI/Subjective: On NRB all night and plan to wean to 50 % later today.  Objective: Filed Vitals:   09/11/14 1200  BP: 146/72  Pulse: 76  Temp: 97.2 F (36.2 C)  Resp: 25    Intake/Output Summary (Last 24 hours) at 09/11/14 1325 Last data filed at 09/11/14 1200  Gross per 24 hour  Intake    630 ml  Output   4075 ml  Net  -3445 ml   Filed Weights   09/08/14 0500 09/09/14 0400 09/10/14 0400  Weight: 89.6 kg (197 lb 8.5 oz) 93 kg (205 lb 0.4 oz) 94.7 kg (208 lb 12.4 oz)    Exam:   General:  Alert afebrile in mild address.   Cardiovascular: s1s2, no mrg.   Respiratory: diminished air entry at bases, no wheezing or  rhonchi.  Abdomen: soft non tender non distended bowel sounds heard  Musculoskeletal: no pedal edema.   Data Reviewed: Basic Metabolic Panel:  Recent Labs Lab 09/07/14 0345 09/08/14 0650 09/09/14 0409 09/10/14 0359 09/11/14 0352  NA 136 134* 135 140 139  K 3.6 3.4* 3.8 4.0 3.2*  CL 109 102 106 110 101  CO2 20 24 22 27 31   GLUCOSE 134* 101* 139* 151* 145*  BUN 15 17 19  25* 27*  CREATININE 0.71 0.61 0.52 0.47* 0.56  CALCIUM 7.8* 7.9* 8.4 8.7 8.4   Liver Function Tests:  Recent Labs Lab 09/06/14 1208  AST 21  ALT 12  ALKPHOS 137*  BILITOT 0.9  PROT 6.6  ALBUMIN 2.5*   No results for input(s): LIPASE, AMYLASE in the last 168 hours. No results for input(s): AMMONIA in the last 168 hours. CBC:  Recent Labs Lab 09/06/14 1208 09/07/14 0345 09/09/14 0409 09/10/14 0359 09/11/14 0352  WBC 12.0* 12.6* 11.2* 12.0* 12.7*  NEUTROABS 9.8*  --   --   --   --   HGB 9.9* 9.2* 9.6* 9.2* 10.2*  HCT 31.4* 29.4* 31.2* 29.1* 32.2*  MCV 88.5 87.8 89.9 88.4 86.6  PLT 289 227 319 315 322   Cardiac Enzymes: No results for input(s): CKTOTAL, CKMB, CKMBINDEX, TROPONINI in the last 168 hours. BNP (last 3 results)  Recent Labs  08/15/14 0425  PROBNP 114.8   CBG:  Recent Labs Lab 09/10/14 2018 09/11/14 0017 09/11/14 0402  09/11/14 0728 09/11/14 1129  GLUCAP 182* 130* 142* 144* 128*    Recent Results (from the past 240 hour(s))  Blood culture (routine x 2)     Status: None (Preliminary result)   Collection Time: 09/06/14  3:32 PM  Result Value Ref Range Status   Specimen Description BLOOD LEFT FOREARM  Final   Special Requests BOTTLES DRAWN AEROBIC AND ANAEROBIC 5CC  Final   Culture  Setup Time   Final    09/06/2014 20:48 Performed at [facility]    Culture   Final           BLOOD CULTURE RECEIVED NO GROWTH TO DATE CULTURE WILL BE HELD FOR 5 DAYS BEFORE ISSUING A FINAL NEGATIVE REPORT Performed at [facility]    Report Status PENDING   Incomplete  Blood culture (routine x 2)     Status: None (Preliminary result)   Collection Time: 09/06/14  3:33 PM  Result Value Ref Range Status   Specimen Description BLOOD LEFT ANTECUBITAL  Final   Special Requests BOTTLES DRAWN AEROBIC AND ANAEROBIC 5CC  Final   Culture  Setup Time   Final    09/06/2014 20:48 Performed at [facility]    Culture   Final           BLOOD CULTURE RECEIVED NO GROWTH TO DATE CULTURE WILL BE HELD FOR 5 DAYS BEFORE ISSUING A FINAL NEGATIVE REPORT Performed at [facility]    Report Status PENDING  Incomplete  MRSA PCR Screening     Status: None   Collection Time: 09/06/14  5:59 PM  Result Value Ref Range Status   MRSA by PCR NEGATIVE NEGATIVE Final    Comment:        The GeneXpert MRSA Assay (FDA approved for NASAL specimens only), is one component of a comprehensive MRSA colonization surveillance program. It is not intended to diagnose MRSA infection nor to guide or monitor treatment for MRSA infections.   Culture, sputum-assessment     Status: None   Collection Time: 09/10/14  2:24 PM  Result Value Ref Range Status   Specimen Description SPUTUM  Final   Special Requests NONE  Final   Sputum evaluation   Final    THIS SPECIMEN IS ACCEPTABLE. RESPIRATORY CULTURE REPORT TO FOLLOW.   Report Status 09/10/2014 FINAL  Final  Culture, respiratory (NON-Expectorated)     Status: None (Preliminary result)   Collection Time: 09/10/14  2:24 PM  Result Value Ref Range Status   Specimen Description SPUTUM  Final   Special Requests NONE  Final   Gram Stain PENDING  Incomplete   Culture NO GROWTH Performed at [facility]   Final   Report Status PENDING  Incomplete     Studies: Dg Chest Port 1 View  09/11/2014   CLINICAL DATA:  Respiratory distress.  EXAM: PORTABLE CHEST - 1 VIEW  COMPARISON:  09/10/2014.  FINDINGS: Mediastinum and hilar structures are normal. Heart size is stable. Diffuse severe bilateral interstitial  infiltrates are present. Bibasilar subsegmental atelectasis. No pleural effusion or pneumothorax. No acute osseus abnormality .  IMPRESSION: 1. Persistent severe bilateral interstitial infiltrates without significant change from prior exam.  2.  Bibasilar atelectasis.   Electronically Signed   By: Marcello Moores  Register   On: 09/11/2014 07:05   Dg Chest Port 1 View  09/10/2014   CLINICAL DATA:  Acute respiratory failure.  Lung cancer.  EXAM: PORTABLE CHEST - 1 VIEW  COMPARISON:  09/09/2014  FINDINGS:  Lungs are adequately inflated and demonstrate continued bilateral diffuse mixed interstitial airspace process without significant change. More focal consolidation over the right base slightly worse. Possible small left effusion. Cardiomediastinal silhouette and remainder of the exam is unchanged.  IMPRESSION: Stable diffuse bilateral mixed interstitial airspace process with slight worsening more focal airspace density in the right base. Findings are likely due to infection. Possible small left effusion.   Electronically Signed   By: Marin Olp M.D.   On: 09/10/2014 10:23    Scheduled Meds: . antiseptic oral rinse  7 mL Mouth Rinse q12n4p  . ARIPiprazole  2 mg Oral Daily  . ceFEPime (MAXIPIME) IV  1 g Intravenous 3 times per day  . chlorhexidine  15 mL Mouth Rinse BID  . enoxaparin (LOVENOX) injection  40 mg Subcutaneous Q24H  . ferrous sulfate  325 mg Oral BID WC  . FLUoxetine  40 mg Oral BID  . folic acid  1 mg Oral Daily  . furosemide  40 mg Intravenous Q8H  . insulin aspart  0-15 Units Subcutaneous 6 times per day  . lactose free nutrition  237 mL Oral BID  . methylPREDNISolone (SOLU-MEDROL) injection  80 mg Intravenous Q12H  . OxyCODONE  30 mg Oral 3 times per day  . pantoprazole  40 mg Oral Daily  . polyethylene glycol  17 g Oral Daily  . potassium chloride  40 mEq Oral TID  . senna-docusate  2 tablet Oral BID  . simvastatin  40 mg Oral QHS  . vancomycin  750 mg Intravenous Q12H    Continuous Infusions: . sodium chloride 10 mL/hr at 09/10/14 0700    Active Problems:   Non-small cell lung cancer   HCAP (healthcare-associated pneumonia)   Acute respiratory failure with hypoxia   Hypotension   Sepsis    Time spent: 25 minutes.     [facility] Pager [phone]. If 7PM-7AM, please contact night-coverage at [email or web address], password [email or web address] 09/11/2014, 1:25 PM  LOS: 5 days

## 2014-09-11 NOTE — Progress Notes (Signed)
Name: Emily West [facility] MRN: [id number] DOB: September 25, 1950    ADMISSION DATE:  09/06/2014 CONSULTATION DATE:  09/06/2014  REFERRING MD :  Karleen Hampshire  CHIEF COMPLAINT:  SOB  BRIEF PATIENT DESCRIPTION: 63 year old female with recent dx stage IV NSCLC admitted 12/23 for hypoxic respiratory failure 2/2 presumed CAP. Despite high flow O2 she remained hypoxic. PCCM asked to consult. - was to start chemo on 12/30 PRIOR THERAPY:  1) Stereotactic radiotherapy to the metastatic bone lesions at T10 and L3 under Dr. [name] and Dr. Vertell Limber. 2) stereotactic radiotherapy to a solitary brain lesion under the care of Dr. [name].  SIGNIFICANT EVENTS  12/25 bipap overnight  STUDIES:  Echo 12/1 > LVEF 55-60% CT chest 12/23 > bilateral GGO with bilateral pleural effusions. Pulm edema vs hypersensitivity pneumonitis. Pathologic T10 vertebral body fx.    SUBJECTIVE: Remains dyspneic, increased WOB off BIPAP and restarted. Afebrile Denies pain  VITAL SIGNS: Temp:  [97.3 F (36.3 C)-97.9 F (36.6 C)] 97.3 F (36.3 C) (12/28 0800) Pulse Rate:  [73-89] 74 (12/28 0600) Resp:  [15-38] 28 (12/28 0600) BP: (130-158)/(53-82) 133/53 mmHg (12/28 0600) SpO2:  [87 %-98 %] 92 % (12/28 0600) FiO2 (%):  [60 %-100 %] 100 % (12/28 0600)  PHYSICAL EXAMINATION:  Gen: resting comfortably on BiPAP HEENT: NCAT, EOMi PULM: Crackles Bilaterally scatterd CV: RRR, no mgr AB: BS+, soft, nontender Ext: warm no edema Neuro: A&Ox4, maew  Recent Labs Lab 09/09/14 0409 09/10/14 0359 09/11/14 0352  NA 135 140 139  K 3.8 4.0 3.2*  CL 106 110 101  CO2 22 27 31   BUN 19 25* 27*  CREATININE 0.52 0.47* 0.56  GLUCOSE 139* 151* 145*   Recent Labs Lab 09/09/14 0409 09/10/14 0359 09/11/14 0352  HGB 9.6* 9.2* 10.2*  HCT 31.2* 29.1* 32.2*  WBC 11.2* 12.0* 12.7*  PLT 319 315 322   Dg Chest Port 1 View  09/11/2014   CLINICAL DATA:  Respiratory distress.  EXAM: PORTABLE CHEST - 1 VIEW  COMPARISON:  09/10/2014.   FINDINGS: Mediastinum and hilar structures are normal. Heart size is stable. Diffuse severe bilateral interstitial infiltrates are present. Bibasilar subsegmental atelectasis. No pleural effusion or pneumothorax. No acute osseus abnormality .  IMPRESSION: 1. Persistent severe bilateral interstitial infiltrates without significant change from prior exam.  2.  Bibasilar atelectasis.   Electronically Signed   By: Marcello Moores  Register   On: 09/11/2014 07:05   Dg Chest Port 1 View  09/10/2014   CLINICAL DATA:  Acute respiratory failure.  Lung cancer.  EXAM: PORTABLE CHEST - 1 VIEW  COMPARISON:  09/09/2014  FINDINGS: Lungs are adequately inflated and demonstrate continued bilateral diffuse mixed interstitial airspace process without significant change. More focal consolidation over the right base slightly worse. Possible small left effusion. Cardiomediastinal silhouette and remainder of the exam is unchanged.  IMPRESSION: Stable diffuse bilateral mixed interstitial airspace process with slight worsening more focal airspace density in the right base. Findings are likely due to infection. Possible small left effusion.   Electronically Signed   By: Marin Olp M.D.   On: 09/10/2014 10:23    ASSESSMENT / PLAN:  PULMONARY A: Acute hypoxemic respiratory failure. 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 - Need to discuss code status given advanced cancer and advanced lung disease, patient does not want long term  support with ventilator but short term only.  Would like to speak with Dr. Inda Merlin which is reasonable.  Will continue to discuss that for now. - Titrate FiO2 to maintatin SpO2 > 90%. - Solumedrol 80 q 8h. - Incentive spirometry. - PRN albuterol. - Trend CXR & FIO2 for improvement. - High risk intubation -Obtain BAL  cytology if intubated.  CARDIOVASCULAR A:  Hypotension resolved H/o HTN Chronic diastolic heart failure, doubt exacerbation, low BNP P:  - Telemetry. - Holding home anti-hypertensive's for now (irbesartan). - Continue statin.  RENAL A:   No acute issues P:   - Lasix 40 mg IV q8 x2 doses. - Replace electrolytes as indicated. - BMET iN AM.  GASTROINTESTINAL A:   No acute issue P:   - Advance diet as able. - Continue home PPI.  HEMATOLOGIC A:   Anemia, chronic P:  - Follow CBC. - Transfusion per ICU protocol.  INFECTIOUS A:   Severe sepsis 2/2 HCAP > improving  P:   BCx2 12/23 >>>ng Sputum 12/23 >>> Abx: cefepime, start date 12/23>>>  Abx: vancomycin, start date 12/23>>>12/28 (planned)  - Trend WBC and fever curve, low pct reassuring.  ENDOCRINE A:   No acute issues    P:   - Follow glucose on chemistries.  NEUROLOGIC A:   Anxiety Depression P:   - Hold sedating meds for now  (xanax, fluoxetine, oxycodone).   FAMILY  - Updates: patient and husband updated at length bedside.  Full code for now but no trach/peg.  - Inter-disciplinary family meet or Palliative Care meeting due by:  12/30  Will maintain as full code for now.  Hold ambulation.  Hold PT/OT.  Continue diureses.  Diet is tolerated.  The patient is critically ill with multiple organ systems failure and requires high complexity decision making for assessment and support, frequent evaluation and titration of therapies, application of advanced monitoring technologies and extensive interpretation of multiple databases.   Critical Care Time devoted to patient care services described in this note is  35  Minutes. This time reflects time of care of this signee Dr Jennet Maduro. This critical care time does not reflect procedure time, or teaching time or supervisory time of PA/NP/Med student/Med Resident etc but could involve care discussion time.  Rush Farmer, M.D. [facility] Pulmonary/Critical  Care Medicine. Pager: [phone]. After hours pager: [phone].  Jennet Maduro MD

## 2014-09-11 NOTE — Progress Notes (Signed)
Attempted to wean pt from non-re-breather mask (as O2 sats were maintaining 93-96%) to venti-mask at 55% FIO2. Pt's  O2 sats dropped to 83% on venti mask. Pt placed back on Non-re-breather mask at 100% FIO2. It took several minutes for pt's O2 stats to return to above 90%. Will not attempt to wean again at this time. Will continue to monitor.

## 2014-09-11 NOTE — Progress Notes (Signed)
Thank you for consulting the Palliative Medicine Team at [facility] to meet your patient's and family's needs.   The reason that you asked [place] to see your patient is  For Clarification of GOC and options  We have scheduled your patient for a meeting: 09-12-14 at 76 with husband  The Surrogate decision make is: Husband/Tim Dotts  Consult was requested by husband for PMT input and assistance.  Thye are hopeful for conversation with oncology ASAP.  [name] NP  Palliative Medicine Team Team Phone # [phone] Pager [phone]  Discussed with Dr Karleen Hampshire

## 2014-09-11 NOTE — Progress Notes (Signed)
Pt currently off BIPAP and on NRB mask, Pt is tolerating well at this time.  Pt is in no noted distress and has no increased WOB.  BIPAP not needed at this time, RT to monitor and assess as needed.

## 2014-09-11 NOTE — Progress Notes (Signed)
Foley leaking. Deflated balloon, advanced catheter slightly and re inflated balloon with 14cc of NS. Will continue to monitor for further leaking. Othella Boyer [facility]

## 2014-09-12 ENCOUNTER — Inpatient Hospital Stay (HOSPITAL_COMMUNITY): Payer: BC Managed Care – PPO

## 2014-09-12 DIAGNOSIS — J810 Acute pulmonary edema: Secondary | ICD-10-CM

## 2014-09-12 DIAGNOSIS — Z7189 Other specified counseling: Secondary | ICD-10-CM

## 2014-09-12 DIAGNOSIS — R0600 Dyspnea, unspecified: Secondary | ICD-10-CM

## 2014-09-12 DIAGNOSIS — R0902 Hypoxemia: Secondary | ICD-10-CM

## 2014-09-12 LAB — GLUCOSE, CAPILLARY
GLUCOSE-CAPILLARY: 130 mg/dL — AB (ref 70–99)
GLUCOSE-CAPILLARY: 151 mg/dL — AB (ref 70–99)
Glucose-Capillary: 134 mg/dL — ABNORMAL HIGH (ref 70–99)
Glucose-Capillary: 139 mg/dL — ABNORMAL HIGH (ref 70–99)
Glucose-Capillary: 201 mg/dL — ABNORMAL HIGH (ref 70–99)
Glucose-Capillary: 173 mg/dL — ABNORMAL HIGH (ref 70–99)

## 2014-09-12 LAB — CULTURE, RESPIRATORY

## 2014-09-12 LAB — BASIC METABOLIC PANEL WITH GFR
Anion gap: 8 (ref 5–15)
BUN: 27 mg/dL — ABNORMAL HIGH (ref 6–23)
CO2: 35 mmol/L — ABNORMAL HIGH (ref 19–32)
Calcium: 8.4 mg/dL (ref 8.4–10.5)
Chloride: 95 meq/L — ABNORMAL LOW (ref 96–112)
Creatinine, Ser: 0.56 mg/dL (ref 0.50–1.10)
GFR calc Af Amer: >90 mL/min
GFR calc non Af Amer: >90 mL/min
Glucose, Bld: 159 mg/dL — ABNORMAL HIGH (ref 70–99)
Potassium: 3.6 mmol/L (ref 3.5–5.1)
Sodium: 138 mmol/L (ref 135–145)

## 2014-09-12 LAB — CBC
HCT: 32.3 % — ABNORMAL LOW (ref 36.0–46.0)
Hemoglobin: 10.3 g/dL — ABNORMAL LOW (ref 12.0–15.0)
MCH: 27.5 pg (ref 26.0–34.0)
MCHC: 31.9 g/dL (ref 30.0–36.0)
MCV: 86.1 fL (ref 78.0–100.0)
Platelets: 305 K/uL (ref 150–400)
RBC: 3.75 MIL/uL — ABNORMAL LOW (ref 3.87–5.11)
RDW: 14 % (ref 11.5–15.5)
WBC: 13.3 K/uL — ABNORMAL HIGH (ref 4.0–10.5)

## 2014-09-12 LAB — CULTURE, BLOOD (ROUTINE X 2)
CULTURE: NO GROWTH
Culture: NO GROWTH

## 2014-09-12 LAB — LEGIONELLA ANTIGEN, URINE

## 2014-09-12 LAB — CULTURE, RESPIRATORY W GRAM STAIN

## 2014-09-12 LAB — MAGNESIUM: Magnesium: 2 mg/dL (ref 1.5–2.5)

## 2014-09-12 LAB — CLOSTRIDIUM DIFFICILE BY PCR: Toxigenic C. Difficile by PCR: NEGATIVE

## 2014-09-12 LAB — PHOSPHORUS: Phosphorus: 3.1 mg/dL (ref 2.3–4.6)

## 2014-09-12 MED ORDER — FUROSEMIDE 10 MG/ML IJ SOLN: 40.0000 mg | Freq: Four times a day (QID) | INTRAMUSCULAR | Status: DC

## 2014-09-12 MED ORDER — SODIUM CHLORIDE 0.9 % IJ SOLN
10.0000 mL | Freq: Two times a day (BID) | INTRAMUSCULAR | Status: DC
  Administered 2014-09-12 – 2014-09-17 (×12): 10 mL

## 2014-09-12 MED ORDER — TIOTROPIUM BROMIDE MONOHYDRATE 18 MCG IN CAPS
18.0000 ug | ORAL_CAPSULE | Freq: Every day | RESPIRATORY_TRACT | Status: DC
  Administered 2014-09-12 – 2014-09-21 (×8): 18 ug via RESPIRATORY_TRACT
  Filled 2014-09-12 (×2): qty 5

## 2014-09-12 MED ORDER — POTASSIUM CHLORIDE CRYS ER 20 MEQ PO TBCR: 40.0000 meq | EXTENDED_RELEASE_TABLET | Freq: Three times a day (TID) | ORAL | Status: DC

## 2014-09-12 MED ORDER — POTASSIUM CHLORIDE CRYS ER 20 MEQ PO TBCR
40.0000 meq | EXTENDED_RELEASE_TABLET | Freq: Three times a day (TID) | ORAL | Status: AC
  Administered 2014-09-12 (×2): 40 meq via ORAL
  Filled 2014-09-12 (×2): qty 2

## 2014-09-12 MED ORDER — SODIUM CHLORIDE 0.9 % IJ SOLN
10.0000 mL | INTRAMUSCULAR | Status: DC | PRN
  Administered 2014-09-19 – 2014-09-20 (×3): 10 mL
  Filled 2014-09-12 (×3): qty 40

## 2014-09-12 MED ORDER — FUROSEMIDE 10 MG/ML IJ SOLN
40.0000 mg | Freq: Four times a day (QID) | INTRAMUSCULAR | Status: AC
  Administered 2014-09-12 – 2014-09-13 (×3): 40 mg via INTRAVENOUS
  Filled 2014-09-12 (×3): qty 4

## 2014-09-12 NOTE — Progress Notes (Signed)
ANTIBIOTIC CONSULT NOTE - Follow up  Pharmacy Consult for Cefepime Indication: rule out pneumonia  No Known Allergies  Patient Measurements: Height: 5\' 5"  (165.1 cm) Weight: 194 lb 10.7 oz (88.3 kg) IBW/kg (Calculated) : 57  Vital Signs: Temp: 97.8 F (36.6 C) (12/29 0800) Temp Source: Oral (12/29 0800) BP: 154/67 mmHg (12/29 0800) Pulse Rate: 75 (12/29 0815) Intake/Output from previous day: 12/28 0701 - 12/29 0700 In: 1080 [P.O.:360; I.V.:220; IV Piggyback:500] Out: 3580 [Urine:3580] Intake/Output from this shift:    Labs:  Recent Labs  09/10/14 0359 09/11/14 0352 09/12/14 0350  WBC 12.0* 12.7* 13.3*  HGB 9.2* 10.2* 10.3*  PLT 315 322 305  CREATININE 0.47* 0.56 0.56   Estimated Creatinine Clearance: 81 mL/min (by C-G formula based on Cr of 0.56).  Recent Labs  09/09/14 1700  [place] 24.3*     Microbiology: Recent Results (from the past 720 hour(s))  Blood culture (routine x 2)     Status: None   Collection Time: 09/06/14  3:32 PM  Result Value Ref Range Status   Specimen Description BLOOD LEFT FOREARM  Final   Special Requests BOTTLES DRAWN AEROBIC AND ANAEROBIC 5CC  Final   Culture  Setup Time   Final    09/06/2014 20:48 Performed at [facility]    Culture   Final    NO GROWTH 5 DAYS Performed at [facility]    Report Status 09/12/2014 FINAL  Final  Blood culture (routine x 2)     Status: None   Collection Time: 09/06/14  3:33 PM  Result Value Ref Range Status   Specimen Description BLOOD LEFT ANTECUBITAL  Final   Special Requests BOTTLES DRAWN AEROBIC AND ANAEROBIC 5CC  Final   Culture  Setup Time   Final    09/06/2014 20:48 Performed at [place]   Final    NO GROWTH 5 DAYS Performed at [facility]    Report Status 09/12/2014 FINAL  Final  MRSA PCR Screening     Status: None   Collection Time: 09/06/14  5:59 PM  Result Value Ref Range Status   MRSA by PCR NEGATIVE NEGATIVE Final   Comment:        The GeneXpert MRSA Assay (FDA approved for NASAL specimens only), is one component of a comprehensive MRSA colonization surveillance program. It is not intended to diagnose MRSA infection nor to guide or monitor treatment for MRSA infections.   Culture, sputum-assessment     Status: None   Collection Time: 09/10/14  2:24 PM  Result Value Ref Range Status   Specimen Description SPUTUM  Final   Special Requests NONE  Final   Sputum evaluation   Final    THIS SPECIMEN IS ACCEPTABLE. RESPIRATORY CULTURE REPORT TO FOLLOW.   Report Status 09/10/2014 FINAL  Final  Culture, respiratory (NON-Expectorated)     Status: None   Collection Time: 09/10/14  2:24 PM  Result Value Ref Range Status   Specimen Description SPUTUM  Final   Special Requests NONE  Final   Gram Stain   Final    FEW WBC PRESENT, PREDOMINANTLY PMN NO SQUAMOUS EPITHELIAL CELLS SEEN FEW YEAST RARE GRAM POSITIVE COCCI IN PAIRS    Culture   Final    MODERATE CANDIDA ALBICANS Performed at [facility]    Report Status 09/12/2014 FINAL  Final  Clostridium Difficile by PCR     Status: None   Collection Time: 09/11/14  9:00 PM  Result Value Ref Range Status   C difficile by pcr NEGATIVE NEGATIVE Final    Comment: Performed at [facility]   Assessment: 63 yo female presents with abrupt onset of SHOB. She was recently diagnosed with metastatic stage IV lung cancer during previous admission 08/08/14-08/22/14. She also notes pain in her right hip after felt something pop after getting out of the car yesterday. CXR shows significant increase in bilateral infiltrates, a portion which may be related to the patient's underlying neoplastic history. CTA reveals no PE but ground glass appearance with infectious vs inflammatory etiology.  12/23 >> Zosyn x 1 12/23 >> Vancomycin >> 12/29 12/23 >> Cefepime >>  Tmax: afeb since fever at time of admission WBC: remains mildly elevated. Renal: SCr  improved but below baseline; CrCl 81CG/N(using SCr = 0.8) PCT < 0.5, trending down  12/23 Blood x 2: neg 12/24 influenza: neg 12/23 MRSA: neg Legionella Ag: IP S. pneumo Ag: neg 12/27 sputum: rare GPC, mod candida albicans 12/28 Cdiff PCR: neg  12/29: D6 Cefepime 1g q8h for HCAP. Stopped Vanc. ? signif of candida in sputum - per CCM, hold-off on treating for now.  Goal of Therapy:  Appropriate antibiotic dosing for renal function; eradication of infection  Plan:   Narrowed therapy to Cefepime. Continue 1g IV q8h.  F/u renal function, clinical course, cultures as available.  Moderate Candida albicans noted in sputum from 12/27. Discussed with Dr. Nelda Marseille - Not sure of clinical significance since patient is afebrile and patient isn't immunocompromised currently. Hold off on treating and monitor.  Romeo Rabon, PharmD, pager [phone]. 09/12/2014,10:13 AM.

## 2014-09-12 NOTE — Progress Notes (Signed)
Subjective: The patient is seen and examined today. Patient is asleep, did not disturb. Son at bedside. He reports that, although dyspneic, symptoms slowly improving. No fever or chills. No other issues overnight. She continues on BiPAP.   Objective: Vital signs in last 24 hours: Temp:  [97.2 F (36.2 C)-98.2 F (36.8 C)] 98 F (36.7 C) (12/29 0400) Pulse Rate:  [72-82] 72 (12/29 0600) Resp:  [17-37] 20 (12/29 0600) BP: (100-153)/(19-72) 137/46 mmHg (12/29 0400) SpO2:  [77 %-97 %] 94 % (12/29 0600) FiO2 (%):  [55 %-100 %] 100 % (12/29 0600) Weight:  [194 lb 10.7 oz (88.3 kg)] 194 lb 10.7 oz (88.3 kg) (12/29 0500)  Intake/Output from previous day: 12/28 0701 - 12/29 0700 In: 1080 [P.O.:360; I.V.:220; IV Piggyback:500] Out: 3580 [Urine:3580] Intake/Output this shift:    General appearance: asleep, breathing comfortably with BiPAP Resp:diffuse, scattered rales bilaterally Cardio: regular rate and rhythm, S1, S2 normal, no murmur, click, rub or gallop GI: soft, non-tender; bowel sounds normal; no masses,  no organomegaly Extremities: extremities normal, atraumatic, no cyanosis or edema  Lab Results:   Recent Labs  09/11/14 0352 09/12/14 0350  WBC 12.7* 13.3*  HGB 10.2* 10.3*  HCT 32.2* 32.3*  PLT 322 305   BMET  Recent Labs  09/11/14 0352 09/12/14 0350  NA 139 138  K 3.2* 3.6  CL 101 95*  CO2 31 35*  GLUCOSE 145* 159*  BUN 27* 27*  CREATININE 0.56 0.56  CALCIUM 8.4 8.4    Studies/Results: Dg Chest Port 1 View  09/12/2014   CLINICAL DATA:  Respiratory failure.  EXAM: PORTABLE CHEST - 1 VIEW  COMPARISON:  09/03/2014.  FINDINGS: Mediastinum and hilar structures are normal. Heart size is stable. Diffuse severe bilateral pulmonary interstitial infiltrates are again noted without change. Basilar atelectasis present. No pleural effusion or pneumothorax.  IMPRESSION: 1. Persistent severe bilateral interstitial infiltrates. No significant change from prior exam. 2.  Bibasilar atelectasis.   Electronically Signed   By: [place]   On: 09/12/2014 07:49   Dg Chest Port 1 View  09/11/2014   CLINICAL DATA:  Respiratory distress.  EXAM: PORTABLE CHEST - 1 VIEW  COMPARISON:  09/10/2014.  FINDINGS: Mediastinum and hilar structures are normal. Heart size is stable. Diffuse severe bilateral interstitial infiltrates are present. Bibasilar subsegmental atelectasis. No pleural effusion or pneumothorax. No acute osseus abnormality .  IMPRESSION: 1. Persistent severe bilateral interstitial infiltrates without significant change from prior exam.  2.  Bibasilar atelectasis.   Electronically Signed   By: Marcello Moores  Register   On: 09/11/2014 07:05   Dg Chest Port 1 View  09/10/2014   CLINICAL DATA:  Acute respiratory failure.  Lung cancer.  EXAM: PORTABLE CHEST - 1 VIEW  COMPARISON:  09/09/2014  FINDINGS: Lungs are adequately inflated and demonstrate continued bilateral diffuse mixed interstitial airspace process without significant change. More focal consolidation over the right base slightly worse. Possible small left effusion. Cardiomediastinal silhouette and remainder of the exam is unchanged.  IMPRESSION: Stable diffuse bilateral mixed interstitial airspace process with slight worsening more focal airspace density in the right base. Findings are likely due to infection. Possible small left effusion.   Electronically Signed   By: Marin Olp M.D.   On: 09/10/2014 10:23    Medications:  Scheduled Meds: . antiseptic oral rinse  7 mL Mouth Rinse q12n4p  . ARIPiprazole  2 mg Oral Daily  . ceFEPime (MAXIPIME) IV  1 g Intravenous 3 times per day  . chlorhexidine  15 mL Mouth Rinse BID  . enoxaparin (LOVENOX) injection  40 mg Subcutaneous Q24H  . ferrous sulfate  325 mg Oral BID WC  . FLUoxetine  40 mg Oral BID  . folic acid  1 mg Oral Daily  . insulin aspart  0-15 Units Subcutaneous 6 times per day  . lactose free nutrition  237 mL Oral BID  . methylPREDNISolone  (SOLU-MEDROL) injection  80 mg Intravenous Q12H  . OxyCODONE  30 mg Oral 3 times per day  . pantoprazole  40 mg Oral Daily  . polyethylene glycol  17 g Oral Daily  . senna-docusate  2 tablet Oral BID  . simvastatin  40 mg Oral QHS  . vancomycin  750 mg Intravenous Q12H   Continuous Infusions: . sodium chloride 500 mL (09/11/14 2300)   PRN Meds:.albuterol, ALPRAZolam, ondansetron (ZOFRAN) IV, oxyCODONE  Assessment/Plan: This is a very pleasant 63 years old white female with  1. metastatic poorly differentiated non-small cell lung cancer with bone and brain metastasis.  She status post a stereotactic radiotherapy to the bone and brain lesion. She was supposed to start systemic chemotherapy with carboplatin, Alimta and Avastin on 12/30 Chemotherapy is on hold  until improvement in her condition.  2 Acute hypoxemic respiratory failure Bilateral pleural effusions She was admitted recently with significant dyspnea and fever CT angiogram of the chest showed no evidence for pulmonary embolism bu ground glass opacities consistent with infectious/inflammatory etiology. Symptoms slightly improving Continue the antibiotic coverage with cefepime and vancomycin.  3.COPD  Continue solumedrol 80 mg q 8 hrs and albuterol, IS and BiPAP.  4. DVT/GI prophylaxis On Lovenox and Protonix  5. Hypotension Resolved with IVF replenishment.   Chronic diastolic Heart Failure.  Echo 12/1 > LVEF 55-60%  5. Anemia of neoplastic disease In the setting of infection, dilution, antibiotics  No transfusion indicated at this time. Continue Ferrous Sulfate oral doses  Continue to monitor.  6. Leukocytosis In the setting of steroids, inflammation, acute illness Patient afebrile, on antibiotics. No intervention indicated at this time, continue to monitor  Thank you for taking good care of Emily West. Will continue to follow up the patient with you and assist in her management  LOS: 6 days     [facility] E 09/12/2014  ADDENDUM: Hematology/Oncology Attending: The patient is seen today. I agree with the above note. I had a lengthy discussion with the patient and his family about her current condition. 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 She had palliative radiotherapy to her bone and brain disease. The patient was supposed to start the first cycle of her systemic chemotherapy this week but unfortunately she was admitted to [facility] with worsening dyspnea and she was found to have bilateral pneumonia plus/minus pulmonary edema. She has been treated with aggressive antibiotics but unfortunately no significant improvement in her condition so far. I had a lengthy discussion with the family about her current disease status and treatment options. Unfortunately I will not be able to start any systemic therapy on this patient until improvement in her condition. The family would like to continue with the current supportive care but they are not willing to proceed with any aggressive measures including intubation or aggressive resuscitation. Definitely of the patient has improvement in her condition, I would consider her for treatment in the future. The family are very realistic of her current situation and  poor prognosis. They were very appreciative for the input from medical oncology. Thank you so much for taking good care of Emily West, I will continue to follow up the patient with you and assist in her management on as-needed basis.

## 2014-09-12 NOTE — Progress Notes (Signed)
Name: Emily West [facility] MRN: [id number] DOB: 1950/11/06    ADMISSION DATE:  09/06/2014 CONSULTATION DATE:  09/06/2014  REFERRING MD :  Karleen Hampshire  CHIEF COMPLAINT:  SOB  BRIEF PATIENT DESCRIPTION: 63 year old female with recent dx stage IV NSCLC admitted 12/23 for hypoxic respiratory failure 2/2 presumed CAP. Despite high flow O2 she remained hypoxic. PCCM asked to consult. - was to start chemo on 12/30 PRIOR THERAPY:  1) Stereotactic radiotherapy to the metastatic bone lesions at T10 and L3 under Dr. [name] and Dr. Vertell Limber. 2) stereotactic radiotherapy to a solitary brain lesion under the care of Dr. [name].  SIGNIFICANT EVENTS  12/25 bipap overnight  STUDIES:  Echo 12/1 > LVEF 55-60% CT chest 12/23 > bilateral GGO with bilateral pleural effusions. Pulm edema vs hypersensitivity pneumonitis. Pathologic T10 vertebral body fx.    SUBJECTIVE: Remains dyspneic, increased WOB off BIPAP and restarted. Afebrile Denies pain  VITAL SIGNS: Temp:  [97.2 F (36.2 C)-98.2 F (36.8 C)] 97.8 F (36.6 C) (12/29 0800) Pulse Rate:  [72-82] 75 (12/29 0815) Resp:  [17-37] 19 (12/29 0815) BP: (100-154)/(19-72) 154/67 mmHg (12/29 0800) SpO2:  [77 %-97 %] 89 % (12/29 0815) FiO2 (%):  [55 %-100 %] 100 % (12/29 0600) Weight:  [88.3 kg (194 lb 10.7 oz)] 88.3 kg (194 lb 10.7 oz) (12/29 0500)  PHYSICAL EXAMINATION:  Gen: resting comfortably on BiPAP HEENT: NCAT, EOMi PULM: Crackles Bilaterally scatterd CV: RRR, no mgr AB: BS+, soft, nontender Ext: warm no edema Neuro: A&Ox4, maew  Recent Labs Lab 09/10/14 0359 09/11/14 0352 09/12/14 0350  NA 140 139 138  K 4.0 3.2* 3.6  CL 110 101 95*  CO2 27 31 35*  BUN 25* 27* 27*  CREATININE 0.47* 0.56 0.56  GLUCOSE 151* 145* 159*    Recent Labs Lab 09/10/14 0359 09/11/14 0352 09/12/14 0350  HGB 9.2* 10.2* 10.3*  HCT 29.1* 32.2* 32.3*  WBC 12.0* 12.7* 13.3*  PLT 315 322 305   Dg Chest Port 1 View  09/12/2014   CLINICAL DATA:   Respiratory failure.  EXAM: PORTABLE CHEST - 1 VIEW  COMPARISON:  09/03/2014.  FINDINGS: Mediastinum and hilar structures are normal. Heart size is stable. Diffuse severe bilateral pulmonary interstitial infiltrates are again noted without change. Basilar atelectasis present. No pleural effusion or pneumothorax.  IMPRESSION: 1. Persistent severe bilateral interstitial infiltrates. No significant change from prior exam. 2. Bibasilar atelectasis.   Electronically Signed   By: [place]   On: 09/12/2014 07:49   Dg Chest Port 1 View  09/11/2014   CLINICAL DATA:  Respiratory distress.  EXAM: PORTABLE CHEST - 1 VIEW  COMPARISON:  09/10/2014.  FINDINGS: Mediastinum and hilar structures are normal. Heart size is stable. Diffuse severe bilateral interstitial infiltrates are present. Bibasilar subsegmental atelectasis. No pleural effusion or pneumothorax. No acute osseus abnormality .  IMPRESSION: 1. Persistent severe bilateral interstitial infiltrates without significant change from prior exam.  2.  Bibasilar atelectasis.   Electronically Signed   By: Marcello Moores  Register   On: 09/11/2014 07:05   Dg Chest Port 1 View  09/10/2014   CLINICAL DATA:  Acute respiratory failure.  Lung cancer.  EXAM: PORTABLE CHEST - 1 VIEW  COMPARISON:  09/09/2014  FINDINGS: Lungs are adequately inflated and demonstrate continued bilateral diffuse mixed interstitial airspace process without significant change. More focal consolidation over the right base slightly worse. Possible small left effusion. Cardiomediastinal silhouette and remainder of the exam is unchanged.  IMPRESSION: Stable diffuse bilateral mixed interstitial airspace  process with slight worsening more focal airspace density in the right base. Findings are likely due to infection. Possible small left effusion.   Electronically Signed   By: Marin Olp M.D.   On: 09/10/2014 10:23    ASSESSMENT / PLAN:  PULMONARY A: Acute hypoxemic respiratory failure. ddx includes  pulmonary edema, HCAP vs pneumonitis vs lymphangitic spread Bilateral pleural effusions Stage IV Non Small Cell Cancer -mets to brain & spine CT noted with post inflammatory changes P:   - BIPAP for comfort as needed. - Need to discuss code status given advanced cancer and advanced lung disease, patient does not want long term support with ventilator but short term only.  Would like to speak with Dr. Inda Merlin which is reasonable.  Spoke with Dr. Inda Merlin today, he will see patient this afternoon after clinic. - Titrate FiO2 to maintatin SpO2 > 90%. - Solumedrol 80 q 8h. - Incentive spirometry. - PRN albuterol. - Trend CXR & FIO2 for improvement. - High risk intubation -Obtain BAL cytology if intubated. - Add spiriva. - Continue to diurese.  CARDIOVASCULAR A:  Hypotension resolved H/o HTN Chronic diastolic heart failure, doubt exacerbation, low BNP P:  - Telemetry. - Holding home anti-hypertensive's for now (irbesartan). - Continue statin.  RENAL A:   No acute issues P:   - Lasix 40 mg IV q6 x3 doses. - Replace electrolytes as indicated. - BMET iN AM.  GASTROINTESTINAL A:   No acute issue P:   - Advance diet as able. - Continue home PPI.  HEMATOLOGIC A:   Anemia, chronic P:  - Follow CBC. - Transfusion per ICU protocol.  INFECTIOUS A:   Severe sepsis 2/2 HCAP > improving  P:   BCx2 12/23 >>>NTD Sputum 12/23 >>>NTD Abx: cefepime, start date 12/23>>>  Abx: vancomycin, start date 12/23>>>12/29  - Trend WBC and fever curve, low pct reassuring. - D/C vancomycin today, continue cefepime for now.  ENDOCRINE A:   No acute issues    P:   - Follow glucose on chemistries.  NEUROLOGIC A:   Anxiety Depression P:   - Hold sedating meds for now  (xanax, fluoxetine, oxycodone).  FAMILY  - Updates: patient, son and husband updated at length bedside.  Full code for now but no trach/peg.  Spoke with Dr. Inda Merlin who will come see her after clinic to discuss  code status.  - Inter-disciplinary family meet or Palliative Care meeting due by:  12/30  The patient is critically ill with multiple organ systems failure and requires high complexity decision making for assessment and support, frequent evaluation and titration of therapies, application of advanced monitoring technologies and extensive interpretation of multiple databases.   Critical Care Time devoted to patient care services described in this note is  35  Minutes. This time reflects time of care of this signee Dr Jennet Maduro. This critical care time does not reflect procedure time, or teaching time or supervisory time of PA/NP/Med student/Med Resident etc but could involve care discussion time.  Rush Farmer, M.D. [facility] Pulmonary/Critical Care Medicine. Pager: [phone]. After hours pager: [phone].  Jennet Maduro MD

## 2014-09-12 NOTE — Progress Notes (Signed)
Pt pulled mask off desated to 43%. With increased WOB and dyspnea.NRB placed back on face with sats recovering to 93%. Pt very scared and asking RN stay and not leave her alone.

## 2014-09-12 NOTE — Consult Note (Signed)
Patient EL:FYBOFBP K General      DOB: 1951-01-20      ZWC:[id number]     Consult Note from the Palliative Medicine Team at [place] Requested by: Gaspar Garbe     PCP: [name], MD Reason for Consultation:  Clarification of [place] and options    Phone Number:[phone]  Assessment of patients Current state:   Rapid  Physical and functional decline since diagnosis of lung cancer only a few months ago.  S/p radiation, chemotherapy on hold for now.    Patient and family faced with advanced directive decisions and anticipatory care needs.   Consult is for review of medical treatment options, clarification of goals of care, disposition and options, and symptom recommendation.  This NP [name] reviewed medical records, received report from team, assessed the patient and then meet at the patient's bedside along with husband [facility] and son Raquel Sarna  to discuss diagnosis, prognosis, GOC, disposition and options.  A detailed discussion was had today regarding advanced directives.  Concepts specific to code status, artifical feeding and hydration, continued IV antibiotics and rehospitalization was had.  The difference between a aggressive medical intervention path  and a palliative comfort care path for this patient at this time was had.  Values and goals of care important to patient and family were attempted to be elicited.  Concept of Hospice was discussed.  MOST form given to husband to review.    Questions and concerns addressed.   Family encouraged to call with questions or concerns.  PMT will continue to support holistically.   Goals of Care: 1.  Code Status: Full code   2. Scope of Treatment: Continue current medical interventions, family remains hopeful for improvement  3. Disposition:  Home is priority, considering hospice benefit   4. Symptom Management:   1. Weakness: PT/OT as tolerated   5. Psychosocial:  Emotional support offered to patient  and her family.  Pateitn initiated conversation regarding her recent insight into her own mortality, she tells me she is more at peaceful with the situation.  6. Spiritual:  Chaplain consulted     Patient Documents Completed or Given: Document Given Completed  Advanced Directives Pkt    MOST X   DNR    Gone from My Sight    Hard Choices      Brief HPI: Per oncology note 09-01-14 DIAGNOSIS: Stage IV (Tx, N2, M1b) poorly differentiated non-small cell carcinoma with negative EGFR mutation and negative for gene translocation but positive PDL 1 expression (80%) diagnosed in November 2015.  PRIOR THERAPY:  1) Stereotactic radiotherapy to the metastatic bone lesions at T10 and L3 under the care of Dr. [name] and Dr. Vertell Limber. 2) stereotactic radiotherapy to a solitary brain lesion under the care of Dr. [name].  CURRENT THERAPY: Systemic chemotherapy with carboplatin for AUC of 5, Alimta 500 MG/M2 and Avastin 15 MG/KG every 3 weeks. First dose 09/13/2014.  Admitted to [facility] on 09-06-14 with acute respiratory failure, requiring 100% NRB, unable to tolerate chemo at this time.   ROS: dyspnea, increased WOB on minimal exertion   PMH:  Past Medical History  Diagnosis Date  . History of suicidal ideation   . HTN (hypertension)   . HLD (hyperlipidemia)   . Shortness of breath   . Anxiety and depression   . Menopause   . Benign hematuria   . Impaired fasting glucose   . Other abnormal glucose   . Cancer  PSH: Past Surgical History  Procedure Laterality Date  . Colonoscopy  11/21/2003     rare early left-sided diverticula remaining, low sigmoid anastomosis, internal and external hemorrhoids,   . Cystoscopy    . Lymph node biopsy Right 08/09/2014    Procedure: Right axillary lymph node biopsy;  Surgeon: Alphonsa Overall, MD;  Location: [facility] ORS;  Service: General;  Laterality: Right;   I have reviewed the [place] and SH and  If appropriate update it with new information. No  Known Allergies Scheduled Meds: . antiseptic oral rinse  7 mL Mouth Rinse q12n4p  . ARIPiprazole  2 mg Oral Daily  . ceFEPime (MAXIPIME) IV  1 g Intravenous 3 times per day  . chlorhexidine  15 mL Mouth Rinse BID  . enoxaparin (LOVENOX) injection  40 mg Subcutaneous Q24H  . ferrous sulfate  325 mg Oral BID WC  . FLUoxetine  40 mg Oral BID  . folic acid  1 mg Oral Daily  . insulin aspart  0-15 Units Subcutaneous 6 times per day  . lactose free nutrition  237 mL Oral BID  . methylPREDNISolone (SOLU-MEDROL) injection  80 mg Intravenous Q12H  . OxyCODONE  30 mg Oral 3 times per day  . pantoprazole  40 mg Oral Daily  . polyethylene glycol  17 g Oral Daily  . senna-docusate  2 tablet Oral BID  . simvastatin  40 mg Oral QHS  . vancomycin  750 mg Intravenous Q12H   Continuous Infusions: . sodium chloride 500 mL (09/11/14 2300)   PRN Meds:.albuterol, ALPRAZolam, ondansetron (ZOFRAN) IV, oxyCODONE    BP 137/46 mmHg  Pulse 72  Temp(Src) 98 F (36.7 C) (Oral)  Resp 22  Ht '5\' 5"'  (1.651 m)  Wt 88.3 kg (194 lb 10.7 oz)  BMI 32.39 kg/m2  SpO2 95%   PPS:30 % at best   Intake/Output Summary (Last 24 hours) at 09/12/14 0533 Last data filed at 09/12/14 0500  Gross per 24 hour  Intake   1030 ml  Output   3595 ml  Net  -2565 ml    Physical Exam:  General: ill appearing, NAD  on 100% NRB HEENT: moist buccal membranes Chest:   Decreased in bases CVS: tchycardic Abdomen:soft NT +BS Neuro:alert and oriented X3  Labs: CBC    Component Value Date/Time   WBC 13.3* 09/12/2014 0350   WBC 8.6 09/01/2014 0835   RBC 3.75* 09/12/2014 0350   RBC 3.82 09/01/2014 0835   RBC 3.64* 08/15/2014 0425   HGB 10.3* 09/12/2014 0350   HGB 10.6* 09/01/2014 0835   HCT 32.3* 09/12/2014 0350   HCT 34.0* 09/01/2014 0835   PLT 305 09/12/2014 0350   PLT 299 09/01/2014 0835   MCV 86.1 09/12/2014 0350   MCV 89.0 09/01/2014 0835   MCH 27.5 09/12/2014 0350   MCH 27.7 09/01/2014 0835   MCHC 31.9  09/12/2014 0350   MCHC 31.2* 09/01/2014 0835   RDW 14.0 09/12/2014 0350   RDW 14.2 09/01/2014 0835   LYMPHSABS 1.1 09/06/2014 1208   LYMPHSABS 1.2 09/01/2014 0835   MONOABS 1.0 09/06/2014 1208   MONOABS 0.6 09/01/2014 0835   EOSABS 0.1 09/06/2014 1208   EOSABS 0.0 09/01/2014 0835   BASOSABS 0.0 09/06/2014 1208   BASOSABS 0.0 09/01/2014 0835    BMET    Component Value Date/Time   NA 138 09/12/2014 0350   NA 138 09/01/2014 0835   K 3.6 09/12/2014 0350   K 4.8 09/01/2014 0835   CL 95* 09/12/2014 0350  CO2 35* 09/12/2014 0350   CO2 25 09/01/2014 0835   GLUCOSE 159* 09/12/2014 0350   GLUCOSE 120 09/01/2014 0835   BUN 27* 09/12/2014 0350   BUN 13.5 09/01/2014 0835   CREATININE 0.56 09/12/2014 0350   CREATININE 0.8 09/01/2014 0835   CALCIUM 8.4 09/12/2014 0350   CALCIUM 8.9 09/01/2014 0835   GFRNONAA >90 09/12/2014 0350   GFRAA >90 09/12/2014 0350    CMP     Component Value Date/Time   NA 138 09/12/2014 0350   NA 138 09/01/2014 0835   K 3.6 09/12/2014 0350   K 4.8 09/01/2014 0835   CL 95* 09/12/2014 0350   CO2 35* 09/12/2014 0350   CO2 25 09/01/2014 0835   GLUCOSE 159* 09/12/2014 0350   GLUCOSE 120 09/01/2014 0835   BUN 27* 09/12/2014 0350   BUN 13.5 09/01/2014 0835   CREATININE 0.56 09/12/2014 0350   CREATININE 0.8 09/01/2014 0835   CALCIUM 8.4 09/12/2014 0350   CALCIUM 8.9 09/01/2014 0835   PROT 6.6 09/06/2014 1208   PROT 6.5 09/01/2014 0835   ALBUMIN 2.5* 09/06/2014 1208   ALBUMIN 2.5* 09/01/2014 0835   AST 21 09/06/2014 1208   AST 17 09/01/2014 0835   ALT 12 09/06/2014 1208   ALT 20 09/01/2014 0835   ALKPHOS 137* 09/06/2014 1208   ALKPHOS 172* 09/01/2014 0835   BILITOT 0.9 09/06/2014 1208   BILITOT 0.60 09/01/2014 0835   GFRNONAA >90 09/12/2014 0350   GFRAA >90 09/12/2014 0350    Time In Time Out Total Time Spent with Patient Total Overall Time   1245 1355 70 min 70 min    Greater than 50%  of this time was spent counseling and coordinating care  related to the above assessment and plan.   [name] NP  Palliative Medicine Team Team Phone # [phone] Pager [phone]  Discussed with Dr Karleen Hampshire

## 2014-09-12 NOTE — Progress Notes (Signed)
Peripherally Inserted Central Catheter/Midline Placement  The IV Nurse has discussed with the patient and/or persons authorized to consent for the patient, the purpose of this procedure and the potential benefits and risks involved with this procedure.  The benefits include less needle sticks, lab draws from the catheter and patient may be discharged home with the catheter.  Risks include, but not limited to, infection, bleeding, blood clot (thrombus formation), and puncture of an artery; nerve damage and irregular heat beat.  Alternatives to this procedure were also discussed.  PICC/Midline Placement Documentation        Emily West 09/12/2014, 2:59 PM

## 2014-09-12 NOTE — Progress Notes (Signed)
Lengthy visit with patient and family. Patient talked about dying. She is processing her life and what death will be like--whether she is ready or not. She asked me some questions about meaning and dying.  I listened and answered as was appropriate to affirm her thoughts and help her find peace. I sensed that she still have some business that feels unfinished but didn't want to talk about it in front of her family. She asked me to come back tomorrow morning. Prayer.  Epifania Gore, PhD, [place]

## 2014-09-12 NOTE — Progress Notes (Addendum)
[facility] PROGRESS NOTE  Miyo Aina [employer] [facility] OXB:[id number] DOB: November 18, 1950 DOA: 09/06/2014 PCP: [name], MD Interim summary: 63 year old with h/o recent diagnosis of stage 4 NSCLC , admitted for acute hypoxic respiratory failure presumed to be infectious. She was started on high dose steroids and IV antibiotics and has been on NRB since admission. PCCm and oncology consulted. Patient's husband requested palliative care meeting for goals of care.   Assessment/Plan: 1. Acute hypoxic respiratory failure: Admitted to step down, currently requiring NRB since admission. Unclear etiology probably pneumonitis from infectious process.treating her for both for health care associated pneumonia with IV antibiotics and IV steroids for inflammation.  Minimal pleural effusions and worsened opacities on CXR. Started her on IV lasix on 12/27, . Incentive spirometry,  Nebulizer treatments if needed. Keep sats greater than 90%. Influenza PCR is negative. BNP is 227.  PCCM consulted and recommendations given.  Currently she is full code    NSCLC with bone and brain mets: - s/p palliative radiation treatment. Chemo scheduled for 12/30. But on hold for now.   Dr [name] on board . Family wanted to go ahead with goals of care meeting with palliative team.   Hypokalemia  repleted as needed  Anemia: Normocytic and at baseline.   Mild leukocytosis: Possibly from the infection.      Code Status: full code Family Communication:husband at bedside Disposition Plan: remain in step down.    Consultants:  PCCM  ONCOLOGY  Procedures:  none  Antibiotics:  IV vancomycin   IV CEFEPIME 12/23  HPI/Subjective: On NRB today. She tried to take the mask off and her sats dropped to 60% .   Objective: Filed Vitals:   09/12/14 0815  BP:   Pulse: 75  Temp:   Resp: 19    Intake/Output Summary (Last 24 hours) at 09/12/14 1003 Last data filed at 09/12/14 0600  Gross per 24 hour  Intake    1030 ml  Output   3580 ml  Net  -2550 ml   Filed Weights   09/09/14 0400 09/10/14 0400 09/12/14 0500  Weight: 93 kg (205 lb 0.4 oz) 94.7 kg (208 lb 12.4 oz) 88.3 kg (194 lb 10.7 oz)    Exam:   General:  Alert afebrile in mild address.   Cardiovascular: s1s2, no mrg.   Respiratory: diminished air entry at bases, no wheezing or rhonchi.  Abdomen: soft non tender non distended bowel sounds heard  Musculoskeletal: no pedal edema.   Data Reviewed: Basic Metabolic Panel:  Recent Labs Lab 09/08/14 0650 09/09/14 0409 09/10/14 0359 09/11/14 0352 09/12/14 0350  NA 134* 135 140 139 138  K 3.4* 3.8 4.0 3.2* 3.6  CL 102 106 110 101 95*  CO2 24 22 27 31  35*  GLUCOSE 101* 139* 151* 145* 159*  BUN 17 19 25* 27* 27*  CREATININE 0.61 0.52 0.47* 0.56 0.56  CALCIUM 7.9* 8.4 8.7 8.4 8.4  MG  --   --   --   --  2.0  PHOS  --   --   --   --  3.1   Liver Function Tests:  Recent Labs Lab 09/06/14 1208  AST 21  ALT 12  ALKPHOS 137*  BILITOT 0.9  PROT 6.6  ALBUMIN 2.5*   No results for input(s): LIPASE, AMYLASE in the last 168 hours. No results for input(s): AMMONIA in the last 168 hours. CBC:  Recent Labs Lab 09/06/14 1208 09/07/14 0345 09/09/14 0409 09/10/14 0359 09/11/14 0352 09/12/14 0350  WBC 12.0* 12.6* 11.2* 12.0* 12.7* 13.3*  NEUTROABS 9.8*  --   --   --   --   --   HGB 9.9* 9.2* 9.6* 9.2* 10.2* 10.3*  HCT 31.4* 29.4* 31.2* 29.1* 32.2* 32.3*  MCV 88.5 87.8 89.9 88.4 86.6 86.1  PLT 289 227 319 315 322 305   Cardiac Enzymes: No results for input(s): CKTOTAL, CKMB, CKMBINDEX, TROPONINI in the last 168 hours. BNP (last 3 results)  Recent Labs  08/15/14 0425  PROBNP 114.8   CBG:  Recent Labs Lab 09/11/14 2006 09/11/14 2042 09/11/14 2326 09/12/14 0447 09/12/14 0812  GLUCAP 148* 134* 105* 139* 130*    Recent Results (from the past 240 hour(s))  Blood culture (routine x 2)     Status: None   Collection Time: 09/06/14  3:32 PM  Result Value Ref  Range Status   Specimen Description BLOOD LEFT FOREARM  Final   Special Requests BOTTLES DRAWN AEROBIC AND ANAEROBIC 5CC  Final   Culture  Setup Time   Final    09/06/2014 20:48 Performed at [facility]    Culture   Final    NO GROWTH 5 DAYS Performed at [facility]    Report Status 09/12/2014 FINAL  Final  Blood culture (routine x 2)     Status: None   Collection Time: 09/06/14  3:33 PM  Result Value Ref Range Status   Specimen Description BLOOD LEFT ANTECUBITAL  Final   Special Requests BOTTLES DRAWN AEROBIC AND ANAEROBIC 5CC  Final   Culture  Setup Time   Final    09/06/2014 20:48 Performed at [facility]   Final    NO GROWTH 5 DAYS Performed at [facility]    Report Status 09/12/2014 FINAL  Final  MRSA PCR Screening     Status: None   Collection Time: 09/06/14  5:59 PM  Result Value Ref Range Status   MRSA by PCR NEGATIVE NEGATIVE Final    Comment:        The GeneXpert MRSA Assay (FDA approved for NASAL specimens only), is one component of a comprehensive MRSA colonization surveillance program. It is not intended to diagnose MRSA infection nor to guide or monitor treatment for MRSA infections.   Culture, sputum-assessment     Status: None   Collection Time: 09/10/14  2:24 PM  Result Value Ref Range Status   Specimen Description SPUTUM  Final   Special Requests NONE  Final   Sputum evaluation   Final    THIS SPECIMEN IS ACCEPTABLE. RESPIRATORY CULTURE REPORT TO FOLLOW.   Report Status 09/10/2014 FINAL  Final  Culture, respiratory (NON-Expectorated)     Status: None   Collection Time: 09/10/14  2:24 PM  Result Value Ref Range Status   Specimen Description SPUTUM  Final   Special Requests NONE  Final   Gram Stain   Final    FEW WBC PRESENT, PREDOMINANTLY PMN NO SQUAMOUS EPITHELIAL CELLS SEEN FEW YEAST RARE GRAM POSITIVE COCCI IN PAIRS    Culture   Final    MODERATE CANDIDA ALBICANS Performed at [facility]    Report Status 09/12/2014 FINAL  Final  Clostridium Difficile by PCR     Status: None   Collection Time: 09/11/14  9:00 PM  Result Value Ref Range Status   C difficile by pcr NEGATIVE NEGATIVE Final    Comment: Performed at [facility]     Studies: Dg Chest [place]  1 View  09/12/2014   CLINICAL DATA:  Respiratory failure.  EXAM: PORTABLE CHEST - 1 VIEW  COMPARISON:  09/03/2014.  FINDINGS: Mediastinum and hilar structures are normal. Heart size is stable. Diffuse severe bilateral pulmonary interstitial infiltrates are again noted without change. Basilar atelectasis present. No pleural effusion or pneumothorax.  IMPRESSION: 1. Persistent severe bilateral interstitial infiltrates. No significant change from prior exam. 2. Bibasilar atelectasis.   Electronically Signed   By: [place]   On: 09/12/2014 07:49   Dg Chest Port 1 View  09/11/2014   CLINICAL DATA:  Respiratory distress.  EXAM: PORTABLE CHEST - 1 VIEW  COMPARISON:  09/10/2014.  FINDINGS: Mediastinum and hilar structures are normal. Heart size is stable. Diffuse severe bilateral interstitial infiltrates are present. Bibasilar subsegmental atelectasis. No pleural effusion or pneumothorax. No acute osseus abnormality .  IMPRESSION: 1. Persistent severe bilateral interstitial infiltrates without significant change from prior exam.  2.  Bibasilar atelectasis.   Electronically Signed   By: Marcello Moores  Register   On: 09/11/2014 07:05    Scheduled Meds: . antiseptic oral rinse  7 mL Mouth Rinse q12n4p  . ARIPiprazole  2 mg Oral Daily  . ceFEPime (MAXIPIME) IV  1 g Intravenous 3 times per day  . chlorhexidine  15 mL Mouth Rinse BID  . enoxaparin (LOVENOX) injection  40 mg Subcutaneous Q24H  . ferrous sulfate  325 mg Oral BID WC  . FLUoxetine  40 mg Oral BID  . folic acid  1 mg Oral Daily  . furosemide  40 mg Intravenous Q6H  . furosemide  40 mg Intravenous Q6H  . insulin aspart  0-15 Units Subcutaneous 6 times per day   . lactose free nutrition  237 mL Oral BID  . methylPREDNISolone (SOLU-MEDROL) injection  80 mg Intravenous Q12H  . OxyCODONE  30 mg Oral 3 times per day  . pantoprazole  40 mg Oral Daily  . polyethylene glycol  17 g Oral Daily  . potassium chloride  40 mEq Oral TID  . potassium chloride  40 mEq Oral TID  . senna-docusate  2 tablet Oral BID  . simvastatin  40 mg Oral QHS  . tiotropium  18 mcg Inhalation Daily   Continuous Infusions: . sodium chloride 500 mL (09/11/14 2300)    Active Problems:   Non-small cell lung cancer   HCAP (healthcare-associated pneumonia)   Acute respiratory failure with hypoxia   Hypotension   Sepsis    Time spent: 25 minutes.     [facility] Pager [phone]. If 7PM-7AM, please contact night-coverage at [email or web address], password [facility] 09/12/2014, 10:03 AM  LOS: 6 days

## 2014-09-13 ENCOUNTER — Ambulatory Visit: Payer: Self-pay

## 2014-09-13 ENCOUNTER — Inpatient Hospital Stay (HOSPITAL_COMMUNITY): Payer: BC Managed Care – PPO

## 2014-09-13 DIAGNOSIS — A419 Sepsis, unspecified organism: Secondary | ICD-10-CM | POA: Diagnosis present

## 2014-09-13 DIAGNOSIS — R6520 Severe sepsis without septic shock: Secondary | ICD-10-CM | POA: Diagnosis present

## 2014-09-13 DIAGNOSIS — R0600 Dyspnea, unspecified: Secondary | ICD-10-CM

## 2014-09-13 LAB — GLUCOSE, CAPILLARY
GLUCOSE-CAPILLARY: 134 mg/dL — AB (ref 70–99)
Glucose-Capillary: 144 mg/dL — ABNORMAL HIGH (ref 70–99)
Glucose-Capillary: 145 mg/dL — ABNORMAL HIGH (ref 70–99)
Glucose-Capillary: 147 mg/dL — ABNORMAL HIGH (ref 70–99)
Glucose-Capillary: 159 mg/dL — ABNORMAL HIGH (ref 70–99)

## 2014-09-13 LAB — BASIC METABOLIC PANEL
Anion gap: 7 (ref 5–15)
BUN: 27 mg/dL — AB (ref 6–23)
CO2: 40 mmol/L (ref 19–32)
Calcium: 8.6 mg/dL (ref 8.4–10.5)
Chloride: 88 mEq/L — ABNORMAL LOW (ref 96–112)
Creatinine, Ser: 0.63 mg/dL (ref 0.50–1.10)
GFR calc Af Amer: >90 mL/min (ref >90)
GFR calc non Af Amer: >90 mL/min (ref >90)
Glucose, Bld: 158 mg/dL — ABNORMAL HIGH (ref 70–99)
Potassium: 4.3 mmol/L (ref 3.5–5.1)
Sodium: 135 mmol/L (ref 135–145)

## 2014-09-13 LAB — CBC
HEMATOCRIT: 32.9 % — AB (ref 36.0–46.0)
Hemoglobin: 10.3 g/dL — ABNORMAL LOW (ref 12.0–15.0)
MCH: 27.2 pg (ref 26.0–34.0)
MCHC: 31.3 g/dL (ref 30.0–36.0)
MCV: 86.8 fL (ref 78.0–100.0)
Platelets: 313 10*3/uL (ref 150–400)
RBC: 3.79 MIL/uL — ABNORMAL LOW (ref 3.87–5.11)
RDW: 14.1 % (ref 11.5–15.5)
WBC: 13.9 10*3/uL — ABNORMAL HIGH (ref 4.0–10.5)

## 2014-09-13 LAB — PHOSPHORUS: Phosphorus: 4 mg/dL (ref 2.3–4.6)

## 2014-09-13 LAB — MAGNESIUM: Magnesium: 2.1 mg/dL (ref 1.5–2.5)

## 2014-09-13 MED ORDER — POTASSIUM CHLORIDE CRYS ER 20 MEQ PO TBCR
40.0000 meq | EXTENDED_RELEASE_TABLET | Freq: Three times a day (TID) | ORAL | Status: AC
  Administered 2014-09-13 (×2): 40 meq via ORAL
  Filled 2014-09-13 (×2): qty 2

## 2014-09-13 MED ORDER — ACETAZOLAMIDE SODIUM 500 MG IJ SOLR
250.0000 mg | Freq: Three times a day (TID) | INTRAMUSCULAR | Status: AC
  Administered 2014-09-13 (×2): 250 mg via INTRAVENOUS
  Filled 2014-09-13 (×2): qty 500

## 2014-09-13 MED ORDER — FUROSEMIDE 10 MG/ML IJ SOLN
40.0000 mg | Freq: Four times a day (QID) | INTRAMUSCULAR | Status: AC
  Administered 2014-09-13 (×3): 40 mg via INTRAVENOUS
  Filled 2014-09-13 (×3): qty 4

## 2014-09-13 MED ORDER — BOOST / RESOURCE BREEZE PO LIQD
1.0000 | Freq: Three times a day (TID) | ORAL | Status: DC
  Administered 2014-09-13 – 2014-09-21 (×19): 1 via ORAL
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
## 2014-09-13 NOTE — Progress Notes (Signed)
Follow up per patient request. Patient is processing rapid progression of her illness and all the medical information she is receiving. She is also doing life review. I facilitated this process and also supported family and they reminisced.  We talked about meaningful activities she can do while she is still able to let others know what she wants--planning her funeral, writing notes to people including her grandchild, making her wishes known about legacy. Brought her a prayer shawl and bone pillow.    Emily Gore, PhD, [place]

## 2014-09-13 NOTE — Progress Notes (Signed)
Progress Note from the Palliative Medicine Team at [place]:  -meet at bedside with husband and daughter in law  -continued conversation with patient regarding diagnosis, prognosis, treatment options, GOC and disposition options  -at this time patient and family desire continued treatment in hopes of  improvement, ultimately home is desired when medically stable and family would be open to hospice benefit if eligible.    -discussed oximyzer as O2 delivery system at home, patient report increased comfort in ability to talk and take sips with this devise      Objective: No Known Allergies Scheduled Meds: . acetaZOLAMIDE  250 mg Intravenous Q8H  . antiseptic oral rinse  7 mL Mouth Rinse q12n4p  . ARIPiprazole  2 mg Oral Daily  . ceFEPime (MAXIPIME) IV  1 g Intravenous 3 times per day  . chlorhexidine  15 mL Mouth Rinse BID  . enoxaparin (LOVENOX) injection  40 mg Subcutaneous Q24H  . feeding supplement (RESOURCE BREEZE)  1 Container Oral TID BM  . ferrous sulfate  325 mg Oral BID WC  . FLUoxetine  40 mg Oral BID  . folic acid  1 mg Oral Daily  . furosemide  40 mg Intravenous Q6H  . insulin aspart  0-15 Units Subcutaneous 6 times per day  . methylPREDNISolone (SOLU-MEDROL) injection  80 mg Intravenous Q12H  . OxyCODONE  30 mg Oral 3 times per day  . pantoprazole  40 mg Oral Daily  . polyethylene glycol  17 g Oral Daily  . potassium chloride  40 mEq Oral TID  . senna-docusate  2 tablet Oral BID  . simvastatin  40 mg Oral QHS  . sodium chloride  10-40 mL Intracatheter Q12H  . tiotropium  18 mcg Inhalation Daily   Continuous Infusions: . sodium chloride 10 mL/hr at 09/13/14 0600   PRN Meds:.albuterol, ALPRAZolam, ondansetron (ZOFRAN) IV, oxyCODONE, sodium chloride  BP 113/52 mmHg  Pulse 81  Temp(Src) 97.6 F (36.4 C) (Oral)  Resp 20  Ht 5\' 5"  (1.651 m)  Wt 88.3 kg (194 lb 10.7 oz)  BMI 32.39 kg/m2  SpO2 94%   PPS:30 % at best  Pain  Score:denies    Intake/Output Summary (Last 24 hours) at 09/13/14 1415 Last data filed at 09/13/14 1200  Gross per 24 hour  Intake    650 ml  Output   4020 ml  Net  -3370 ml      Physical Exam:  General: Chronically ill appearing, dyspneic on minimal exertion HEENT:  moist buccal membranes Chest:   Diminished throughout CVS: RRR Abdomen: soft NT +BS Neuro: alert and oriented X3   Labs: CBC    Component Value Date/Time   WBC 13.9* 09/13/2014 0415   WBC 8.6 09/01/2014 0835   RBC 3.79* 09/13/2014 0415   RBC 3.82 09/01/2014 0835   RBC 3.64* 08/15/2014 0425   HGB 10.3* 09/13/2014 0415   HGB 10.6* 09/01/2014 0835   HCT 32.9* 09/13/2014 0415   HCT 34.0* 09/01/2014 0835   PLT 313 09/13/2014 0415   PLT 299 09/01/2014 0835   MCV 86.8 09/13/2014 0415   MCV 89.0 09/01/2014 0835   MCH 27.2 09/13/2014 0415   MCH 27.7 09/01/2014 0835   MCHC 31.3 09/13/2014 0415   MCHC 31.2* 09/01/2014 0835   RDW 14.1 09/13/2014 0415   RDW 14.2 09/01/2014 0835   LYMPHSABS 1.1 09/06/2014 1208   LYMPHSABS 1.2 09/01/2014 0835   MONOABS 1.0 09/06/2014 1208   MONOABS 0.6 09/01/2014 0835   EOSABS  0.1 09/06/2014 1208   EOSABS 0.0 09/01/2014 0835   BASOSABS 0.0 09/06/2014 1208   BASOSABS 0.0 09/01/2014 0835    BMET    Component Value Date/Time   NA 135 09/13/2014 0415   NA 138 09/01/2014 0835   K 4.3 09/13/2014 0415   K 4.8 09/01/2014 0835   CL 88* 09/13/2014 0415   CO2 40* 09/13/2014 0415   CO2 25 09/01/2014 0835   GLUCOSE 158* 09/13/2014 0415   GLUCOSE 120 09/01/2014 0835   BUN 27* 09/13/2014 0415   BUN 13.5 09/01/2014 0835   CREATININE 0.63 09/13/2014 0415   CREATININE 0.8 09/01/2014 0835   CALCIUM 8.6 09/13/2014 0415   CALCIUM 8.9 09/01/2014 0835   GFRNONAA >90 09/13/2014 0415   GFRAA >90 09/13/2014 0415    CMP     Component Value Date/Time   NA 135 09/13/2014 0415   NA 138 09/01/2014 0835   K 4.3 09/13/2014 0415   K 4.8 09/01/2014 0835   CL 88* 09/13/2014 0415   CO2 40*  09/13/2014 0415   CO2 25 09/01/2014 0835   GLUCOSE 158* 09/13/2014 0415   GLUCOSE 120 09/01/2014 0835   BUN 27* 09/13/2014 0415   BUN 13.5 09/01/2014 0835   CREATININE 0.63 09/13/2014 0415   CREATININE 0.8 09/01/2014 0835   CALCIUM 8.6 09/13/2014 0415   CALCIUM 8.9 09/01/2014 0835   PROT 6.6 09/06/2014 1208   PROT 6.5 09/01/2014 0835   ALBUMIN 2.5* 09/06/2014 1208   ALBUMIN 2.5* 09/01/2014 0835   AST 21 09/06/2014 1208   AST 17 09/01/2014 0835   ALT 12 09/06/2014 1208   ALT 20 09/01/2014 0835   ALKPHOS 137* 09/06/2014 1208   ALKPHOS 172* 09/01/2014 0835   BILITOT 0.9 09/06/2014 1208   BILITOT 0.60 09/01/2014 0835   GFRNONAA >90 09/13/2014 0415   GFRAA >90 09/13/2014 0415     Assessment and Plan: 1. Code Status:Full code          Encouraged to consider DNR/DNI 2. Symptom Control: Dyspnea  -discussed with Dr Nelda Marseille use of oximyzer for high flow O2 for comfort and anticipation of dc home   3. Psycho/Social:  Emotional support offered to patient and family. 4. Spiritual  Chaplain involved 5. Disposition: Pending outcomes, patient would like to return home when medically stable and care needs in place  Patient Documents Completed or Given: Document Given Completed  Advanced Directives Pkt    MOST X   DNR    Gone from My Sight    Hard Choices      Time In Time Out Total Time Spent with Patient Total Overall Time  1300 1340 40 min 40 min    Greater than 50%  of this time was spent counseling and coordinating care related to the above assessment and plan.  [name] NP  Palliative Medicine Team Team Phone # [phone] Pager [phone]  Discussed with Dr Rama 1

## 2014-09-13 NOTE — Care Management Note (Addendum)
    Page 1 of 1   09/13/2014     2:09:10 PM CARE MANAGEMENT NOTE 09/13/2014  Patient:  Emily West,Emily West   Account Number:  [id number]  Date Initiated:  09/13/2014  Documentation initiated by:  [facility]  Subjective/Objective Assessment:   adm: SOB/recent dx stage IV NSCLC admitted 12/23 for hypoxic respiratory failure     Action/Plan:   discharge planning   Anticipated DC Date:  09/14/2014   Anticipated DC Plan:        [place]  CM consult      Choice offered to / List presented to:  C-1 Patient           Status of service:  In process, will continue to follow Medicare Important Message given?   (If response is "NO", the following Medicare IM given date fields will be blank) Date Medicare IM given:   Medicare IM given by:   Date Additional Medicare IM given:   Additional Medicare IM given by:    Discharge Disposition:    Per UR Regulation:    If discussed at Long Length of Stay Meetings, dates discussed:    Comments:  09/13/14 CM HAS RECEIVED CALL FROM PALLIATIVE AND PT IS NOT BEEN REFERRED FOR HOSPICE AT [place]; goc HAVE NOT BEEN ESTABLISHED AND ORDERS HAVE NOT BEEN PLACED FOR PALLIATIVE; CALL PLACED TO Emily West to apologize for misinterpretation.  Cm will continue to follow for disposition.  Emily West, BSN, [place]. 09/13/14 10:00 CM met with pt and pt's daughter in room to discuss oximyzers (from [facility]).  Pt's husband, Emily West,  had stepped out and can be reached at [phone]. Disposition was discussed and pt and daughter informed CM of choice to go home with hospice.  Family had stated they had a meeting with Emily West.  Pt chooses Hosp[facility] and [place].  Address and contact information verified by pt.  Pt is oxygen dependent and CM received call for oxymizers to be brought to room from [facility].  CM brought both styles to room and RN called Respiratory to place.  CM called Referral to [place] and spoke with Emily West who  states she will relay information to [place].  No other CM needs were communicated.  Emily West, BSN, CM [phone].

## 2014-09-13 NOTE — Progress Notes (Signed)
CRITICAL VALUE ALERT  Critical value received: CO2 - 40  Date of notification:  09/13/14  Time of notification:  0523  Critical value read back:Yes.    Nurse who received alert:  T.Jaremy Nosal,RN  MD notified (1st page):  K.Kirby, NP  Time of first page: [phone]

## 2014-09-13 NOTE — Progress Notes (Signed)
Name: Emily West MRN: [id number] DOB: 09-22-1950    ADMISSION DATE:  09/06/2014 CONSULTATION DATE:  09/06/2014  REFERRING MD :  Karleen Hampshire  CHIEF COMPLAINT:  SOB  BRIEF PATIENT DESCRIPTION: 63 year old female with recent dx stage IV NSCLC admitted 12/23 for hypoxic respiratory failure 2/2 presumed CAP. Despite high flow O2 she remained hypoxic. PCCM asked to consult. - was to start chemo on 12/30 PRIOR THERAPY:  1) Stereotactic radiotherapy to the metastatic bone lesions at T10 and L3 under Dr. [name] and Dr. Vertell Limber. 2) stereotactic radiotherapy to a solitary brain lesion under the care of Dr. [name].  SIGNIFICANT EVENTS  12/25 bipap overnight  STUDIES:  Echo 12/1 > LVEF 55-60% CT chest 12/23 > bilateral GGO with bilateral pleural effusions. Pulm edema vs hypersensitivity pneumonitis. Pathologic T10 vertebral body fx.    SUBJECTIVE: Remains dyspneic, increased WOB off BIPAP even with diureses. Afebrile Denies pain  VITAL SIGNS: Temp:  [97.6 F (36.4 C)-98.6 F (37 C)] 97.7 F (36.5 C) (12/30 0800) Pulse Rate:  [69-81] 69 (12/30 0800) Resp:  [18-26] 22 (12/30 0800) BP: (107-142)/(46-66) 125/60 mmHg (12/30 0800) SpO2:  [94 %-100 %] 97 % (12/30 0800) FiO2 (%):  [100 %] 100 % (12/29 1956)  PHYSICAL EXAMINATION:  Gen: resting comfortably on 100% NRB. HEENT: NCAT, EOMi. PULM: Crackles Bilaterally scattered. CV: RRR, no mgr AB: BS+, soft, nontender Ext: warm no edema Neuro: A&Ox4, maew  Recent Labs Lab 09/11/14 0352 09/12/14 0350 09/13/14 0415  NA 139 138 135  K 3.2* 3.6 4.3  CL 101 95* 88*  CO2 31 35* 40*  BUN 27* 27* 27*  CREATININE 0.56 0.56 0.63  GLUCOSE 145* 159* 158*    Recent Labs Lab 09/11/14 0352 09/12/14 0350 09/13/14 0415  HGB 10.2* 10.3* 10.3*  HCT 32.2* 32.3* 32.9*  WBC 12.7* 13.3* 13.9*  PLT 322 305 313   Dg Chest Port 1 View  09/12/2014   CLINICAL DATA:  Respiratory failure.  EXAM: PORTABLE CHEST - 1 VIEW  COMPARISON:   09/03/2014.  FINDINGS: Mediastinum and hilar structures are normal. Heart size is stable. Diffuse severe bilateral pulmonary interstitial infiltrates are again noted without change. Basilar atelectasis present. No pleural effusion or pneumothorax.  IMPRESSION: 1. Persistent severe bilateral interstitial infiltrates. No significant change from prior exam. 2. Bibasilar atelectasis.   Electronically Signed   By: Marcello Moores  Register   On: 09/12/2014 07:49   ASSESSMENT / PLAN:  PULMONARY A: Acute hypoxemic respiratory failure. 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 - Discussed with Dr. Inda Merlin, nothing to be offered at this time while patient is on such high flow O2. - Titrate FiO2 to maintatin SpO2 > 90%. - Solumedrol 80 q 8h. - Incentive spirometry. - PRN albuterol. - Trend CXR & FIO2 for improvement. - High risk intubation -Obtain BAL cytology if intubated. - Add spiriva. - Continue to diurese. - Will attempt to high flow [place] or oxymizer in an attempt to facilitate going discharge home with home O2. - Will reculture sputum today, if candida is positive again then will treat. - Diureses as ordered.  CARDIOVASCULAR A:  Hypotension resolved H/o HTN Chronic diastolic heart failure, doubt exacerbation, low BNP P:  - Telemetry. - Holding home anti-hypertensive's for now (irbesartan). - Continue statin.  RENAL A:   No acute  issues P:   - Lasix 40 mg IV q6 x3 doses. - Replace electrolytes as indicated. - BMET iN AM. - Acetazolamide 250 mg IV q8 x2 doses.  GASTROINTESTINAL A:   No acute issue P:   - Advance diet as able. - Continue home PPI.  HEMATOLOGIC A:   Anemia, chronic P:  - Follow CBC. - Transfusion per ICU protocol.  INFECTIOUS A:   Severe sepsis 2/2 HCAP > improving  P:     BCx2 12/23 >>>NTD Sputum 12/23 >>> Candida albican, moderate Abx: cefepime, start date 12/23>>>  Abx: vancomycin, start date 12/23>>>12/29  - Trend WBC and fever curve, low pct reassuring. - D/C vancomycin today, continue cefepime for now.  ENDOCRINE A:   No acute issues    P:   - Follow glucose on chemistries.  NEUROLOGIC A:   Anxiety Depression P:   - Hold sedating meds for now  (xanax, fluoxetine, oxycodone).  FAMILY  - Updates: Patient updated at length bedside.  - Inter-disciplinary family meet or Palliative Care meeting due by:  12/30  The patient is critically ill with multiple organ systems failure and requires high complexity decision making for assessment and support, frequent evaluation and titration of therapies, application of advanced monitoring technologies and extensive interpretation of multiple databases.   Critical Care Time devoted to patient care services described in this note is  35  Minutes. This time reflects time of care of this signee Dr Jennet Maduro. This critical care time does not reflect procedure time, or teaching time or supervisory time of PA/NP/Med student/Med Resident etc but could involve care discussion time.  Rush Farmer, M.D. [facility] Pulmonary/Critical Care Medicine. Pager: [phone]. After hours pager: [phone].

## 2014-09-13 NOTE — Progress Notes (Signed)
Attempted 2 types of oximyzer's with pt. Pt tolerated the original design better than the oximyzer with the medallion. Sat's ranging from 88-91% at rest with a 8 lpm flow of oxygen. RT will continue to monitor.

## 2014-09-13 NOTE — Evaluation (Signed)
Physical Therapy Evaluation Patient Details Name: Azula [facility]app[facility] MRN: [id number] DOB: 09/09/1951 Today's Date: 09/13/2014   History of Present Illness  63 yo female admitted with acute hypoxic resp failure, sepsis. 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   Clinical Impression  On eval, pt required Mod assist for mobility-able to sit EOB ~5 minutes. Pt did not feel she could attempt transfer or standing this session. O2 sats dropped to 86% on 8L oximizer. 1-2 minutes for recovery to 91%. Assisted pt back to supine. Will continue to follow and work with pt as tolerated.     Follow Up Recommendations Supervision/Assistance - 24 hour;Home health PT    Equipment Recommendations  Rolling walker with 5" wheels;Hospital bed;3in1 (PT);Wheelchair (measurements PT);Wheelchair cushion (measurements PT)    Recommendations for Other Services OT consult     Precautions / Restrictions Precautions Precautions: Fall;Back Precaution Comments: back precautions for safety; logroll Required Braces or Orthoses: Spinal Brace Spinal Brace: Thoracolumbosacral orthotic;Applied in sitting position (did not use brace on eval. Pt only able to tolerate sitting EOB for a short period of time-did not stand) Restrictions Weight Bearing Restrictions: No      Mobility  Bed Mobility Overal bed mobility: Needs Assistance Bed Mobility: Rolling;Sidelying to Sit;Sit to Sidelying Rolling: Min assist Sidelying to sit: Min assist     Sit to sidelying: Mod assist General bed mobility comments: Assist for trunk and LEs. Increased time. VCS safety, technique.   Transfers                 General transfer comment: NT-pt did not feel she could attempt on today  Ambulation/Gait                Stairs            Wheelchair Mobility    Modified Rankin (Stroke Patients Only)       Balance Overall balance assessment: Needs assistance Sitting-balance  support: Bilateral upper extremity supported;Feet supported Sitting balance-Leahy Scale: Good Sitting balance - Comments: Sat EOB ~5 minutes. Min guard assist.                                      Pertinent Vitals/Pain Pain Assessment: No/denies pain    Home Living Family/patient expects to be discharged to:: Private residence Living Arrangements: Spouse/significant other (husband works)   Type of Home: [employer] Access: Stairs to enter   Technical brewer of Steps: 3+5 Home Layout: Multi-level;Able to live on main level with bedroom/bathroom Home Equipment:  (pt unsure of DME)      Prior Function Level of Independence: Independent               Hand Dominance        Extremity/Trunk Assessment               Lower Extremity Assessment: Generalized weakness      Cervical / Trunk Assessment: Normal  Communication      Cognition Arousal/Alertness: Awake/alert Behavior During Therapy: WFL for tasks assessed/performed Overall Cognitive Status: Within Functional Limits for tasks assessed                      General Comments      Exercises        Assessment/Plan    PT Assessment Patient needs continued PT services  PT Diagnosis Difficulty walking;Generalized weakness  PT Problem List Decreased strength;Decreased activity tolerance;Decreased balance;Decreased mobility;Decreased knowledge of use of DME;Pain  PT Treatment Interventions DME instruction;Gait training;Functional mobility training;Therapeutic activities;Therapeutic exercise;Patient/family education;Balance training   PT Goals (Current goals can be found in the Care Plan section) Acute Rehab PT Goals Patient Stated Goal: Home with husband PT Goal Formulation: With patient Time For Goal Achievement: 09/27/14 Potential to Achieve Goals: Fair    Frequency Min 3X/week   Barriers to discharge        Co-evaluation               End of Session  Equipment Utilized During Treatment: Oxygen Activity Tolerance: Patient limited by fatigue Patient left: in bed;with call bell/phone within reach           Time: 1451-1518 PT Time Calculation (min) (ACUTE ONLY): 27 min   Charges:   PT Evaluation $Initial PT Evaluation Tier I: 1 Procedure PT Treatments $Therapeutic Activity: 8-22 mins   PT G Codes:        Weston Anna, MPT Pager: [phone]

## 2014-09-14 ENCOUNTER — Inpatient Hospital Stay (HOSPITAL_COMMUNITY): Payer: BC Managed Care – PPO

## 2014-09-14 LAB — BASIC METABOLIC PANEL
ANION GAP: 8 (ref 5–15)
BUN: 29 mg/dL — ABNORMAL HIGH (ref 6–23)
CALCIUM: 8.7 mg/dL (ref 8.4–10.5)
CHLORIDE: 90 meq/L — AB (ref 96–112)
CO2: 32 mmol/L (ref 19–32)
CREATININE: 0.76 mg/dL (ref 0.50–1.10)
GFR, EST NON AFRICAN AMERICAN: 89 mL/min — AB (ref >90)
Glucose, Bld: 150 mg/dL — ABNORMAL HIGH (ref 70–99)
Potassium: 3.9 mmol/L (ref 3.5–5.1)
SODIUM: 130 mmol/L — AB (ref 135–145)

## 2014-09-14 LAB — CBC
HEMATOCRIT: 34.6 % — AB (ref 36.0–46.0)
HEMOGLOBIN: 11.4 g/dL — AB (ref 12.0–15.0)
MCH: 28 pg (ref 26.0–34.0)
MCHC: 32.9 g/dL (ref 30.0–36.0)
MCV: 85 fL (ref 78.0–100.0)
Platelets: 286 10*3/uL (ref 150–400)
RBC: 4.07 MIL/uL (ref 3.87–5.11)
RDW: 14.3 % (ref 11.5–15.5)
WBC: 17.2 10*3/uL — AB (ref 4.0–10.5)

## 2014-09-14 LAB — GLUCOSE, CAPILLARY
GLUCOSE-CAPILLARY: 117 mg/dL — AB (ref 70–99)
Glucose-Capillary: 103 mg/dL — ABNORMAL HIGH (ref 70–99)
Glucose-Capillary: 104 mg/dL — ABNORMAL HIGH (ref 70–99)
Glucose-Capillary: 137 mg/dL — ABNORMAL HIGH (ref 70–99)
Glucose-Capillary: 172 mg/dL — ABNORMAL HIGH (ref 70–99)
Glucose-Capillary: 170 mg/dL — ABNORMAL HIGH (ref 70–99)
Glucose-Capillary: 149 mg/dL — ABNORMAL HIGH (ref 70–99)

## 2014-09-14 LAB — EXPECTORATED SPUTUM ASSESSMENT W REFEX TO RESP CULTURE

## 2014-09-14 LAB — SODIUM, URINE, RANDOM: Sodium, Ur: 75 mmol/L

## 2014-09-14 LAB — MAGNESIUM: MAGNESIUM: 2.2 mg/dL (ref 1.5–2.5)

## 2014-09-14 LAB — PHOSPHORUS: PHOSPHORUS: 4.1 mg/dL (ref 2.3–4.6)

## 2014-09-14 LAB — EXPECTORATED SPUTUM ASSESSMENT W GRAM STAIN, RFLX TO RESP C

## 2014-09-14 MED ORDER — ROSUVASTATIN CALCIUM 10 MG PO TABS
10.0000 mg | ORAL_TABLET | Freq: Every day | ORAL | Status: DC
  Administered 2014-09-14 – 2014-09-20 (×7): 10 mg via ORAL
  Filled 2014-09-14 (×8): qty 1

## 2014-09-14 MED ORDER — STERILE WATER FOR INJECTION IJ SOLN
INTRAMUSCULAR | Status: AC
  Administered 2014-09-14: 10 mL
  Filled 2014-09-14: qty 10

## 2014-09-14 MED ORDER — FLUCONAZOLE IN SODIUM CHLORIDE 200-0.9 MG/100ML-% IV SOLN
200.0000 mg | Freq: Once | INTRAVENOUS | Status: DC
  Filled 2014-09-14: qty 100

## 2014-09-14 MED ORDER — POTASSIUM CHLORIDE CRYS ER 20 MEQ PO TBCR
40.0000 meq | EXTENDED_RELEASE_TABLET | Freq: Three times a day (TID) | ORAL | Status: AC
  Administered 2014-09-14 (×2): 40 meq via ORAL
  Filled 2014-09-14 (×2): qty 2

## 2014-09-14 MED ORDER — SODIUM CHLORIDE 3 % IN NEBU
3.0000 mL | INHALATION_SOLUTION | RESPIRATORY_TRACT | Status: AC | PRN
  Filled 2014-09-14: qty 4

## 2014-09-14 MED ORDER — FUROSEMIDE 10 MG/ML IJ SOLN
40.0000 mg | Freq: Four times a day (QID) | INTRAMUSCULAR | Status: AC
  Administered 2014-09-14 (×3): 40 mg via INTRAVENOUS
  Filled 2014-09-14 (×3): qty 4

## 2014-09-14 MED ORDER — FLUCONAZOLE IN SODIUM CHLORIDE 200-0.9 MG/100ML-% IV SOLN
200.0000 mg | INTRAVENOUS | Status: DC
  Administered 2014-09-14 – 2014-09-15 (×2): 200 mg via INTRAVENOUS
  Filled 2014-09-14 (×3): qty 100

## 2014-09-14 MED ORDER — ACETAZOLAMIDE SODIUM 500 MG IJ SOLR
250.0000 mg | Freq: Three times a day (TID) | INTRAMUSCULAR | Status: AC
  Administered 2014-09-14 (×2): 250 mg via INTRAVENOUS
  Filled 2014-09-14 (×2): qty 500

## 2014-09-14 MED ORDER — FLUCONAZOLE 100MG IVPB: 100.0000 mg | INTRAVENOUS | Status: DC

## 2014-09-14 MED ORDER — ZOLPIDEM TARTRATE 5 MG PO TABS
5.0000 mg | ORAL_TABLET | Freq: Once | ORAL | Status: AC
  Administered 2014-09-14: 5 mg via ORAL
  Filled 2014-09-14: qty 1

## 2014-09-14 NOTE — Progress Notes (Addendum)
ANTIBIOTIC CONSULT NOTE - Follow up  Pharmacy Consult for Cefepime, Diflucan Indication: rule out pneumonia  No Known Allergies  Patient Measurements: Height: 5\' 5"  (165.1 cm) Weight: 194 lb 10.7 oz (88.3 kg) IBW/kg (Calculated) : 57  Vital Signs: Temp: 97.4 F (36.3 C) (12/31 0800) Temp Source: Oral (12/31 0800) BP: 109/45 mmHg (12/31 0610) Pulse Rate: 80 (12/31 0610) Intake/Output from previous day: 12/30 0701 - 12/31 0700 In: 560 [P.O.:180; I.V.:230; IV Piggyback:150] Out: 1930 [Urine:1930] Intake/Output from this shift:    Labs:  Recent Labs  09/12/14 0350 09/13/14 0415 09/14/14 0359  WBC 13.3* 13.9* 17.2*  HGB 10.3* 10.3* 11.4*  PLT 305 313 286  CREATININE 0.56 0.63 0.76   Estimated Creatinine Clearance: 81 mL/min (by C-G formula based on Cr of 0.76). No results for input(s): VANCOTROUGH, VANCOPEAK, VANCORANDOM, GENTTROUGH, GENTPEAK, GENTRANDOM, TOBRATROUGH, TOBRAPEAK, TOBRARND, AMIKACINPEAK, AMIKACINTROU, AMIKACIN in the last 72 hours.   Microbiology: Recent Results (from the past 720 hour(s))  Blood culture (routine x 2)     Status: None   Collection Time: 09/06/14  3:32 PM  Result Value Ref Range Status   Specimen Description BLOOD LEFT FOREARM  Final   Special Requests BOTTLES DRAWN AEROBIC AND ANAEROBIC 5CC  Final   Culture  Setup Time   Final    09/06/2014 20:48 Performed at [facility]    Culture   Final    NO GROWTH 5 DAYS Performed at [facility]    Report Status 09/12/2014 FINAL  Final  Blood culture (routine x 2)     Status: None   Collection Time: 09/06/14  3:33 PM  Result Value Ref Range Status   Specimen Description BLOOD LEFT ANTECUBITAL  Final   Special Requests BOTTLES DRAWN AEROBIC AND ANAEROBIC 5CC  Final   Culture  Setup Time   Final    09/06/2014 20:48 Performed at [facility]   Final    NO GROWTH 5 DAYS Performed at [facility]    Report Status 09/12/2014 FINAL  Final  MRSA  PCR Screening     Status: None   Collection Time: 09/06/14  5:59 PM  Result Value Ref Range Status   MRSA by PCR NEGATIVE NEGATIVE Final    Comment:        The GeneXpert MRSA Assay (FDA approved for NASAL specimens only), is one component of a comprehensive MRSA colonization surveillance program. It is not intended to diagnose MRSA infection nor to guide or monitor treatment for MRSA infections.   Culture, sputum-assessment     Status: None   Collection Time: 09/10/14  2:24 PM  Result Value Ref Range Status   Specimen Description SPUTUM  Final   Special Requests NONE  Final   Sputum evaluation   Final    THIS SPECIMEN IS ACCEPTABLE. RESPIRATORY CULTURE REPORT TO FOLLOW.   Report Status 09/10/2014 FINAL  Final  Culture, respiratory (NON-Expectorated)     Status: None   Collection Time: 09/10/14  2:24 PM  Result Value Ref Range Status   Specimen Description SPUTUM  Final   Special Requests NONE  Final   Gram Stain   Final    FEW WBC PRESENT, PREDOMINANTLY PMN NO SQUAMOUS EPITHELIAL CELLS SEEN FEW YEAST RARE GRAM POSITIVE COCCI IN PAIRS    Culture   Final    MODERATE CANDIDA ALBICANS Performed at [facility]    Report Status 09/12/2014 FINAL  Final  Clostridium Difficile by PCR  Status: None   Collection Time: 09/11/14  9:00 PM  Result Value Ref Range Status   C difficile by pcr NEGATIVE NEGATIVE Final    Comment: Performed at [facility]   Assessment: 63 yo female presents with abrupt onset of SHOB. She was recently diagnosed with metastatic stage IV lung cancer during previous admission 08/08/14-08/22/14. She also notes pain in her right hip after felt something pop after getting out of the car yesterday. CXR shows significant increase in bilateral infiltrates, a portion which may be related to the patient's underlying neoplastic history. CTA reveals no PE but ground glass appearance with infectious vs inflammatory etiology.  12/23 >> Zosyn x  1 12/23 >> Vancomycin >> 12/29 12/23 >> Cefepime >> 12/31 >> Diflucan >>  Tmax: afeb since fever at time of admission WBC: elevated.(Solumedrol) Renal: SCr improved but below baseline; CrCl 81CG/N(using SCr = 0.8) PCT < 0.5, trending down as of 12/25  12/23 Blood x 2: neg 12/24 influenza: neg 12/23 MRSA: neg Legionella Ag: IP S. pneumo Ag: neg 12/27 sputum: rare GPC, mod candida albicans 12/28 Cdiff PCR: neg 12/30 sputum: not producing sputum  Goal of Therapy:  Appropriate antibiotic dosing for renal function; eradication of infection  Plan:  Cont Cefepime 1g IV q8h. Day 8. Stop soon? Start Diflucan 200mg  IV q24h for possible candidal pneumonia. F/u renal function, clinical course, cultures as available.  Romeo Rabon, PharmD, pager [phone]. 09/14/2014,9:29 AM.

## 2014-09-14 NOTE — Progress Notes (Signed)
Name: Emily West [facility] MRN: [id number] DOB: 10/04/1950    ADMISSION DATE:  09/06/2014 CONSULTATION DATE:  09/06/2014  REFERRING MD :  Karleen Hampshire  CHIEF COMPLAINT:  SOB  BRIEF PATIENT DESCRIPTION: 63 year old female with recent dx stage IV NSCLC admitted 12/23 for hypoxic respiratory failure 2/2 presumed CAP. Despite high flow O2 she remained hypoxic. PCCM asked to consult. - was to start chemo on 12/30 PRIOR THERAPY:  1) Stereotactic radiotherapy to the metastatic bone lesions at T10 and L3 under Dr. [name] and Dr. Vertell Limber. 2) stereotactic radiotherapy to a solitary brain lesion under the care of Dr. [name].  SIGNIFICANT EVENTS  12/25 bipap overnight  STUDIES:  Echo 12/1 > LVEF 55-60% CT chest 12/23 > bilateral GGO with bilateral pleural effusions. Pulm edema vs hypersensitivity pneumonitis. Pathologic T10 vertebral body fx.    SUBJECTIVE: Remains dyspneic, increased WOB off BIPAP even with diureses. Afebrile Denies pain  VITAL SIGNS: Temp:  [97.4 F (36.3 C)-98.4 F (36.9 C)] 97.4 F (36.3 C) (12/31 0800) Pulse Rate:  [76-86] 80 (12/31 0610) Resp:  [15-24] 15 (12/31 0610) BP: (93-114)/(39-58) 109/45 mmHg (12/31 0610) SpO2:  [86 %-100 %] 98 % (12/31 0610)  PHYSICAL EXAMINATION:  Gen: resting comfortably on oxymizer at 8L. HEENT: NCAT, EOMi. PULM: Crackles Bilaterally scattered. CV: RRR, no mgr AB: BS+, soft, nontender Ext: warm no edema Neuro: A&Ox4, maew  Recent Labs Lab 09/12/14 0350 09/13/14 0415 09/14/14 0359  NA 138 135 130*  K 3.6 4.3 3.9  CL 95* 88* 90*  CO2 35* 40* 32  BUN 27* 27* 29*  CREATININE 0.56 0.63 0.76  GLUCOSE 159* 158* 150*   Recent Labs Lab 09/12/14 0350 09/13/14 0415 09/14/14 0359  HGB 10.3* 10.3* 11.4*  HCT 32.3* 32.9* 34.6*  WBC 13.3* 13.9* 17.2*  PLT 305 313 286   Dg Chest Port 1 View  09/14/2014   CLINICAL DATA:  Respiratory failure.  EXAM: PORTABLE CHEST - 1 VIEW  COMPARISON:  09/13/2014.  FINDINGS: Right PICC line in  stable position. Mediastinal structures normal. Heart size stable. Diffuse bilateral pulmonary interstitial infiltrates are present and unchanged. No pleural effusion or pneumothorax  IMPRESSION: 1.  PICC line is in position.  2.  Diffuse unchanged bilateral pulmonary interstitial infiltrates.   Electronically Signed   By: Marcello Moores  Register   On: 09/14/2014 07:18   Dg Chest Port 1 View  09/13/2014   CLINICAL DATA:  Respiratory difficulty  EXAM: PORTABLE CHEST - 1 VIEW  COMPARISON:  September 12, 2014  FINDINGS: Central catheter tip is in the superior vena cava near the cavoatrial junction. No pneumothorax. There is widespread interstitial edema. There is underlying emphysematous change. There is no frank airspace consolidation. Heart size is normal. Pulmonary vascularity is within normal limits. No adenopathy. No bone lesions.  IMPRESSION: Central catheter tip in superior vena cava. No pneumothorax. Widespread interstitial edema superimposed on emphysematous change. There may be a degree of superimposed ARDS.   Electronically Signed   By: Lowella Grip M.D.   On: 09/13/2014 09:31   ASSESSMENT / PLAN:  PULMONARY A: Acute hypoxemic respiratory failure. 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 P:   - D/C BiPAP. - Discussed with Dr. Inda Merlin, nothing to be offered at this time while patient is on  such high flow O2. - Titrate FiO2 to maintatin SpO2 > 90%. - Continue Solumedrol 80 q 8h. - Incentive spirometry. - PRN albuterol. - Trend CXR & FIO2 for improvement. - Continue spiriva. - Continue to diurese. - Continue high flow Maynard via oxymizer in an attempt to facilitate discharge home with home O2. - Will reculture sputum today, if candida  is positive again then will treat. - Diureses as ordered.  CARDIOVASCULAR A:  Hypotension resolved H/o HTN Chronic diastolic heart failure, doubt exacerbation, low BNP P:  - Telemetry. - Holding home anti-hypertensive's for now (irbesartan). - Continue statin.  RENAL A:   Fluid overloaded and hyponatremia. P:   - Lasix 40 mg IV q6 x3 doses. - Replace electrolytes as indicated. - BMET iN AM. - Acetazolamide 250 mg IV q8 x2 doses. - Check urine Na, dropping even with diureses.  GASTROINTESTINAL A:   No acute issue P:   - Advance diet as able. - Continue home PPI.  HEMATOLOGIC A:   Anemia, chronic P:  - Follow CBC. - Transfusion per ICU protocol.  INFECTIOUS A:   Severe sepsis 2/2 HCAP > improving  P:   BCx2 12/23 >>>NTD Sputum 12/23 >>> Candida albican, moderate Sputum 12/31>>> Abx: cefepime, start date 12/23>>>  Abx: vancomycin, start date 12/23>>>12/29  - Trend WBC and fever curve, low pct reassuring. - D/C vancomycin 12/30, continue cefepime for now. - Repeat sputum culture, if candida is present then will continue diflucan, if not then will d/c.  ENDOCRINE A:   No acute issues    P:   - Follow glucose on chemistries.  NEUROLOGIC A:   Anxiety Depression P:   - Hold sedating meds for now  (xanax, fluoxetine, oxycodone).  FAMILY  - Updates: Patient and husband updated at length bedside.  The patient is critically ill with multiple organ systems failure and requires high complexity decision making for assessment and support, frequent evaluation and titration of therapies, application of advanced monitoring technologies and extensive interpretation of multiple databases.   Critical Care Time devoted to patient care services described in this note is  35  Minutes. This time reflects time of care of this signee Dr Jennet Maduro. This critical care time does not reflect procedure time, or teaching time or supervisory time of PA/NP/Med student/Med  Resident etc but could involve care discussion time.  Rush Farmer, M.D. [facility] Pulmonary/Critical Care Medicine. Pager: [phone]. After hours pager: [phone].

## 2014-09-14 NOTE — Progress Notes (Signed)
Progress Note   Emily West [facility] TKW:[id number] DOB: May 14, 1951 DOA: 09/06/2014 PCP: [name], MD   Brief Narrative:   Emily West is an 63 y.o. female with h/o recent diagnosis of stage 4 NSCLC , admitted for acute hypoxic respiratory failure presumed to be infectious. She was started on high dose steroids and IV antibiotics and has been on NRB since admission. PCCM and oncology consulted. Patient's husband requested palliative care meeting for goals of care.   Assessment/Plan:   Principal problem:  Severe sepsis secondary to HCAP / Acute hypoxic respiratory failure -Admitted to step down, still with high oxygen requirement.  -Has pneumonitis from infectious process versus lymphangitic spread of cancer.  Treating her for both for health care associated pneumonia with IV antibiotics and IV steroids for inflammation. Spiriva added.  Continue diuresis.  I&O balance -1.3 L. -Minimal pleural effusions and worsened opacities on CXR. Started her on IV lasix on 12/27. F/U CXR 09/13/14 unchanged. - Continue Incentive spirometry, Nebulizer treatments if needed. Keep sats greater than 90%.  -Influenza PCR was negative. BNP is 227.  -PCCM following.  -Currently she is full code.   Active problems:  NSCLC with bone and brain mets: - s/p palliative radiation treatment. Chemo scheduled for 12/30. But on hold for now.  -Dr [name] on board.  Seen by palliative care team as well.  Still wishes Full Code status.  Hypokalemia  -Repleted as needed.  Anemia -Normocytic and at baseline.   Mild leukocytosis: -Possibly from the infection.   DVT prophlylaxis -Continue Lovenox.    Code Status: Full code. Family Communication:husband Daughter-in-law at bedside 09/13/14. Disposition Plan: Home when stable.    IV Access:    PICC placed 09/12/14   Procedures and diagnostic studies:   Dg Chest 1 View 09/06/2014: Significant increase in bilateral infiltrates a portion  which may be related to the patient's underlying neoplastic history.     Dg Hip Complete Right 09/06/2014: No acute abnormality noted.     Ct Angio Chest Pe W/cm &/or Wo Cm 09/06/2014: 1. Interval development of bilateral diffuse patchy airspace disease with ground-glass opacities and bilateral pleural effusions. Findings most concerning for an infectious or inflammatory process including hypersensitivity pneumonitis versus pulmonary edema. 2. Findings concerning for a severe pathologic T10 vertebral body compression fracture. 3. Mediastinal and right hilar lymphadenopathy persists.    Dg Chest Port 1 View 09/07/2014: Progression of diffuse bilateral airspace disease consistent with edema or infection.     Dg Chest Port 1 View 09/08/2014:  No significant interval change in bilateral airspace disease when compared with the prior exam.     Dg Chest Port 1 View 09/09/2014: Diffuse airspace disease stable.  Improved right pleural effusion.     Dg Chest Port 1 View 09/10/2014: Stable diffuse bilateral mixed interstitial airspace process with slight worsening more focal airspace density in the right base. Findings are likely due to infection. Possible small left effusion.     Dg Chest Port 1 View 09/11/2014: 1. Persistent severe bilateral interstitial infiltrates without significant change from prior exam.  2.  Bibasilar atelectasis.     Dg Chest Port 1 View 09/12/2014: 1. Persistent severe bilateral interstitial infiltrates. No significant change from prior exam. 2. Bibasilar atelectasis.     Medical Consultants:    Palliative Care  Oncology  PCCM  Anti-Infectives:    Vancomycin 09/06/14---> 09/12/14  Zosyn 09/06/14---> 09/06/14  Cefepime 09/06/14--->   Subjective:   Emily West [facility] is still having dyspnea/activity  intolerance, but did work with PT yesterday.  Still desaturates at times.  Had some left sided abdominal pain and nausea without vomiting.  Objective:    Filed  Vitals:   09/14/14 0200 09/14/14 0400 09/14/14 0600 09/14/14 0610  BP: 104/48 109/39 93/46 109/45  Pulse: 86 80 77 80  Temp:  98.4 F (36.9 C)    TempSrc:  Axillary    Resp: 24 20 19 15   Height:      Weight:      SpO2: 93% 100% 97% 98%    Intake/Output Summary (Last 24 hours) at 09/14/14 0738 Last data filed at 09/14/14 0618  Gross per 24 hour  Intake    560 ml  Output   1930 ml  Net  -1370 ml    Exam: Gen:  NAD, NRM on with tachypnea Cardiovascular:  RRR, No M/R/G Respiratory:  Lungs diminished with bibasilar crackles Gastrointestinal:  Abdomen soft, NT/ND, + BS Extremities:  No C/E/C   Data Reviewed:    Labs: Basic Metabolic Panel:  Recent Labs Lab 09/10/14 0359 09/11/14 0352 09/12/14 0350 09/13/14 0415 09/14/14 0359  NA 140 139 138 135 130*  K 4.0 3.2* 3.6 4.3 3.9  CL 110 101 95* 88* 90*  CO2 27 31 35* 40* 32  GLUCOSE 151* 145* 159* 158* 150*  BUN 25* 27* 27* 27* 29*  CREATININE 0.47* 0.56 0.56 0.63 0.76  CALCIUM 8.7 8.4 8.4 8.6 8.7  MG  --   --  2.0 2.1 2.2  PHOS  --   --  3.1 4.0 4.1   GFR Estimated Creatinine Clearance: 81 mL/min (by C-G formula based on Cr of 0.76). Liver Function Tests: No results for input(s): AST, ALT, ALKPHOS, BILITOT, PROT, ALBUMIN in the last 168 hours. CBC:  Recent Labs Lab 09/10/14 0359 09/11/14 0352 09/12/14 0350 09/13/14 0415 09/14/14 0359  WBC 12.0* 12.7* 13.3* 13.9* 17.2*  HGB 9.2* 10.2* 10.3* 10.3* 11.4*  HCT 29.1* 32.2* 32.3* 32.9* 34.6*  MCV 88.4 86.6 86.1 86.8 85.0  PLT 315 322 305 313 286     BNP (last 3 results)  Recent Labs  08/15/14 0425  PROBNP 114.8   CBG:  Recent Labs Lab 09/13/14 0407 09/13/14 0727 09/13/14 1146 09/13/14 1557 09/14/14 0404  GLUCAP 159* 134* 144* 145* 137*   Sepsis Labs:  Recent Labs Lab 09/08/14 0650  09/11/14 0352 09/12/14 0350 09/13/14 0415 09/14/14 0359  PROCALCITON 0.30  --   --   --   --   --   WBC  --   < > 12.7* 13.3* 13.9* 17.2*  < > = values  in this interval not displayed.  Microbiology Recent Results (from the past 240 hour(s))  Blood culture (routine x 2)     Status: None   Collection Time: 09/06/14  3:32 PM  Result Value Ref Range Status   Specimen Description BLOOD LEFT FOREARM  Final   Special Requests BOTTLES DRAWN AEROBIC AND ANAEROBIC 5CC  Final   Culture  Setup Time   Final    09/06/2014 20:48 Performed at [place]   Final    NO GROWTH 5 DAYS Performed at [facility]    Report Status 09/12/2014 FINAL  Final  Blood culture (routine x 2)     Status: None   Collection Time: 09/06/14  3:33 PM  Result Value Ref Range Status   Specimen Description BLOOD LEFT ANTECUBITAL  Final   Special Requests BOTTLES DRAWN AEROBIC AND  ANAEROBIC 5CC  Final   Culture  Setup Time   Final    09/06/2014 20:48 Performed at [place]   Final    NO GROWTH 5 DAYS Performed at [facility]    Report Status 09/12/2014 FINAL  Final  MRSA PCR Screening     Status: None   Collection Time: 09/06/14  5:59 PM  Result Value Ref Range Status   MRSA by PCR NEGATIVE NEGATIVE Final    Comment:        The GeneXpert MRSA Assay (FDA approved for NASAL specimens only), is one component of a comprehensive MRSA colonization surveillance program. It is not intended to diagnose MRSA infection nor to guide or monitor treatment for MRSA infections.   Culture, sputum-assessment     Status: None   Collection Time: 09/10/14  2:24 PM  Result Value Ref Range Status   Specimen Description SPUTUM  Final   Special Requests NONE  Final   Sputum evaluation   Final    THIS SPECIMEN IS ACCEPTABLE. RESPIRATORY CULTURE REPORT TO FOLLOW.   Report Status 09/10/2014 FINAL  Final  Culture, respiratory (NON-Expectorated)     Status: None   Collection Time: 09/10/14  2:24 PM  Result Value Ref Range Status   Specimen Description SPUTUM  Final   Special Requests NONE  Final   Gram Stain   Final     FEW WBC PRESENT, PREDOMINANTLY PMN NO SQUAMOUS EPITHELIAL CELLS SEEN FEW YEAST RARE GRAM POSITIVE COCCI IN PAIRS    Culture   Final    MODERATE CANDIDA ALBICANS Performed at [facility]    Report Status 09/12/2014 FINAL  Final  Clostridium Difficile by PCR     Status: None   Collection Time: 09/11/14  9:00 PM  Result Value Ref Range Status   C difficile by pcr NEGATIVE NEGATIVE Final    Comment: Performed at [facility]     Medications:   . antiseptic oral rinse  7 mL Mouth Rinse q12n4p  . ARIPiprazole  2 mg Oral Daily  . ceFEPime (MAXIPIME) IV  1 g Intravenous 3 times per day  . chlorhexidine  15 mL Mouth Rinse BID  . enoxaparin (LOVENOX) injection  40 mg Subcutaneous Q24H  . feeding supplement (RESOURCE BREEZE)  1 Container Oral TID BM  . ferrous sulfate  325 mg Oral BID WC  . FLUoxetine  40 mg Oral BID  . folic acid  1 mg Oral Daily  . insulin aspart  0-15 Units Subcutaneous 6 times per day  . methylPREDNISolone (SOLU-MEDROL) injection  80 mg Intravenous Q12H  . OxyCODONE  30 mg Oral 3 times per day  . pantoprazole  40 mg Oral Daily  . polyethylene glycol  17 g Oral Daily  . senna-docusate  2 tablet Oral BID  . simvastatin  40 mg Oral QHS  . sodium chloride  10-40 mL Intracatheter Q12H  . tiotropium  18 mcg Inhalation Daily   Continuous Infusions: . sodium chloride 10 mL/hr at 09/14/14 0600    Time spent: 35 minutes.  The patient is medically complex and requires high complexity decision making.    LOS: 8 days   [facility] Pager [phone]. If unable to reach me by pager, please call my cell phone at [phone].  *Please refer to [email or web address], password [email or web address] to get updated schedule on who will round on this patient, as hospitalists switch teams weekly. If 7PM-7AM, please contact night-coverage at  [email or web address], password [email or web address] for any overnight needs.  09/14/2014, 7:38 AM

## 2014-09-15 DIAGNOSIS — R531 Weakness: Secondary | ICD-10-CM

## 2014-09-15 DIAGNOSIS — J9600 Acute respiratory failure, unspecified whether with hypoxia or hypercapnia: Secondary | ICD-10-CM | POA: Insufficient documentation

## 2014-09-15 DIAGNOSIS — R0603 Acute respiratory distress: Secondary | ICD-10-CM | POA: Insufficient documentation

## 2014-09-15 LAB — GLUCOSE, CAPILLARY
GLUCOSE-CAPILLARY: 173 mg/dL — AB (ref 70–99)
GLUCOSE-CAPILLARY: 115 mg/dL — AB (ref 70–99)
Glucose-Capillary: 155 mg/dL — ABNORMAL HIGH (ref 70–99)
Glucose-Capillary: 171 mg/dL — ABNORMAL HIGH (ref 70–99)
Glucose-Capillary: 147 mg/dL — ABNORMAL HIGH (ref 70–99)

## 2014-09-15 LAB — BASIC METABOLIC PANEL
Anion gap: 11 (ref 5–15)
BUN: 40 mg/dL — ABNORMAL HIGH (ref 6–23)
CHLORIDE: 96 meq/L (ref 96–112)
CO2: 26 mmol/L (ref 19–32)
Calcium: 9 mg/dL (ref 8.4–10.5)
Creatinine, Ser: 0.84 mg/dL (ref 0.50–1.10)
GFR, EST AFRICAN AMERICAN: 85 mL/min — AB (ref >90)
GFR, EST NON AFRICAN AMERICAN: 74 mL/min — AB (ref >90)
Glucose, Bld: 170 mg/dL — ABNORMAL HIGH (ref 70–99)
POTASSIUM: 4.4 mmol/L (ref 3.5–5.1)
SODIUM: 133 mmol/L — AB (ref 135–145)

## 2014-09-15 LAB — MAGNESIUM: Magnesium: 2.6 mg/dL — ABNORMAL HIGH (ref 1.5–2.5)

## 2014-09-15 LAB — CBC
HEMATOCRIT: 36.9 % (ref 36.0–46.0)
HEMOGLOBIN: 11.6 g/dL — AB (ref 12.0–15.0)
MCH: 27.2 pg (ref 26.0–34.0)
MCHC: 31.4 g/dL (ref 30.0–36.0)
MCV: 86.6 fL (ref 78.0–100.0)
Platelets: 281 10*3/uL (ref 150–400)
RBC: 4.26 MIL/uL (ref 3.87–5.11)
RDW: 14.3 % (ref 11.5–15.5)
WBC: 17.7 10*3/uL — ABNORMAL HIGH (ref 4.0–10.5)

## 2014-09-15 LAB — PHOSPHORUS: Phosphorus: 5.1 mg/dL — ABNORMAL HIGH (ref 2.3–4.6)

## 2014-09-15 MED ORDER — KCL IN DEXTROSE-NACL 40-5-0.45 MEQ/L-%-% IV SOLN
INTRAVENOUS | Status: DC
  Administered 2014-09-15: 1000 mL via INTRAVENOUS
  Administered 2014-09-17: 13:00:00 via INTRAVENOUS
  Administered 2014-09-17: 100 mL via INTRAVENOUS
  Filled 2014-09-15 (×7): qty 1000

## 2014-09-15 MED ORDER — METHYLPREDNISOLONE SODIUM SUCC 125 MG IJ SOLR
80.0000 mg | Freq: Every day | INTRAMUSCULAR | Status: DC
  Administered 2014-09-16: 80 mg via INTRAVENOUS
  Filled 2014-09-15: qty 2

## 2014-09-15 NOTE — Progress Notes (Addendum)
Progress Note   Emily West [facility] DQQ:[id number] DOB: June 18, 1951 DOA: 09/06/2014 PCP: [name], MD   Brief Narrative:   Emily West is an 64 y.o. female with h/o recent diagnosis of stage 4 NSCLC , admitted for acute hypoxic respiratory failure presumed to be infectious. She was started on high dose steroids and IV antibiotics and has been on NRB since admission. PCCM and oncology consulted. Patient's husband requested palliative care meeting for goals of care.   Assessment/Plan:   Principal problem:  Severe sepsis secondary to HCAP / Acute hypoxic respiratory failure -Admitted to step down, still with high oxygen requirement.  -Has pneumonitis from infectious process versus lymphangitic spread of cancer.  Treating her for both for health care associated pneumonia with IV antibiotics and IV steroids for inflammation. Spiriva added.  Continue diuresis.  I&O balance -255 cc. -Minimal pleural effusions and worsened opacities on CXR. Started her on IV lasix on 12/27. F/U CXR 09/13/14 unchanged. - Continue Incentive spirometry, Nebulizer treatments if needed. Keep sats greater than 90%.  -Influenza PCR was negative. BNP is 227. Repeat sputum culture pending. -PCCM following.  -Currently she is full code.   Active problems:  NSCLC with bone and brain mets: - s/p palliative radiation treatment. Chemo scheduled for 12/30. But on hold for now.  - Dr [name] on board.  Seen by palliative care team as well.  Still wishes Full Code status.  Hypokalemia  -Repleted as needed.  Anemia -Normocytic and at baseline.   Mild leukocytosis: -Possibly from the infection.   DVT prophlylaxis -Continue Lovenox.    Code Status: Full code. Family Communication:husband Daughter-in-law at bedside. Disposition Plan: Home when stable.    IV Access:    PICC placed 09/12/14   Procedures and diagnostic studies:   Dg Chest 1 View 09/06/2014: Significant increase in bilateral  infiltrates a portion which may be related to the patient's underlying neoplastic history.     Dg Hip Complete Right 09/06/2014: No acute abnormality noted.     Ct Angio Chest Pe W/cm &/or Wo Cm 09/06/2014: 1. Interval development of bilateral diffuse patchy airspace disease with ground-glass opacities and bilateral pleural effusions. Findings most concerning for an infectious or inflammatory process including hypersensitivity pneumonitis versus pulmonary edema. 2. Findings concerning for a severe pathologic T10 vertebral body compression fracture. 3. Mediastinal and right hilar lymphadenopathy persists.    Dg Chest Port 1 View 09/07/2014: Progression of diffuse bilateral airspace disease consistent with edema or infection.     Dg Chest Port 1 View 09/08/2014:  No significant interval change in bilateral airspace disease when compared with the prior exam.     Dg Chest Port 1 View 09/09/2014: Diffuse airspace disease stable.  Improved right pleural effusion.     Dg Chest Port 1 View 09/10/2014: Stable diffuse bilateral mixed interstitial airspace process with slight worsening more focal airspace density in the right base. Findings are likely due to infection. Possible small left effusion.     Dg Chest Port 1 View 09/11/2014: 1. Persistent severe bilateral interstitial infiltrates without significant change from prior exam.  2.  Bibasilar atelectasis.     Dg Chest Port 1 View 09/12/2014: 1. Persistent severe bilateral interstitial infiltrates. No significant change from prior exam. 2. Bibasilar atelectasis.     Medical Consultants:    Palliative Care  Oncology  PCCM  Anti-Infectives:    Vancomycin 09/06/14---> 09/12/14  Zosyn 09/06/14---> 09/06/14  Cefepime 09/06/14--->   Subjective:   Emily West [facility] feels  well.  Dyspnea has improved.  No N/V.  Pain controlled.  Appetite good---ate pizza last night.  Bowels moving.  Objective:    Filed Vitals:   09/15/14 0000 09/15/14  0200 09/15/14 0400 09/15/14 0600  BP: 94/44 106/53 114/47 133/64  Pulse: 68 65 60 65  Temp:   98.1 F (36.7 C)   TempSrc:      Resp: 22 18 18 20   Height:      Weight:      SpO2: 92% 95% 98% 95%    Intake/Output Summary (Last 24 hours) at 09/15/14 0722 Last data filed at 09/15/14 8588  Gross per 24 hour  Intake   1500 ml  Output   1755 ml  Net   -255 ml    Exam: Gen:  NAD Cardiovascular:  RRR, No M/R/G Respiratory:  Lungs diminished with decreased bibasilar crackles Gastrointestinal:  Abdomen soft, NT/ND, + BS Extremities:  No C/E/C   Data Reviewed:    Labs: Basic Metabolic Panel:  Recent Labs Lab 09/11/14 0352 09/12/14 0350 09/13/14 0415 09/14/14 0359 09/15/14 0400  NA 139 138 135 130* 133*  K 3.2* 3.6 4.3 3.9 4.4  CL 101 95* 88* 90* 96  CO2 31 35* 40* 32 26  GLUCOSE 145* 159* 158* 150* 170*  BUN 27* 27* 27* 29* 40*  CREATININE 0.56 0.56 0.63 0.76 0.84  CALCIUM 8.4 8.4 8.6 8.7 9.0  MG  --  2.0 2.1 2.2 2.6*  PHOS  --  3.1 4.0 4.1 5.1*   GFR Estimated Creatinine Clearance: 77.2 mL/min (by C-G formula based on Cr of 0.84).  CBC:  Recent Labs Lab 09/11/14 0352 09/12/14 0350 09/13/14 0415 09/14/14 0359 09/15/14 0400  WBC 12.7* 13.3* 13.9* 17.2* 17.7*  HGB 10.2* 10.3* 10.3* 11.4* 11.6*  HCT 32.2* 32.3* 32.9* 34.6* 36.9  MCV 86.6 86.1 86.8 85.0 86.6  PLT 322 305 313 286 281     BNP (last 3 results)  Recent Labs  08/15/14 0425  PROBNP 114.8   CBG:  Recent Labs Lab 09/14/14 1212 09/14/14 1623 09/14/14 1945 09/14/14 2323 09/15/14 0353  GLUCAP 117* 170* 149* 173* 155*   Sepsis Labs:  Recent Labs Lab 09/12/14 0350 09/13/14 0415 09/14/14 0359 09/15/14 0400  WBC 13.3* 13.9* 17.2* 17.7*    Microbiology Recent Results (from the past 240 hour(s))  Blood culture (routine x 2)     Status: None   Collection Time: 09/06/14  3:32 PM  Result Value Ref Range Status   Specimen Description BLOOD LEFT FOREARM  Final   Special Requests  BOTTLES DRAWN AEROBIC AND ANAEROBIC 5CC  Final   Culture  Setup Time   Final    09/06/2014 20:48 Performed at [facility]   Final    NO GROWTH 5 DAYS Performed at [facility]    Report Status 09/12/2014 FINAL  Final  Blood culture (routine x 2)     Status: None   Collection Time: 09/06/14  3:33 PM  Result Value Ref Range Status   Specimen Description BLOOD LEFT ANTECUBITAL  Final   Special Requests BOTTLES DRAWN AEROBIC AND ANAEROBIC 5CC  Final   Culture  Setup Time   Final    09/06/2014 20:48 Performed at [facility]   Final    NO GROWTH 5 DAYS Performed at [facility]    Report Status 09/12/2014 FINAL  Final  MRSA PCR Screening     Status: None  Collection Time: 09/06/14  5:59 PM  Result Value Ref Range Status   MRSA by PCR NEGATIVE NEGATIVE Final    Comment:        The GeneXpert MRSA Assay (FDA approved for NASAL specimens only), is one component of a comprehensive MRSA colonization surveillance program. It is not intended to diagnose MRSA infection nor to guide or monitor treatment for MRSA infections.   Culture, sputum-assessment     Status: None   Collection Time: 09/10/14  2:24 PM  Result Value Ref Range Status   Specimen Description SPUTUM  Final   Special Requests NONE  Final   Sputum evaluation   Final    THIS SPECIMEN IS ACCEPTABLE. RESPIRATORY CULTURE REPORT TO FOLLOW.   Report Status 09/10/2014 FINAL  Final  Culture, respiratory (NON-Expectorated)     Status: None   Collection Time: 09/10/14  2:24 PM  Result Value Ref Range Status   Specimen Description SPUTUM  Final   Special Requests NONE  Final   Gram Stain   Final    FEW WBC PRESENT, PREDOMINANTLY PMN NO SQUAMOUS EPITHELIAL CELLS SEEN FEW YEAST RARE GRAM POSITIVE COCCI IN PAIRS    Culture   Final    MODERATE CANDIDA ALBICANS Performed at [facility]    Report Status 09/12/2014 FINAL  Final  Clostridium Difficile by PCR      Status: None   Collection Time: 09/11/14  9:00 PM  Result Value Ref Range Status   C difficile by pcr NEGATIVE NEGATIVE Final    Comment: Performed at [facility]  Culture, expectorated sputum-assessment     Status: None   Collection Time: 09/14/14  3:10 PM  Result Value Ref Range Status   Specimen Description SPUTUM  Final   Special Requests NONE  Final   Sputum evaluation   Final    THIS SPECIMEN IS ACCEPTABLE. RESPIRATORY CULTURE REPORT TO FOLLOW.   Report Status 09/14/2014 FINAL  Final     Medications:   . antiseptic oral rinse  7 mL Mouth Rinse q12n4p  . ARIPiprazole  2 mg Oral Daily  . ceFEPime (MAXIPIME) IV  1 g Intravenous 3 times per day  . chlorhexidine  15 mL Mouth Rinse BID  . enoxaparin (LOVENOX) injection  40 mg Subcutaneous Q24H  . feeding supplement (RESOURCE BREEZE)  1 Container Oral TID BM  . ferrous sulfate  325 mg Oral BID WC  . fluconazole (DIFLUCAN) IV  200 mg Intravenous Q24H  . FLUoxetine  40 mg Oral BID  . folic acid  1 mg Oral Daily  . insulin aspart  0-15 Units Subcutaneous 6 times per day  . methylPREDNISolone (SOLU-MEDROL) injection  80 mg Intravenous Q12H  . OxyCODONE  30 mg Oral 3 times per day  . pantoprazole  40 mg Oral Daily  . polyethylene glycol  17 g Oral Daily  . rosuvastatin  10 mg Oral QHS  . senna-docusate  2 tablet Oral BID  . sodium chloride  10-40 mL Intracatheter Q12H  . tiotropium  18 mcg Inhalation Daily   Continuous Infusions: . sodium chloride 10 mL/hr at 09/15/14 0600    Time spent: 25 minutes.    LOS: 9 days   [place] [facility] Pager [phone]. If unable to reach me by pager, please call my cell phone at [phone].  *Please refer to [email or web address], password [email or web address] to get updated schedule on who will round on this patient, as hospitalists switch teams weekly. If 7PM-7AM, please contact night-coverage at  [email or web address], password [email or web address] for any overnight needs.  09/15/2014, 7:22 AM

## 2014-09-15 NOTE — Progress Notes (Addendum)
Name: Emily West [facility] MRN: [id number] DOB: March 06, 1951    ADMISSION DATE:  09/06/2014 CONSULTATION DATE:  09/06/2014  REFERRING MD :  Karleen [place]  CHIEF COMPLAINT:  SOB  BRIEF PATIENT DESCRIPTION: 64 year old female with recent dx stage IV NSCLC admitted 12/23 for hypoxic respiratory failure 2/2 presumed CAP. Despite high flow O2 she remained hypoxic. PCCM asked to consult. - was to start chemo on 12/30 PRIOR THERAPY:  1) Stereotactic radiotherapy to the metastatic bone lesions at T10 and L3 under Dr. [name] and Dr. Vertell Limber. 2) stereotactic radiotherapy to a solitary brain lesion under the care of Dr. [name].  SIGNIFICANT EVENTS  12/25 bipap overnight  STUDIES:  Echo 12/1 > LVEF 55-60% CT chest 12/23 > bilateral GGO with bilateral pleural effusions. Pulm edema vs hypersensitivity pneumonitis. Pathologic T10 vertebral body fx.    SUBJECTIVE: No major changes, remains off BIPAP  VITAL SIGNS: Temp:  [97.4 F (36.3 C)-98.2 F (36.8 C)] 97.7 F (36.5 C) (01/01 0800) Pulse Rate:  [59-84] 84 (01/01 1100) Resp:  [13-23] 18 (01/01 1100) BP: (94-135)/(40-64) 135/58 mmHg (01/01 1000) SpO2:  [92 %-100 %] 99 % (01/01 1100)  PHYSICAL EXAMINATION:  Gen: no acute distress, on oximizer HEENT: NCAT EOMi PULM: crackles bilaterally, good air movement, no wheezing CV: RRR, no mgr AB: BS+, soft, nontender Ext: warm, no edema Neuro: A&Ox4, maew    Recent Labs Lab 09/13/14 0415 09/14/14 0359 09/15/14 0400  NA 135 130* 133*  K 4.3 3.9 4.4  CL 88* 90* 96  CO2 40* 32 26  BUN 27* 29* 40*  CREATININE 0.63 0.76 0.84  GLUCOSE 158* 150* 170*    Recent Labs Lab 09/13/14 0415 09/14/14 0359 09/15/14 0400  HGB 10.3* 11.4* 11.6*  HCT 32.9* 34.6* 36.9  WBC 13.9* 17.2* 17.7*  PLT 313 286 281   Dg Chest Port 1 View  09/14/2014   CLINICAL DATA:  Respiratory failure.  EXAM: PORTABLE CHEST - 1 VIEW  COMPARISON:  09/13/2014.  FINDINGS: Right PICC line in stable position. Mediastinal  structures normal. Heart size stable. Diffuse bilateral pulmonary interstitial infiltrates are present and unchanged. No pleural effusion or pneumothorax  IMPRESSION: 1.  PICC line is in position.  2.  Diffuse unchanged bilateral pulmonary interstitial infiltrates.   Electronically Signed   By: Marcello Moores  Register   On: 09/14/2014 07:18   ASSESSMENT / PLAN:  PULMONARY A: Acute hypoxemic respiratory failure due to HCAP and most likely lymphangitic spread of Stage IV Non Small Cell Cancer  I don't think she has pulmonary edema at this point (1/1) as she is net negative 8 liters this hospital stay;  Poor prognosis, don't think that she will improve significantly in terms of oxygenation P:   - Titrate FiO2 to maintatin SpO2 > 90%. - Wean Solumedrol 80mg  IV and then taper off over 7 days with prednisone - Incentive spirometry. - Out of bed - Ambulate as able - Oxymizer, hopefully can set up for home - PRN albuterol. - Continue spiriva.  CARDIOVASCULAR A:  Hypotension resolved H/o HTN Chronic diastolic heart failure, doubt exacerbation, low BNP 8 liters negative for hospital stay P:  - Telemetry. - BP meds per primary service - Continue statin. - Goal net even to slightly negative  ID: A: HCAP resolved, don't think she has candida pneumonia as that condition is exceedingly rare and she doesn't cough up mucus or have fever P: -stop diflucan  FAMILY  - Updates: Patient and husband updated at length bedside and by  phone respectively.  They are both interested in hospice, though I gather it has taken them a long time to come to this.  They both agree to DNR status, so I wrote that order.   Roselie Awkward, MD [place] [facility] Pager: [phone] Cell: [phone] If no response, call [phone]

## 2014-09-15 NOTE — Progress Notes (Signed)
Physical Therapy Treatment Patient Details Name: Emily West [facility] MRN: [id number] DOB: 28-Jul-1951 Today's Date: 09/15/2014    History of Present Illness 64 yo female admitted with acute hypoxic resp failure, sepsis. 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     PT Comments    Progressing with mobility. Pt desats, with activity, into low 80s% on 8L oximizer. Pt very motivated to mobilize and she tolerated it fairly well this session.   Follow Up Recommendations  Home health PT;Supervision/Assistance - 24 hour     Equipment Recommendations  Rolling walker with 5" wheels;Hospital bed;3in1 (PT);Wheelchair (measurements PT);Wheelchair cushion (measurements PT)    Recommendations for Other Services OT consult     Precautions / Restrictions Precautions Precautions: Back;Fall Precaution Comments: back precautions for safety; logroll; monitor O2 sats Required Braces or Orthoses: Spinal Brace Spinal Brace: Thoracolumbosacral orthotic;Applied in sitting position Restrictions Weight Bearing Restrictions: No    Mobility  Bed Mobility Overal bed mobility: Needs Assistance Bed Mobility: Rolling;Sidelying to Sit Rolling: Min guard Sidelying to sit: Min assist       General bed mobility comments: Assist for trunk and LEs. Increased time. VCS safety, technique. O2 sats dropped to 80s%.   Transfers Overall transfer level: Needs assistance Equipment used: Rolling walker (2 wheeled) Transfers: Sit to/from [facility] Sit to Stand: Min assist Stand pivot transfers: Min assist       General transfer comment: assist to rise, stabilize, control descent, maneuer with walker. O2 sats dropped to 80s.%  Ambulation/Gait                 Stairs            Wheelchair Mobility    Modified Rankin (Stroke Patients Only)       Balance                                    Cognition Arousal/Alertness:  Awake/alert Behavior During Therapy: WFL for tasks assessed/performed Overall Cognitive Status: Within Functional Limits for tasks assessed                      Exercises      General Comments        Pertinent Vitals/Pain Pain Assessment: No/denies pain    Home Living                      Prior Function            PT Goals (current goals can now be found in the care plan section) Progress towards PT goals: Progressing toward goals    Frequency  Min 3X/week    PT Plan Current plan remains appropriate    Co-evaluation             End of Session Equipment Utilized During Treatment: Oxygen Activity Tolerance: Patient tolerated treatment well Patient left: in chair;with call bell/phone within reach     Time: 6546-5035 PT Time Calculation (min) (ACUTE ONLY): 21 min  Charges:  $Therapeutic Activity: 8-22 mins                    G Codes:      Weston Anna, MPT Pager: [phone]

## 2014-09-15 NOTE — Progress Notes (Signed)
Progress Note from the Palliative Medicine Team at [facility]  Subjective:  -meet at bedside with patient and friend Emily West  -continued conversation with patient regarding [place] and disposition options, encouraged her again to make decisions about going home understanding that this is most likely her baseline (spoke with husband by telephone and discussed the same)    Objective: No Known Allergies Scheduled Meds: . antiseptic oral rinse  7 mL Mouth Rinse q12n4p  . ARIPiprazole  2 mg Oral Daily  . ceFEPime (MAXIPIME) IV  1 g Intravenous 3 times per day  . chlorhexidine  15 mL Mouth Rinse BID  . enoxaparin (LOVENOX) injection  40 mg Subcutaneous Q24H  . feeding supplement (RESOURCE BREEZE)  1 Container Oral TID BM  . ferrous sulfate  325 mg Oral BID WC  . fluconazole (DIFLUCAN) IV  200 mg Intravenous Q24H  . FLUoxetine  40 mg Oral BID  . folic acid  1 mg Oral Daily  . insulin aspart  0-15 Units Subcutaneous 6 times per day  . methylPREDNISolone (SOLU-MEDROL) injection  80 mg Intravenous Q12H  . OxyCODONE  30 mg Oral 3 times per day  . pantoprazole  40 mg Oral Daily  . polyethylene glycol  17 g Oral Daily  . rosuvastatin  10 mg Oral QHS  . senna-docusate  2 tablet Oral BID  . sodium chloride  10-40 mL Intracatheter Q12H  . tiotropium  18 mcg Inhalation Daily   Continuous Infusions: . sodium chloride 10 mL/hr at 09/15/14 0600   PRN Meds:.albuterol, ALPRAZolam, ondansetron (ZOFRAN) IV, oxyCODONE, sodium chloride, sodium chloride HYPERTONIC  BP 133/64 mmHg  Pulse 65  Temp(Src) 97.7 F (36.5 C) (Oral)  Resp 20  Ht 5\' 5"  (1.651 m)  Wt 88.3 kg (194 lb 10.7 oz)  BMI 32.39 kg/m2  SpO2 95%   PPS:30 % at best  Pain Score:denies    Intake/Output Summary (Last 24 hours) at 09/15/14 1124 Last data filed at 09/15/14 2595  Gross per 24 hour  Intake   1360 ml  Output   1755 ml  Net   -395 ml      Physical Exam:  General: Chronically ill appearing, dyspneic on minimal  exertion HEENT:  moist buccal membranes Chest:   Diminished throughout, O2 sats drop rapidly without O2 support CVS: RRR Abdomen: soft NT +BS Neuro: alert and oriented X3   Labs: CBC    Component Value Date/Time   WBC 17.7* 09/15/2014 0400   WBC 8.6 09/01/2014 0835   RBC 4.26 09/15/2014 0400   RBC 3.82 09/01/2014 0835   RBC 3.64* 08/15/2014 0425   HGB 11.6* 09/15/2014 0400   HGB 10.6* 09/01/2014 0835   HCT 36.9 09/15/2014 0400   HCT 34.0* 09/01/2014 0835   PLT 281 09/15/2014 0400   PLT 299 09/01/2014 0835   MCV 86.6 09/15/2014 0400   MCV 89.0 09/01/2014 0835   MCH 27.2 09/15/2014 0400   MCH 27.7 09/01/2014 0835   MCHC 31.4 09/15/2014 0400   MCHC 31.2* 09/01/2014 0835   RDW 14.3 09/15/2014 0400   RDW 14.2 09/01/2014 0835   LYMPHSABS 1.1 09/06/2014 1208   LYMPHSABS 1.2 09/01/2014 0835   MONOABS 1.0 09/06/2014 1208   MONOABS 0.6 09/01/2014 0835   EOSABS 0.1 09/06/2014 1208   EOSABS 0.0 09/01/2014 0835   BASOSABS 0.0 09/06/2014 1208   BASOSABS 0.0 09/01/2014 0835    BMET    Component Value Date/Time   NA 133* 09/15/2014 0400   NA 138 09/01/2014 0835  K 4.4 09/15/2014 0400   K 4.8 09/01/2014 0835   CL 96 09/15/2014 0400   CO2 26 09/15/2014 0400   CO2 25 09/01/2014 0835   GLUCOSE 170* 09/15/2014 0400   GLUCOSE 120 09/01/2014 0835   BUN 40* 09/15/2014 0400   BUN 13.5 09/01/2014 0835   CREATININE 0.84 09/15/2014 0400   CREATININE 0.8 09/01/2014 0835   CALCIUM 9.0 09/15/2014 0400   CALCIUM 8.9 09/01/2014 0835   GFRNONAA 74* 09/15/2014 0400   GFRAA 85* 09/15/2014 0400    CMP     Component Value Date/Time   NA 133* 09/15/2014 0400   NA 138 09/01/2014 0835   K 4.4 09/15/2014 0400   K 4.8 09/01/2014 0835   CL 96 09/15/2014 0400   CO2 26 09/15/2014 0400   CO2 25 09/01/2014 0835   GLUCOSE 170* 09/15/2014 0400   GLUCOSE 120 09/01/2014 0835   BUN 40* 09/15/2014 0400   BUN 13.5 09/01/2014 0835   CREATININE 0.84 09/15/2014 0400   CREATININE 0.8 09/01/2014  0835   CALCIUM 9.0 09/15/2014 0400   CALCIUM 8.9 09/01/2014 0835   PROT 6.6 09/06/2014 1208   PROT 6.5 09/01/2014 0835   ALBUMIN 2.5* 09/06/2014 1208   ALBUMIN 2.5* 09/01/2014 0835   AST 21 09/06/2014 1208   AST 17 09/01/2014 0835   ALT 12 09/06/2014 1208   ALT 20 09/01/2014 0835   ALKPHOS 137* 09/06/2014 1208   ALKPHOS 172* 09/01/2014 0835   BILITOT 0.9 09/06/2014 1208   BILITOT 0.60 09/01/2014 0835   GFRNONAA 74* 09/15/2014 0400   GFRAA 85* 09/15/2014 0400     Assessment and Plan: 1. Code Status:Full code          Encouraged to consider DNR/DNI 2. Symptom Control: Dyspnea  - O2 Oximyzer for comfort and anticipation of dc home   3. Psycho/Social:  Emotional support offered to patient and family.  I worry that patient and husband have poor prognostic awareness and understanding of home needs and anticipated trajectory of the situation.  4. Spiritual  Chaplain involved  5. Disposition: Pending outcomes, patient would like to return home when medically stable and care needs in place  Patient Documents Completed or Given: Document Given Completed  Advanced Directives Pkt    35MOST X   DNR    Gone from My Sight    Hard Choices      Time In Time Out Total Time Spent with Patient Total Overall Time  0800 0835 35 min 35  min    Greater than 50%  of this time was spent counseling and coordinating care related to the above assessment and plan.  [name] NP  Palliative Medicine Team Team Phone # [phone] Pager [phone]   1

## 2014-09-16 DIAGNOSIS — R531 Weakness: Secondary | ICD-10-CM

## 2014-09-16 LAB — GLUCOSE, CAPILLARY
GLUCOSE-CAPILLARY: 138 mg/dL — AB (ref 70–99)
Glucose-Capillary: 126 mg/dL — ABNORMAL HIGH (ref 70–99)
Glucose-Capillary: 130 mg/dL — ABNORMAL HIGH (ref 70–99)
Glucose-Capillary: 144 mg/dL — ABNORMAL HIGH (ref 70–99)
Glucose-Capillary: 145 mg/dL — ABNORMAL HIGH (ref 70–99)
Glucose-Capillary: 152 mg/dL — ABNORMAL HIGH (ref 70–99)
Glucose-Capillary: 205 mg/dL — ABNORMAL HIGH (ref 70–99)

## 2014-09-16 MED ORDER — PREDNISONE 20 MG PO TABS
40.0000 mg | ORAL_TABLET | Freq: Every day | ORAL | Status: DC
  Administered 2014-09-17 – 2014-09-21 (×5): 40 mg via ORAL
  Filled 2014-09-16 (×7): qty 2

## 2014-09-16 NOTE — Progress Notes (Signed)
   Name: Emily West MRN: [id number] DOB: Jan 09, 1951    ADMISSION DATE:  09/06/2014 CONSULTATION DATE:  09/06/2014  REFERRING MD :  Karleen Hampshire  CHIEF COMPLAINT:  SOB  BRIEF PATIENT DESCRIPTION: 64 year old female with recent dx stage IV NSCLC admitted 12/23 for hypoxic respiratory failure 2/2 presumed CAP. Despite high flow O2 she remained hypoxic. PCCM asked to consult. - was to start chemo on 12/30 PRIOR THERAPY:  1) Stereotactic radiotherapy to the metastatic bone lesions at T10 and L3 under Dr. [name] and Dr. Vertell Limber. 2) stereotactic radiotherapy to a solitary brain lesion under the care of Dr. [name].  SIGNIFICANT EVENTS  12/25 bipap overnight 1/2 made DNR  STUDIES:  Echo 12/1 > LVEF 55-60% CT chest 12/23 > bilateral GGO with bilateral pleural effusions. Pulm edema vs hypersensitivity pneumonitis. Pathologic T10 vertebral body fx.    SUBJECTIVE: Feels great, was up in chair  VITAL SIGNS: Temp:  [97.4 F (36.3 C)-97.6 F (36.4 C)] 97.6 F (36.4 C) (01/02 0800) Pulse Rate:  [60-84] 76 (01/02 0800) Resp:  [10-28] 17 (01/02 0800) BP: (111-132)/(43-71) 131/51 mmHg (01/02 0800) SpO2:  [92 %-100 %] 98 % (01/02 0800)  PHYSICAL EXAMINATION:  Gen: no acute distress, on oximizer HEENT: NCAT EOMi PULM: crackles bilaterally, good air movement, no wheezing CV: RRR, no mgr AB: BS+, soft, nontender Ext: warm, no edema Neuro: A&Ox4, maew    Recent Labs Lab 09/13/14 0415 09/14/14 0359 09/15/14 0400  NA 135 130* 133*  K 4.3 3.9 4.4  CL 88* 90* 96  CO2 40* 32 26  BUN 27* 29* 40*  CREATININE 0.63 0.76 0.84  GLUCOSE 158* 150* 170*    Recent Labs Lab 09/13/14 0415 09/14/14 0359 09/15/14 0400  HGB 10.3* 11.4* 11.6*  HCT 32.9* 34.6* 36.9  WBC 13.9* 17.2* 17.7*  PLT 313 286 281   No results found. ASSESSMENT / PLAN:  PULMONARY A: Acute hypoxemic respiratory failure due to HCAP and most likely lymphangitic spread of Stage IV Non Small Cell Cancer  I don't  think she has pulmonary edema at this point (1/1) as she is net negative 8 liters this hospital stay;  Poor prognosis, don't think that she will improve significantly in terms of oxygenation P:   - Titrate FiO2 to maintatin SpO2 > 90%. - Last dose of solumedrol today, then change to prednisone tomorrow and wean off over 7 days - Incentive spirometry. - Out of bed - Ambulate as able - Oxymizer, hopefully can set up for home - PRN albuterol. - Continue spiriva.  CARDIOVASCULAR A:  Hypotension resolved H/o HTN Chronic diastolic heart failure, doubt exacerbation, low BNP 8 liters negative for hospital stay P:  - Telemetry. - BP meds per primary service - Continue statin. - Goal net even to slightly negative  ID: A: HCAP resolved P: -monitor off antibiotics  FAMILY  - Updates: Patient and husband updated at length bedside 1/2 bedside by me, see discussion from 1/1 note.  Will not see on 1/3 unless needed, plan to come back on 1/4   Roselie Awkward, MD [place] [facility] Pager: [phone] Cell: [phone] If no response, call [phone]

## 2014-09-16 NOTE — Progress Notes (Signed)
Progress Note   [facility] RXY:[id number] DOB: 1951/03/13 DOA: 09/06/2014 PCP: [name], MD   Brief Narrative:   Emily West is an 64 y.o. female with h/o recent diagnosis of stage 4 NSCLC , admitted for acute hypoxic respiratory failure presumed to be infectious. She was started on high dose steroids and IV antibiotics and has been on NRB since admission. PCCM and oncology consulted. Patient's husband requested palliative care meeting for goals of care.   Assessment/Plan:   Principal problem:  Severe sepsis secondary to HCAP / Acute hypoxic respiratory failure -Admitted to step down, still with high oxygen requirement.  -Has pneumonitis from infectious process versus lymphangitic spread of cancer.  Treating her for both for health care associated pneumonia with IV antibiotics and IV steroids for inflammation (will finish last dose of IV steroids today, then be switched to a prednisone taper over 7 days).  Continue Spiriva.  Diuresed. -Minimal pleural effusions and worsened opacities on CXR. Started her on IV lasix on 12/27. F/U CXR 09/13/14 unchanged. - Continue Incentive spirometry, Nebulizer treatments if needed. Keep sats greater than 90%.  -Influenza PCR was negative. BNP is 227. Repeat sputum culture pending. -PCCM following.   Active problems:  NSCLC with bone and brain mets: - s/p palliative radiation treatment. Chemo scheduled for 12/30. But on hold for now.  - Dr [name] on board.  Seen by palliative care team as well.  Now a DNR.  Hypokalemia  -Repleted as needed.  Anemia -Normocytic and at baseline.   Mild leukocytosis: -Possibly from the infection.   DVT prophlylaxis -Continue Lovenox.    Code Status: DNR Family Communication:husband Daughter-in-law at bedside. Disposition Plan: Home when stable.    IV Access:    PICC placed 09/12/14   Procedures and diagnostic studies:   Dg Chest 1 View 09/06/2014: Significant increase  in bilateral infiltrates a portion which may be related to the patient's underlying neoplastic history.     Dg Hip Complete Right 09/06/2014: No acute abnormality noted.     Ct Angio Chest Pe W/cm &/or Wo Cm 09/06/2014: 1. Interval development of bilateral diffuse patchy airspace disease with ground-glass opacities and bilateral pleural effusions. Findings most concerning for an infectious or inflammatory process including hypersensitivity pneumonitis versus pulmonary edema. 2. Findings concerning for a severe pathologic T10 vertebral body compression fracture. 3. Mediastinal and right hilar lymphadenopathy persists.    Dg Chest Port 1 View 09/07/2014: Progression of diffuse bilateral airspace disease consistent with edema or infection.     Dg Chest Port 1 View 09/08/2014:  No significant interval change in bilateral airspace disease when compared with the prior exam.     Dg Chest Port 1 View 09/09/2014: Diffuse airspace disease stable.  Improved right pleural effusion.     Dg Chest Port 1 View 09/10/2014: Stable diffuse bilateral mixed interstitial airspace process with slight worsening more focal airspace density in the right base. Findings are likely due to infection. Possible small left effusion.     Dg Chest Port 1 View 09/11/2014: 1. Persistent severe bilateral interstitial infiltrates without significant change from prior exam.  2.  Bibasilar atelectasis.     Dg Chest Port 1 View 09/12/2014: 1. Persistent severe bilateral interstitial infiltrates. No significant change from prior exam. 2. Bibasilar atelectasis.     Medical Consultants:    Palliative Care  Oncology  PCCM  Anti-Infectives:    Vancomycin 09/06/14---> 09/12/14  Zosyn 09/06/14---> 09/06/14  Cefepime 09/06/14--->   Subjective:  Emily West [facility] feels well.  Dyspnea has improved.  No N/V.  Pain controlled.  Bowels moving.   Objective:    Filed Vitals:   09/16/14 0000 09/16/14 0200 09/16/14 0400  09/16/14 0600  BP: 126/56 121/62 120/58 111/48  Pulse: 60 62 63 65  Temp: 97.4 F (36.3 C)  97.4 F (36.3 C)   TempSrc: Oral  Oral   Resp: 10 10 14 13   Height:      Weight:      SpO2: 100% 94% 96% 97%    Intake/Output Summary (Last 24 hours) at 09/16/14 0732 Last data filed at 09/16/14 0600  Gross per 24 hour  Intake   1850 ml  Output    825 ml  Net   1025 ml    Exam: Gen:  NAD Cardiovascular:  RRR, No M/R/G Respiratory:  Lungs diminished with decreased bibasilar crackles Gastrointestinal:  Abdomen soft, NT/ND, + BS Extremities:  No C/E/C   Data Reviewed:    Labs: Basic Metabolic Panel:  Recent Labs Lab 09/11/14 0352 09/12/14 0350 09/13/14 0415 09/14/14 0359 09/15/14 0400  NA 139 138 135 130* 133*  K 3.2* 3.6 4.3 3.9 4.4  CL 101 95* 88* 90* 96  CO2 31 35* 40* 32 26  GLUCOSE 145* 159* 158* 150* 170*  BUN 27* 27* 27* 29* 40*  CREATININE 0.56 0.56 0.63 0.76 0.84  CALCIUM 8.4 8.4 8.6 8.7 9.0  MG  --  2.0 2.1 2.2 2.6*  PHOS  --  3.1 4.0 4.1 5.1*   GFR Estimated Creatinine Clearance: 77.2 mL/min (by C-G formula based on Cr of 0.84).  CBC:  Recent Labs Lab 09/11/14 0352 09/12/14 0350 09/13/14 0415 09/14/14 0359 09/15/14 0400  WBC 12.7* 13.3* 13.9* 17.2* 17.7*  HGB 10.2* 10.3* 10.3* 11.4* 11.6*  HCT 32.2* 32.3* 32.9* 34.6* 36.9  MCV 86.6 86.1 86.8 85.0 86.6  PLT 322 305 313 286 281     BNP (last 3 results)  Recent Labs  08/15/14 0425  PROBNP 114.8   CBG:  Recent Labs Lab 09/15/14 1140 09/15/14 1638 09/15/14 1953 09/16/14 0022 09/16/14 0421  GLUCAP 115* 147* 205* 126* 144*   Sepsis Labs:  Recent Labs Lab 09/12/14 0350 09/13/14 0415 09/14/14 0359 09/15/14 0400  WBC 13.3* 13.9* 17.2* 17.7*    Microbiology Recent Results (from the past 240 hour(s))  Blood culture (routine x 2)     Status: None   Collection Time: 09/06/14  3:32 PM  Result Value Ref Range Status   Specimen Description BLOOD LEFT FOREARM  Final   Special  Requests BOTTLES DRAWN AEROBIC AND ANAEROBIC 5CC  Final   Culture  Setup Time   Final    09/06/2014 20:48 Performed at [place]   Final    NO GROWTH 5 DAYS Performed at [facility]    Report Status 09/12/2014 FINAL  Final  Blood culture (routine x 2)     Status: None   Collection Time: 09/06/14  3:33 PM  Result Value Ref Range Status   Specimen Description BLOOD LEFT ANTECUBITAL  Final   Special Requests BOTTLES DRAWN AEROBIC AND ANAEROBIC 5CC  Final   Culture  Setup Time   Final    09/06/2014 20:48 Performed at [place]   Final    NO GROWTH 5 DAYS Performed at [facility]    Report Status 09/12/2014 FINAL  Final  MRSA PCR Screening  Status: None   Collection Time: 09/06/14  5:59 PM  Result Value Ref Range Status   MRSA by PCR NEGATIVE NEGATIVE Final    Comment:        The GeneXpert MRSA Assay (FDA approved for NASAL specimens only), is one component of a comprehensive MRSA colonization surveillance program. It is not intended to diagnose MRSA infection nor to guide or monitor treatment for MRSA infections.   Culture, sputum-assessment     Status: None   Collection Time: 09/10/14  2:24 PM  Result Value Ref Range Status   Specimen Description SPUTUM  Final   Special Requests NONE  Final   Sputum evaluation   Final    THIS SPECIMEN IS ACCEPTABLE. RESPIRATORY CULTURE REPORT TO FOLLOW.   Report Status 09/10/2014 FINAL  Final  Culture, respiratory (NON-Expectorated)     Status: None   Collection Time: 09/10/14  2:24 PM  Result Value Ref Range Status   Specimen Description SPUTUM  Final   Special Requests NONE  Final   Gram Stain   Final    FEW WBC PRESENT, PREDOMINANTLY PMN NO SQUAMOUS EPITHELIAL CELLS SEEN FEW YEAST RARE GRAM POSITIVE COCCI IN PAIRS    Culture   Final    MODERATE CANDIDA ALBICANS Performed at [facility]    Report Status 09/12/2014 FINAL  Final  Clostridium  Difficile by PCR     Status: None   Collection Time: 09/11/14  9:00 PM  Result Value Ref Range Status   C difficile by pcr NEGATIVE NEGATIVE Final    Comment: Performed at [facility]  Culture, expectorated sputum-assessment     Status: None   Collection Time: 09/14/14  3:10 PM  Result Value Ref Range Status   Specimen Description SPUTUM  Final   Special Requests NONE  Final   Sputum evaluation   Final    THIS SPECIMEN IS ACCEPTABLE. RESPIRATORY CULTURE REPORT TO FOLLOW.   Report Status 09/14/2014 FINAL  Final     Medications:   . antiseptic oral rinse  7 mL Mouth Rinse q12n4p  . ARIPiprazole  2 mg Oral Daily  . ceFEPime (MAXIPIME) IV  1 g Intravenous 3 times per day  . chlorhexidine  15 mL Mouth Rinse BID  . enoxaparin (LOVENOX) injection  40 mg Subcutaneous Q24H  . feeding supplement (RESOURCE BREEZE)  1 Container Oral TID BM  . ferrous sulfate  325 mg Oral BID WC  . FLUoxetine  40 mg Oral BID  . folic acid  1 mg Oral Daily  . insulin aspart  0-15 Units Subcutaneous 6 times per day  . methylPREDNISolone (SOLU-MEDROL) injection  80 mg Intravenous Daily  . OxyCODONE  30 mg Oral 3 times per day  . pantoprazole  40 mg Oral Daily  . polyethylene glycol  17 g Oral Daily  . rosuvastatin  10 mg Oral QHS  . senna-docusate  2 tablet Oral BID  . sodium chloride  10-40 mL Intracatheter Q12H  . tiotropium  18 mcg Inhalation Daily   Continuous Infusions: . sodium chloride 10 mL/hr at 09/15/14 0600  . dextrose 5 % and 0.45 % NaCl with KCl 40 mEq/L 1,000 mL (09/15/14 1640)    Time spent: 25 minutes.    LOS: 10 days   [place] [facility] Pager [phone]. If unable to reach me by pager, please call my cell phone at [phone].  *Please refer to [email or web address], password [email or web address] to get updated schedule on who will round on this patient,  as hospitalists switch teams weekly. If 7PM-7AM, please contact night-coverage at [email or web address], password [email or web address] for any overnight  needs.  09/16/2014, 7:32 AM

## 2014-09-17 LAB — GLUCOSE, CAPILLARY
GLUCOSE-CAPILLARY: 150 mg/dL — AB (ref 70–99)
GLUCOSE-CAPILLARY: 158 mg/dL — AB (ref 70–99)
Glucose-Capillary: 132 mg/dL — ABNORMAL HIGH (ref 70–99)
Glucose-Capillary: 120 mg/dL — ABNORMAL HIGH (ref 70–99)
Glucose-Capillary: 111 mg/dL — ABNORMAL HIGH (ref 70–99)

## 2014-09-17 LAB — CULTURE, RESPIRATORY

## 2014-09-17 LAB — CULTURE, RESPIRATORY W GRAM STAIN

## 2014-09-17 MED ORDER — INSULIN ASPART 100 UNIT/ML ~~LOC~~ SOLN: 0.0000 [IU] | Freq: Every day | SUBCUTANEOUS | Status: DC

## 2014-09-17 MED ORDER — INSULIN ASPART 100 UNIT/ML ~~LOC~~ SOLN
0.0000 [IU] | Freq: Three times a day (TID) | SUBCUTANEOUS | Status: DC
  Administered 2014-09-17 – 2014-09-19 (×3): 3 [IU] via SUBCUTANEOUS
  Administered 2014-09-20: 2 [IU] via SUBCUTANEOUS
  Administered 2014-09-20: 3 [IU] via SUBCUTANEOUS
  Administered 2014-09-21: 2 [IU] via SUBCUTANEOUS

## 2014-09-17 NOTE — Progress Notes (Signed)
Patient transferred from ICU at around 1730. Alert and oriented x4,denis pain. On oximizer at 8L. Denies pain. Kept comfortable in bed.Will continue to monitor the patient.- Sandie Ano RN

## 2014-09-17 NOTE — Progress Notes (Signed)
Progress Note   Emily West [facility] OVZ:[id number] DOB: 1950/11/05 DOA: 09/06/2014 PCP: [name], MD   Brief Narrative:   Emily West is an 64 y.o. female with h/o recent diagnosis of stage 4 NSCLC , admitted for acute hypoxic respiratory failure presumed to be infectious. She was started on high dose steroids and IV antibiotics and has been on NRB since admission. PCCM and oncology consulted. Patient's husband requested palliative care meeting for goals of care.   Assessment/Plan:   Principal problem:  Severe sepsis secondary to HCAP / Acute hypoxic respiratory failure -Admitted to step down, still with high oxygen requirement, on oximyzer.  -Has pneumonitis from infectious process versus lymphangitic spread of cancer.  S/P 10 days of treatment with Cefepime and IV steroids for inflammation (now on prednisone--- taper over 7 days.  Continue Spiriva.  Diuresed. -Minimal pleural effusions and worsened opacities on CXR. Started her on IV lasix on 12/27. F/U CXR 09/13/14 unchanged. -Continue Incentive spirometry,nebulizer treatments if needed. Keep sats greater than 90%.  -Influenza PCR was negative. BNP is 227. Repeat sputum culture pending. -PCCM following. -Transfer to floor.  -Anticipate D/C home soon.  Active problems:  Steroid induced hyperglycemia -Still on moderate scale SSI Q 4 hours. -Change to moderate scale SSI Q AC/HS. -CBGs 120-150.    NSCLC with bone and brain mets: - s/p palliative radiation treatment. Chemo scheduled for 12/30. But on hold for now.  - Dr [name] on board.  Seen by palliative care team as well.  Now a DNR.  Hypokalemia  -Repleted as needed.  Anemia -Normocytic and at baseline.   Mild leukocytosis: -Possibly from the infection.   DVT prophlylaxis -Continue Lovenox.    Code Status: DNR Family Communication: Son at bedside. Disposition Plan: Home when stable.    IV Access:    PICC placed 09/12/14   Procedures  and diagnostic studies:   Dg Chest 1 View 09/06/2014: Significant increase in bilateral infiltrates a portion which may be related to the patient's underlying neoplastic history.     Dg Hip Complete Right 09/06/2014: No acute abnormality noted.     Ct Angio Chest Pe W/cm &/or Wo Cm 09/06/2014: 1. Interval development of bilateral diffuse patchy airspace disease with ground-glass opacities and bilateral pleural effusions. Findings most concerning for an infectious or inflammatory process including hypersensitivity pneumonitis versus pulmonary edema. 2. Findings concerning for a severe pathologic T10 vertebral body compression fracture. 3. Mediastinal and right hilar lymphadenopathy persists.    Dg Chest Port 1 View 09/07/2014: Progression of diffuse bilateral airspace disease consistent with edema or infection.     Dg Chest Port 1 View 09/08/2014:  No significant interval change in bilateral airspace disease when compared with the prior exam.     Dg Chest Port 1 View 09/09/2014: Diffuse airspace disease stable.  Improved right pleural effusion.     Dg Chest Port 1 View 09/10/2014: Stable diffuse bilateral mixed interstitial airspace process with slight worsening more focal airspace density in the right base. Findings are likely due to infection. Possible small left effusion.     Dg Chest Port 1 View 09/11/2014: 1. Persistent severe bilateral interstitial infiltrates without significant change from prior exam.  2.  Bibasilar atelectasis.     Dg Chest Port 1 View 09/12/2014: 1. Persistent severe bilateral interstitial infiltrates. No significant change from prior exam. 2. Bibasilar atelectasis.     Medical Consultants:    Palliative Care  Oncology  PCCM  Anti-Infectives:    Vancomycin  09/06/14---> 09/12/14  Zosyn 09/06/14---> 09/06/14  Cefepime 09/06/14--->   Subjective:   Emily West [facility] feels well, but fatigues easily.  Not dyspneic, but not getting up much.   Objective:     Filed Vitals:   09/17/14 0855 09/17/14 1000 09/17/14 1200 09/17/14 1400  BP:  119/56 125/63 144/63  Pulse:  75 74 75  Temp:   97.5 F (36.4 C)   TempSrc:   Axillary   Resp:  16 16 20   Height:      Weight:      SpO2: 97% 96% 100% 100%    Intake/Output Summary (Last 24 hours) at 09/17/14 1517 Last data filed at 09/17/14 1425  Gross per 24 hour  Intake   2530 ml  Output   1580 ml  Net    950 ml    Exam: Gen:  NAD Cardiovascular:  RRR, No M/R/G Respiratory:  Lungs diminished. Gastrointestinal:  Abdomen soft, NT/ND, + BS Extremities:  No C/E/C   Data Reviewed:    Labs: Basic Metabolic Panel:  Recent Labs Lab 09/11/14 0352 09/12/14 0350 09/13/14 0415 09/14/14 0359 09/15/14 0400  NA 139 138 135 130* 133*  K 3.2* 3.6 4.3 3.9 4.4  CL 101 95* 88* 90* 96  CO2 31 35* 40* 32 26  GLUCOSE 145* 159* 158* 150* 170*  BUN 27* 27* 27* 29* 40*  CREATININE 0.56 0.56 0.63 0.76 0.84  CALCIUM 8.4 8.4 8.6 8.7 9.0  MG  --  2.0 2.1 2.2 2.6*  PHOS  --  3.1 4.0 4.1 5.1*   GFR Estimated Creatinine Clearance: 77.2 mL/min (by C-G formula based on Cr of 0.84).  CBC:  Recent Labs Lab 09/11/14 0352 09/12/14 0350 09/13/14 0415 09/14/14 0359 09/15/14 0400  WBC 12.7* 13.3* 13.9* 17.2* 17.7*  HGB 10.2* 10.3* 10.3* 11.4* 11.6*  HCT 32.2* 32.3* 32.9* 34.6* 36.9  MCV 86.6 86.1 86.8 85.0 86.6  PLT 322 305 313 286 281     BNP (last 3 results)  Recent Labs  08/15/14 0425  PROBNP 114.8   CBG:  Recent Labs Lab 09/16/14 1942 09/16/14 2319 09/17/14 0334 09/17/14 0749 09/17/14 1232  GLUCAP 138* 150* 132* 120* 111*   Sepsis Labs:  Recent Labs Lab 09/12/14 0350 09/13/14 0415 09/14/14 0359 09/15/14 0400  WBC 13.3* 13.9* 17.2* 17.7*    Microbiology Recent Results (from the past 240 hour(s))  Culture, sputum-assessment     Status: None   Collection Time: 09/10/14  2:24 PM  Result Value Ref Range Status   Specimen Description SPUTUM  Final   Special Requests  NONE  Final   Sputum evaluation   Final    THIS SPECIMEN IS ACCEPTABLE. RESPIRATORY CULTURE REPORT TO FOLLOW.   Report Status 09/10/2014 FINAL  Final  Culture, respiratory (NON-Expectorated)     Status: None   Collection Time: 09/10/14  2:24 PM  Result Value Ref Range Status   Specimen Description SPUTUM  Final   Special Requests NONE  Final   Gram Stain   Final    FEW WBC PRESENT, PREDOMINANTLY PMN NO SQUAMOUS EPITHELIAL CELLS SEEN FEW YEAST RARE GRAM POSITIVE COCCI IN PAIRS    Culture   Final    MODERATE CANDIDA ALBICANS Performed at [facility]    Report Status 09/12/2014 FINAL  Final  Clostridium Difficile by PCR     Status: None   Collection Time: 09/11/14  9:00 PM  Result Value Ref Range Status   C difficile by pcr NEGATIVE NEGATIVE Final  Comment: Performed at [facility]  Culture, expectorated sputum-assessment     Status: None   Collection Time: 09/14/14  3:10 PM  Result Value Ref Range Status   Specimen Description SPUTUM  Final   Special Requests NONE  Final   Sputum evaluation   Final    THIS SPECIMEN IS ACCEPTABLE. RESPIRATORY CULTURE REPORT TO FOLLOW.   Report Status 09/14/2014 FINAL  Final  Culture, respiratory (NON-Expectorated)     Status: None   Collection Time: 09/14/14  3:10 PM  Result Value Ref Range Status   Specimen Description SPUTUM  Final   Special Requests NONE  Final   Gram Stain   Final    FEW WBC PRESENT,BOTH PMN AND MONONUCLEAR NO SQUAMOUS EPITHELIAL CELLS SEEN NO ORGANISMS SEEN Performed at [facility]    Culture   Final    MODERATE CANDIDA ALBICANS Performed at [facility]    Report Status 09/17/2014 FINAL  Final     Medications:   . antiseptic oral rinse  7 mL Mouth Rinse q12n4p  . ARIPiprazole  2 mg Oral Daily  . chlorhexidine  15 mL Mouth Rinse BID  . enoxaparin (LOVENOX) injection  40 mg Subcutaneous Q24H  . feeding supplement (RESOURCE BREEZE)  1 Container Oral TID BM  . ferrous  sulfate  325 mg Oral BID WC  . FLUoxetine  40 mg Oral BID  . folic acid  1 mg Oral Daily  . insulin aspart  0-15 Units Subcutaneous 6 times per day  . OxyCODONE  30 mg Oral 3 times per day  . pantoprazole  40 mg Oral Daily  . polyethylene glycol  17 g Oral Daily  . predniSONE  40 mg Oral Q breakfast  . rosuvastatin  10 mg Oral QHS  . senna-docusate  2 tablet Oral BID  . sodium chloride  10-40 mL Intracatheter Q12H  . tiotropium  18 mcg Inhalation Daily   Continuous Infusions: . sodium chloride 10 mL/hr at 09/15/14 0600  . dextrose 5 % and 0.45 % NaCl with KCl 40 mEq/L 100 mL/hr at 09/17/14 1328    Time spent: 25 minutes.    LOS: 11 days   [facility] [facility] Pager [phone]. If unable to reach me by pager, please call my cell phone at [phone].  *Please refer to [email or web address], password [email or web address] to get updated schedule on who will round on this patient, as hospitalists switch teams weekly. If 7PM-7AM, please contact night-coverage at [email or web address], password [email or web address] for any overnight needs.  09/17/2014, 3:17 PM

## 2014-09-17 NOTE — Progress Notes (Signed)
ANTIBIOTIC CONSULT NOTE - Follow up  Pharmacy Consult for Cefepime Indication: rule out pneumonia  No Known Allergies  Patient Measurements: Height: 5\' 5"  (165.1 cm) Weight: 194 lb 10.7 oz (88.3 kg) IBW/kg (Calculated) : 57  Vital Signs: Temp: 97.5 F (36.4 C) (01/03 1200) Temp Source: Axillary (01/03 1200) BP: 125/63 mmHg (01/03 1200) Pulse Rate: 74 (01/03 1200) Intake/Output from previous day: 01/02 0701 - 01/03 0700 In: 2790 [P.O.:120; I.V.:2470; IV Piggyback:200] Out: 1545 [MWUXL:2440] Intake/Output from this shift: Total I/O In: 510 [I.V.:510] Out: 330 [Urine:330]  Labs:  Recent Labs  09/15/14 0400  WBC 17.7*  HGB 11.6*  PLT 281  CREATININE 0.84   Estimated Creatinine Clearance: 77.2 mL/min (by C-G formula based on Cr of 0.84). No results for input(s): VANCOTROUGH, VANCOPEAK, VANCORANDOM, GENTTROUGH, GENTPEAK, GENTRANDOM, TOBRATROUGH, TOBRAPEAK, TOBRARND, AMIKACINPEAK, AMIKACINTROU, AMIKACIN in the last 72 hours.   Microbiology: Recent Results (from the past 720 hour(s))  Blood culture (routine x 2)     Status: None   Collection Time: 09/06/14  3:32 PM  Result Value Ref Range Status   Specimen Description BLOOD LEFT FOREARM  Final   Special Requests BOTTLES DRAWN AEROBIC AND ANAEROBIC 5CC  Final   Culture  Setup Time   Final    09/06/2014 20:48 Performed at [facility]    Culture   Final    NO GROWTH 5 DAYS Performed at [facility]    Report Status 09/12/2014 FINAL  Final  Blood culture (routine x 2)     Status: None   Collection Time: 09/06/14  3:33 PM  Result Value Ref Range Status   Specimen Description BLOOD LEFT ANTECUBITAL  Final   Special Requests BOTTLES DRAWN AEROBIC AND ANAEROBIC 5CC  Final   Culture  Setup Time   Final    09/06/2014 20:48 Performed at [place]   Final    NO GROWTH 5 DAYS Performed at [facility]    Report Status 09/12/2014 FINAL  Final  MRSA PCR Screening      Status: None   Collection Time: 09/06/14  5:59 PM  Result Value Ref Range Status   MRSA by PCR NEGATIVE NEGATIVE Final    Comment:        The GeneXpert MRSA Assay (FDA approved for NASAL specimens only), is one component of a comprehensive MRSA colonization surveillance program. It is not intended to diagnose MRSA infection nor to guide or monitor treatment for MRSA infections.   Culture, sputum-assessment     Status: None   Collection Time: 09/10/14  2:24 PM  Result Value Ref Range Status   Specimen Description SPUTUM  Final   Special Requests NONE  Final   Sputum evaluation   Final    THIS SPECIMEN IS ACCEPTABLE. RESPIRATORY CULTURE REPORT TO FOLLOW.   Report Status 09/10/2014 FINAL  Final  Culture, respiratory (NON-Expectorated)     Status: None   Collection Time: 09/10/14  2:24 PM  Result Value Ref Range Status   Specimen Description SPUTUM  Final   Special Requests NONE  Final   Gram Stain   Final    FEW WBC PRESENT, PREDOMINANTLY PMN NO SQUAMOUS EPITHELIAL CELLS SEEN FEW YEAST RARE GRAM POSITIVE COCCI IN PAIRS    Culture   Final    MODERATE CANDIDA ALBICANS Performed at [facility]    Report Status 09/12/2014 FINAL  Final  Clostridium Difficile by PCR     Status: None   Collection  Time: 09/11/14  9:00 PM  Result Value Ref Range Status   C difficile by pcr NEGATIVE NEGATIVE Final    Comment: Performed at [facility]  Culture, expectorated sputum-assessment     Status: None   Collection Time: 09/14/14  3:10 PM  Result Value Ref Range Status   Specimen Description SPUTUM  Final   Special Requests NONE  Final   Sputum evaluation   Final    THIS SPECIMEN IS ACCEPTABLE. RESPIRATORY CULTURE REPORT TO FOLLOW.   Report Status 09/14/2014 FINAL  Final  Culture, respiratory (NON-Expectorated)     Status: None   Collection Time: 09/14/14  3:10 PM  Result Value Ref Range Status   Specimen Description SPUTUM  Final   Special Requests NONE  Final    Gram Stain   Final    FEW WBC PRESENT,BOTH PMN AND MONONUCLEAR NO SQUAMOUS EPITHELIAL CELLS SEEN NO ORGANISMS SEEN Performed at [facility]    Culture   Final    MODERATE CANDIDA ALBICANS Performed at [facility]    Report Status 09/17/2014 FINAL  Final   Assessment: 64 yo female presents with abrupt onset of SHOB. She was recently diagnosed with metastatic stage IV lung cancer during previous admission 08/08/14-08/22/14. She also notes pain in her right hip after felt something pop after getting out of the car yesterday. CXR shows significant increase in bilateral infiltrates, a portion which may be related to the patient's underlying neoplastic history. CTA reveals no PE but ground glass appearance with infectious vs inflammatory etiology.  12/23 >> Zosyn x 1 12/23 >> Vancomycin >> 12/29 12/23 >> Cefepime >> 12/31 >> Diflucan >> 1/1  Tmax: afeb since fever at time of admission WBC: no labs since 1/1 elevated(steroids) Renal: no labs since 1/1,  SCr improved, below baseline; CrCl 77CG/N(using SCr = 0.8) PCT < 0.5, trending down as of 12/25  12/23 Blood x 2: neg 12/24 influenza: neg 12/23 MRSA: neg Legionella Ag: neg S. pneumo Ag: neg 12/27 sputum: rare GPC, mod candida albicans 12/28 Cdiff PCR: neg 12/31 sputum: sent  Goal of Therapy:  Appropriate antibiotic dosing for renal function; eradication of infection  Plan:  Day #11 Cefepime  Cefepime 1g IV q8 remains appropriate for renal function  Consider stopping Cefepime if clinically appropriate  Doreene Eland, PharmD, BCPS.   Pager: [phone]  09/17/2014,1:32 PM.

## 2014-09-18 ENCOUNTER — Ambulatory Visit: Payer: BC Managed Care – PPO | Admitting: Radiation Oncology

## 2014-09-18 LAB — GLUCOSE, CAPILLARY
GLUCOSE-CAPILLARY: 163 mg/dL — AB (ref 70–99)
GLUCOSE-CAPILLARY: 101 mg/dL — AB (ref 70–99)
GLUCOSE-CAPILLARY: 156 mg/dL — AB (ref 70–99)
Glucose-Capillary: 94 mg/dL (ref 70–99)
Glucose-Capillary: 134 mg/dL — ABNORMAL HIGH (ref 70–99)

## 2014-09-18 NOTE — Progress Notes (Signed)
Progress Note   Emily West DOA: 09/06/2014 PCP: [name], MD   Brief Narrative:   Emily West is an 64 y.o. female with h/o recent diagnosis of stage 4 NSCLC , admitted for acute hypoxic respiratory failure presumed to be infectious. She was started on high dose steroids and IV antibiotics and had ongoing high oxygen requirements since admission. PCCM and oncology consulted. Patient's husband requested palliative care meeting for goals of care, and she is now a DO NOT RESUSCITATE. Barrier to discharge is ongoing high oxygen requirement/activity intolerance. She is reluctant to go home and at this time, does not want to consider a rehabilitation facility.  Assessment/Plan:   Principal problem:  Severe sepsis secondary to HCAP / Acute hypoxic respiratory failure -Admitted to step down, still with high oxygen requirement, on oximyzer.  -Thought to have pneumonitis from infectious process versus lymphangitic spread of cancer.  S/P 10 days of treatment with Cefepime and IV steroids for inflammation (now on prednisone--- with plans to taper over 7 days.  Continue Spiriva.  Diuresed over 8 L during her hospitalization. Sputum cultures grew Candida, but this was not felt to be a infection. -Continue Incentive spirometry,nebulizer treatments if needed. Keep sats greater than 90%.  -PCCM following and felt she could go home once her oxygen needs are established and when it is confirmed that she can tolerate increased activity on current Oxymizer. -Anticipate D/C home soon. Did not feel ready to go today. Have requested RN ambulate patient every shift and document tolerance to activity and how far she walked. She has been working with physical therapy for several days now. -Palliative care to re-address goals of care tomorrow. It would be helpful to have physician input at that meeting, and if Dr. [name] cannot attend, would recommend that  hospitalist attend (I will not be here, unfortunately).  Active problems:  Steroid induced hyperglycemia -Currently on moderate scale SSI Q AC/HS. -CBGs 120-150.    NSCLC with bone and brain mets: - s/p palliative radiation treatment. Chemo scheduled for 12/30. But on hold for now.  - Dr [name] on board.  Seen by palliative care team as well.  Now a DNR.  Hypokalemia  -Repleted as needed.  Anemia -Normocytic and at baseline.   Mild leukocytosis: -Possibly from the infection.   DVT prophlylaxis -Continue Lovenox.    Code Status: DNR Family Communication: Son at bedside 09/17/13, no family present today. Disposition Plan: Home when stable. Declines my offer for rehabilitation placement.   IV Access:    PICC placed 09/12/14   Procedures and diagnostic studies:   Dg Chest 1 View 09/06/2014: Significant increase in bilateral infiltrates a portion which may be related to the patient's underlying neoplastic history.    Dg Hip Complete Right 09/06/2014: No acute abnormality noted.     Ct Angio Chest Pe W/cm &/or Wo Cm 09/06/2014: 1. Interval development of bilateral diffuse patchy airspace disease with ground-glass opacities and bilateral pleural effusions. Findings most concerning for an infectious or inflammatory process including hypersensitivity pneumonitis versus pulmonary edema. 2. Findings concerning for a severe pathologic T10 vertebral body compression fracture. 3. Mediastinal and right hilar lymphadenopathy persists.    Dg Chest Port 1 View 09/07/2014: Progression of diffuse bilateral airspace disease consistent with edema or infection.     Dg Chest Port 1 View 09/08/2014:  No significant interval change in bilateral airspace disease when compared with the prior exam.     Dg Chest [facility]  1 View 09/09/2014: Diffuse airspace disease stable.  Improved right pleural effusion.     Dg Chest Port 1 View 09/10/2014: Stable diffuse bilateral mixed interstitial airspace  process with slight worsening more focal airspace density in the right base. Findings are likely due to infection. Possible small left effusion.     Dg Chest Port 1 View 09/11/2014: 1. Persistent severe bilateral interstitial infiltrates without significant change from prior exam.  2.  Bibasilar atelectasis.     Dg Chest Port 1 View 09/12/2014: 1. Persistent severe bilateral interstitial infiltrates. No significant change from prior exam. 2. Bibasilar atelectasis.     Medical Consultants:    Palliative Care  Oncology  PCCM  Anti-Infectives:    Vancomycin 09/06/14---> 09/12/14  Zosyn 09/06/14---> 09/06/14  Cefepime 09/06/14---> 09/17/14   Subjective:   [facility] feels well, but fatigues easily.  Not overtly dyspneic, but does not feel quite ready to go home. I discussed the possibility of rehabilitation placement with her, but she was not enthusiastic about this idea. Appetite is reasonable. Bowels are moving. No nausea/vomiting.   Objective:    Filed Vitals:   09/17/14 1741 09/17/14 2045 09/18/14 0523 09/18/14 1353  BP: 140/57 123/59 132/57 108/56  Pulse: 69 72 74 76  Temp: 97.5 F (36.4 C) 97.8 F (36.6 C) 97.8 F (36.6 C) 97.6 F (36.4 C)  TempSrc: Axillary Oral Oral Oral  Resp: 18 17 16 18   Height:      Weight:      SpO2: 100% 100% 99% 100%    Intake/Output Summary (Last 24 hours) at 09/18/14 1522 Last data filed at 09/18/14 1740  Gross per 24 hour  Intake    220 ml  Output   1301 ml  Net  -1081 ml    Exam: Gen:  NAD Cardiovascular:  RRR, No M/R/G Respiratory:  Lungs diminished. Gastrointestinal:  Abdomen soft, NT/ND, + BS Extremities:  No C/E/C   Data Reviewed:    Labs: Basic Metabolic Panel:  Recent Labs Lab 09/12/14 0350 09/13/14 0415 09/14/14 0359 09/15/14 0400  NA 138 135 130* 133*  K 3.6 4.3 3.9 4.4  CL 95* 88* 90* 96  CO2 35* 40* 32 26  GLUCOSE 159* 158* 150* 170*  BUN 27* 27* 29* 40*  CREATININE 0.56 0.63 0.76 0.84    CALCIUM 8.4 8.6 8.7 9.0  MG 2.0 2.1 2.2 2.6*  PHOS 3.1 4.0 4.1 5.1*   GFR Estimated Creatinine Clearance: 77.2 mL/min (by C-G formula based on Cr of 0.84).  CBC:  Recent Labs Lab 09/12/14 0350 09/13/14 0415 09/14/14 0359 09/15/14 0400  WBC 13.3* 13.9* 17.2* 17.7*  HGB 10.3* 10.3* 11.4* 11.6*  HCT 32.3* 32.9* 34.6* 36.9  MCV 86.1 86.8 85.0 86.6  PLT 305 313 286 281     BNP (last 3 results)  Recent Labs  08/15/14 0425  PROBNP 114.8   CBG:  Recent Labs Lab 09/17/14 1232 09/17/14 1630 09/17/14 2043 09/18/14 0723 09/18/14 1120  GLUCAP 111* 163* 158* 94 101*   Sepsis Labs:  Recent Labs Lab 09/12/14 0350 09/13/14 0415 09/14/14 0359 09/15/14 0400  WBC 13.3* 13.9* 17.2* 17.7*    Microbiology Recent Results (from the past 240 hour(s))  Culture, sputum-assessment     Status: None   Collection Time: 09/10/14  2:24 PM  Result Value Ref Range Status   Specimen Description SPUTUM  Final   Special Requests NONE  Final   Sputum evaluation   Final    THIS SPECIMEN IS ACCEPTABLE.  RESPIRATORY CULTURE REPORT TO FOLLOW.   Report Status 09/10/2014 FINAL  Final  Culture, respiratory (NON-Expectorated)     Status: None   Collection Time: 09/10/14  2:24 PM  Result Value Ref Range Status   Specimen Description SPUTUM  Final   Special Requests NONE  Final   Gram Stain   Final    FEW WBC PRESENT, PREDOMINANTLY PMN NO SQUAMOUS EPITHELIAL CELLS SEEN FEW YEAST RARE GRAM POSITIVE COCCI IN PAIRS    Culture   Final    MODERATE CANDIDA ALBICANS Performed at [facility]    Report Status 09/12/2014 FINAL  Final  Clostridium Difficile by PCR     Status: None   Collection Time: 09/11/14  9:00 PM  Result Value Ref Range Status   C difficile by pcr NEGATIVE NEGATIVE Final    Comment: Performed at [facility]  Culture, expectorated sputum-assessment     Status: None   Collection Time: 09/14/14  3:10 PM  Result Value Ref Range Status   Specimen Description  SPUTUM  Final   Special Requests NONE  Final   Sputum evaluation   Final    THIS SPECIMEN IS ACCEPTABLE. RESPIRATORY CULTURE REPORT TO FOLLOW.   Report Status 09/14/2014 FINAL  Final  Culture, respiratory (NON-Expectorated)     Status: None   Collection Time: 09/14/14  3:10 PM  Result Value Ref Range Status   Specimen Description SPUTUM  Final   Special Requests NONE  Final   Gram Stain   Final    FEW WBC PRESENT,BOTH PMN AND MONONUCLEAR NO SQUAMOUS EPITHELIAL CELLS SEEN NO ORGANISMS SEEN Performed at [facility]    Culture   Final    MODERATE CANDIDA ALBICANS Performed at [facility]    Report Status 09/17/2014 FINAL  Final     Medications:   . antiseptic oral rinse  7 mL Mouth Rinse q12n4p  . ARIPiprazole  2 mg Oral Daily  . chlorhexidine  15 mL Mouth Rinse BID  . enoxaparin (LOVENOX) injection  40 mg Subcutaneous Q24H  . feeding supplement (RESOURCE BREEZE)  1 Container Oral TID BM  . ferrous sulfate  325 mg Oral BID WC  . FLUoxetine  40 mg Oral BID  . folic acid  1 mg Oral Daily  . insulin aspart  0-15 Units Subcutaneous TID WC  . insulin aspart  0-5 Units Subcutaneous QHS  . OxyCODONE  30 mg Oral 3 times per day  . pantoprazole  40 mg Oral Daily  . polyethylene glycol  17 g Oral Daily  . predniSONE  40 mg Oral Q breakfast  . rosuvastatin  10 mg Oral QHS  . senna-docusate  2 tablet Oral BID  . sodium chloride  10-40 mL Intracatheter Q12H  . tiotropium  18 mcg Inhalation Daily   Continuous Infusions: . sodium chloride 10 mL/hr at 09/17/14 1739    Time spent: 25 minutes.    LOS: 12 days   [facility] Pager [phone]. If unable to reach me by pager, please call my cell phone at [phone].  *Please refer to [email or web address], password [email or web address] to get updated schedule on who will round on this patient, as hospitalists switch teams weekly. If 7PM-7AM, please contact night-coverage at [email or web address], password [email or web address] for any overnight  needs.  09/18/2014, 3:22 PM

## 2014-09-18 NOTE — Progress Notes (Signed)
   Name: Emily West [facility] MRN: [id number] DOB: [birth year]    ADMISSION DATE:  09/06/2014 CONSULTATION DATE:  09/06/2014  REFERRING MD :  Karleen Hampshire  CHIEF COMPLAINT:  SOB  BRIEF PATIENT DESCRIPTION:  64 year old female with recent dx stage IV NSCLC admitted 12/23 for hypoxic respiratory failure 2/2 presumed CAP. Despite high flow O2 she remained hypoxic. PCCM asked to consult. - was to start chemo on 12/30 PRIOR THERAPY:  1) Stereotactic radiotherapy to the metastatic bone lesions at T10 and L3 under Dr. [name] and Dr. Vertell Limber. 2) stereotactic radiotherapy to a solitary brain lesion under the care of Dr. [name].  SIGNIFICANT EVENTS  12/25 bipap overnight 1/2 made DNR  STUDIES:  Echo 12/1 > LVEF 55-60% CT chest 12/23 > bilateral GGO with bilateral pleural effusions. Pulm edema vs hypersensitivity pneumonitis. Pathologic T10 vertebral body fx.    SUBJECTIVE: Feels ok. Wants to go home   VITAL SIGNS: Temp:  [97.5 F (36.4 C)-97.8 F (36.6 C)] 97.8 F (36.6 C) (01/04 0523) Pulse Rate:  [69-75] 74 (01/04 0523) Resp:  [16-20] 16 (01/04 0523) BP: (115-144)/(50-63) 132/57 mmHg (01/04 0523) SpO2:  [99 %-100 %] 99 % (01/04 0523) 8 liters oximizer   PHYSICAL EXAMINATION:  Gen: no acute distress, on oximizer HEENT: NCAT EOMi PULM: crackles bilaterally, good air movement, no wheezing CV: RRR, no mgr AB: BS+, soft, nontender Ext: warm, no edema Neuro: A&Ox4, maew    Recent Labs Lab 09/13/14 0415 09/14/14 0359 09/15/14 0400  NA 135 130* 133*  K 4.3 3.9 4.4  CL 88* 90* 96  CO2 40* 32 26  BUN 27* 29* 40*  CREATININE 0.63 0.76 0.84  GLUCOSE 158* 150* 170*    Recent Labs Lab 09/13/14 0415 09/14/14 0359 09/15/14 0400  HGB 10.3* 11.4* 11.6*  HCT 32.9* 34.6* 36.9  WBC 13.9* 17.2* 17.7*  PLT 313 286 281   No results found. ASSESSMENT / PLAN:   Acute hypoxemic respiratory failure due to HCAP and most likely lymphangitic spread of Stage IV Non Small Cell Cancer    Poor prognosis, Not likely to improve significantly in terms of oxygenation P:   - Titrate FiO2 to maintatin SpO2 > 90%. - prednisone: wean off over 7 days - Incentive spirometry. - Out of bed - Ambulate as able - Oxymizer, hopefully can set up for home-->will see if we can get medallion oximizer   - PRN albuterol. - Continue spiriva.   H/o HTN Chronic diastolic heart failure, doubt exacerbation, low BNP >8 liters negative for hospital stay P:  - Telemetry. - BP meds per primary service - Continue statin. - Goal net even to slightly negative   Not much to add. Could go home once O2 needs established and we can confirm she can tolerate increased activity on current O2 . We will be available as needed.   Erick Colace ACNP-BC [place] Pager # [phone] OR # [phone] if no answer   Attending Note:  I have examined patient, reviewed labs, studies and notes. I have discussed the case with Jerrye Bushy and I agree with the data and plans as amended above.   Baltazar Apo, MD, PhD 09/18/2014, 11:09 AM [facility] [phone] or if no answer [phone]

## 2014-09-19 ENCOUNTER — Ambulatory Visit (HOSPITAL_COMMUNITY): Admission: RE | Admit: 2014-09-19 | Payer: BC Managed Care – PPO | Source: Ambulatory Visit

## 2014-09-19 LAB — GLUCOSE, CAPILLARY
Glucose-Capillary: 86 mg/dL (ref 70–99)
Glucose-Capillary: 117 mg/dL — ABNORMAL HIGH (ref 70–99)
Glucose-Capillary: 190 mg/dL — ABNORMAL HIGH (ref 70–99)
Glucose-Capillary: 163 mg/dL — ABNORMAL HIGH (ref 70–99)

## 2014-09-19 NOTE — Progress Notes (Signed)
Notified by [name] of pt/husband request for [facility] services at discharge. Writer spoke with pt and husband Emily West at bedside to discuss hospice philosophy at EOL, services and team approach to care. Emily West was up in the chair alert, oriented x3; she stated I think we jumped to this too soon; I feel good and I want to go home and get strong, I want treatment and plan to see Dr. [name] to see what can be offered; I would want to come back to the hospital to treat the treatable.  Discussed separately with husband outside of room; [name] stated I want to give her the opportunity to go home and see if she can work with PT I don't want her to think we are giving up; Emily West wants to move forward using home health [facility] "for now" and is aware that [facility] can be called to come to the home at any point in time should their decisions change. [facility]erral [facility] made aware; CMRN Emily West notified of above and will follow up with pt/husband Please contact if [facility] can be of any assistance moving forward Thank you. Danton Sewer, RN MSN [facility] Liaison [phone]

## 2014-09-19 NOTE — Progress Notes (Addendum)
CARE MANAGEMENT NOTE 09/19/2014  Patient:  Emily West,Emily West   Account Number:  [id number]  Date Initiated:  09/13/2014  Documentation initiated by:  [facility]  Subjective/Objective Assessment:   adm: SOB/recent dx stage IV NSCLC admitted 12/23 for hypoxic respiratory failure     Action/Plan:   discharge planning   Anticipated DC Date:  09/14/2014   Anticipated DC Plan:        DC Planning Services  CM consult      Choice offered to / List presented to:  C-1 Patient           Status of service:  In process, will continue to follow Medicare Important Message given?   (If response is "NO", the following Medicare IM given date fields will be blank) Date Medicare IM given:   Medicare IM given by:   Date Additional Medicare IM given:   Additional Medicare IM given by:    Discharge Disposition:    Per UR Regulation:    If discussed at Long Length of Stay Meetings, dates discussed:    Comments:  09/19/14 Edwyna Shell RN BSN CM [phone] Patient and family have decided to go home with [facility] services and resume care with [facility]. Communicated possible referral to [facility] liaison to confirm follow up care in the home with home O2 at 8% on oximizer. Received confirmation the [facility] will resume care upon discharge. L/m with [facility] DME rep regarding DME orders, that family only needs wheelchair and would like it to be delivered to the home. Will continue to follow   09/19/14 Edwyna Shell RN BSN CM [phone] Attended [place] meeting with Palliative NP in which spouse and patient decided to go home with [facility]. Palliative NP stated that choice had previously been discussed the patient and spouse and have already chosen [facility]. Palliative NP discussed the need for caregivers in the home to support the spouse so this CM provided a Private duty sitter agency list and encouraged them to set up caregivers for prior to discharge. Spouse stated that they only need a  wheelchair to be delivered to the house and that [facility] services would have to be cancelled. This CM communicated referral for hospice to [facility] liaison.  09/18/14 Edwyna Shell RN BSN CM [phone] transfer from ICU to [place] 09/17/14, [place] meeting shceduled with patient and family for 09/19/14, will continue to follow  09/13/14 CM HAS RECEIVED CALL FROM PALLIATIVE AND PT IS NOT BEEN REFERRED FOR HOSPICE AT [place]; goc HAVE NOT BEEN ESTABLISHED AND ORDERS HAVE NOT BEEN PLACED FOR PALLIATIVE; CALL PLACED TO [name] to apologize for misinterpretation.  Cm will continue to follow for disposition.  Emily West, BSN, [place]. 09/13/14 10:00 CM met with pt and pt's daughter in room to discuss oximyzers (from [facility]).  Pt's husband, Emily West,  had stepped out and can be reached at [phone]. Disposition was discussed and pt and daughter informed CM of choice to go home with hospice.  Family had stated they had a meeting with [name].  Pt [facility] [place].  Address and contact information verified by pt.  Pt is oxygen dependent and CM received call for oxymizers to be brought to room from [facility] [place].  CM brought both styles to room and RN called Respiratory to place.  CM called Referral to [place] and spoke with [name] who states she will relay information to [place].  No other CM  needs were communicated.  Emily West, BSN, CM [phone].

## 2014-09-19 NOTE — Progress Notes (Signed)
Progress Note from the Palliative Medicine Team at [place]:  -patient "feels good" and hoping to go home asap  -meet at bedside with patient and her husband Cephas Darby CMRN present  -continued conversation with patient regarding treatment options,  GOC and disposition.  Continues to require high volume o2 via oximyzer, SOB on minimal exertion     Objective: No Known Allergies Scheduled Meds: . antiseptic oral rinse  7 mL Mouth Rinse q12n4p  . ARIPiprazole  2 mg Oral Daily  . chlorhexidine  15 mL Mouth Rinse BID  . enoxaparin (LOVENOX) injection  40 mg Subcutaneous Q24H  . feeding supplement (RESOURCE BREEZE)  1 Container Oral TID BM  . ferrous sulfate  325 mg Oral BID WC  . FLUoxetine  40 mg Oral BID  . folic acid  1 mg Oral Daily  . insulin aspart  0-15 Units Subcutaneous TID WC  . insulin aspart  0-5 Units Subcutaneous QHS  . OxyCODONE  30 mg Oral 3 times per day  . pantoprazole  40 mg Oral Daily  . polyethylene glycol  17 g Oral Daily  . predniSONE  40 mg Oral Q breakfast  . rosuvastatin  10 mg Oral QHS  . senna-docusate  2 tablet Oral BID  . sodium chloride  10-40 mL Intracatheter Q12H  . tiotropium  18 mcg Inhalation Daily   Continuous Infusions: . sodium chloride 10 mL/hr at 09/17/14 1739   PRN Meds:.albuterol, ALPRAZolam, ondansetron (ZOFRAN) IV, oxyCODONE, sodium chloride  BP 129/56 mmHg  Pulse 72  Temp(Src) 97.9 F (36.6 C) (Oral)  Resp 18  Ht 5\' 5"  (1.651 m)  Wt 88.3 kg (194 lb 10.7 oz)  BMI 32.39 kg/m2  SpO2 99%   PPS:30 % at best  Pain Score:denies    Intake/Output Summary (Last 24 hours) at 09/19/14 1507 Last data filed at 09/19/14 0606  Gross per 24 hour  Intake    240 ml  Output   1451 ml  Net  -1211 ml      Physical Exam:  General: Chronically ill appearing, dyspneic on minimal exertion HEENT:  moist buccal membranes Chest:   Diminished throughout, O2 sats drop rapidly without O2 support CVS: RRR Abdomen: soft NT  +BS Neuro: alert and oriented X3   Labs: CBC    Component Value Date/Time   WBC 17.7* 09/15/2014 0400   WBC 8.6 09/01/2014 0835   RBC 4.26 09/15/2014 0400   RBC 3.82 09/01/2014 0835   RBC 3.64* 08/15/2014 0425   HGB 11.6* 09/15/2014 0400   HGB 10.6* 09/01/2014 0835   HCT 36.9 09/15/2014 0400   HCT 34.0* 09/01/2014 0835   PLT 281 09/15/2014 0400   PLT 299 09/01/2014 0835   MCV 86.6 09/15/2014 0400   MCV 89.0 09/01/2014 0835   MCH 27.2 09/15/2014 0400   MCH 27.7 09/01/2014 0835   MCHC 31.4 09/15/2014 0400   MCHC 31.2* 09/01/2014 0835   RDW 14.3 09/15/2014 0400   RDW 14.2 09/01/2014 0835   LYMPHSABS 1.1 09/06/2014 1208   LYMPHSABS 1.2 09/01/2014 0835   MONOABS 1.0 09/06/2014 1208   MONOABS 0.6 09/01/2014 0835   EOSABS 0.1 09/06/2014 1208   EOSABS 0.0 09/01/2014 0835   BASOSABS 0.0 09/06/2014 1208   BASOSABS 0.0 09/01/2014 0835    BMET    Component Value Date/Time   NA 133* 09/15/2014 0400   NA 138 09/01/2014 0835   K 4.4 09/15/2014 0400   K 4.8 09/01/2014 0835   CL 96  09/15/2014 0400   CO2 26 09/15/2014 0400   CO2 25 09/01/2014 0835   GLUCOSE 170* 09/15/2014 0400   GLUCOSE 120 09/01/2014 0835   BUN 40* 09/15/2014 0400   BUN 13.5 09/01/2014 0835   CREATININE 0.84 09/15/2014 0400   CREATININE 0.8 09/01/2014 0835   CALCIUM 9.0 09/15/2014 0400   CALCIUM 8.9 09/01/2014 0835   GFRNONAA 74* 09/15/2014 0400   GFRAA 85* 09/15/2014 0400    CMP     Component Value Date/Time   NA 133* 09/15/2014 0400   NA 138 09/01/2014 0835   K 4.4 09/15/2014 0400   K 4.8 09/01/2014 0835   CL 96 09/15/2014 0400   CO2 26 09/15/2014 0400   CO2 25 09/01/2014 0835   GLUCOSE 170* 09/15/2014 0400   GLUCOSE 120 09/01/2014 0835   BUN 40* 09/15/2014 0400   BUN 13.5 09/01/2014 0835   CREATININE 0.84 09/15/2014 0400   CREATININE 0.8 09/01/2014 0835   CALCIUM 9.0 09/15/2014 0400   CALCIUM 8.9 09/01/2014 0835   PROT 6.6 09/06/2014 1208   PROT 6.5 09/01/2014 0835   ALBUMIN 2.5*  09/06/2014 1208   ALBUMIN 2.5* 09/01/2014 0835   AST 21 09/06/2014 1208   AST 17 09/01/2014 0835   ALT 12 09/06/2014 1208   ALT 20 09/01/2014 0835   ALKPHOS 137* 09/06/2014 1208   ALKPHOS 172* 09/01/2014 0835   BILITOT 0.9 09/06/2014 1208   BILITOT 0.60 09/01/2014 0835   GFRNONAA 74* 09/15/2014 0400   GFRAA 85* 09/15/2014 0400     Assessment and Plan: Code Status:   DNR/DNI-previously documented  1. Symptom Control:  Weakness: PT as toelrated Dyspnea  - O2 Oximyzer for comfort and anticipation of dc home   2. Psycho/Social:  Emotional support offered to patient and family.  I worry that patient and husband continue to have poor prognostic awareness and understanding of home needs and anticipated trajectory of the situation.  3. Spiritual:  Chaplain involved   4.   Disposition:  Today both patient and her husband clearly verbalize decision for dc home and their hope for hospice services.   They speak to understanding that no chemotherapy treatment is being offered "at this time", however in an unlikely scenario patient dramatically improves they would consider oncology input.    Long discussion regarding home care needs specific to anticipated intensive hands on nursing needs.  Discussed with husband limitations of hospice benefit and educated on accessing out of pocket  CNS/home health aides.   Patient Documents Completed or Given: Document Given Completed  Advanced Directives Pkt    35MOST X   DNR    Gone from My Sight    Hard Choices      Time In Time Out Total Time Spent with Patient Total Overall Time  1100 1200     35 min 35  min    Greater than 50%  of this time was spent counseling and coordinating care related to the above assessment and plan. PMT will continue to support holistically.  [name] NP  Palliative Medicine Team Team Phone # [phone] Pager [phone]   1

## 2014-09-19 NOTE — Progress Notes (Signed)
[facility] PROGRESS NOTE:   Emily West [facility] WUJ:[id number] DOB: [birth year]    PCP:   [name], MD   Emily West is an 63 y.o. female with h/o recent diagnosis of stage 4 NSCLC , admitted for acute hypoxic respiratory failure presumed to be infectious. She was started on high dose steroids and IV antibiotics and had ongoing high oxygen requirements since admission. PCCM and oncology consulted. Patient's husband requested palliative care meeting for goals of care, and she is now a DO NOT RESUSCITATE. Barrier to discharge is ongoing high oxygen requirement/activity intolerance. She is reluctant to go home and at this time, does not want to consider a rehabilitation facility.  Hospice was consulted and hospice RN is going to talk with the family to see if it is something they would like to participate in.  According to the oncologist's note, chemotherapy is still being offered if she improves. It appears that family is still interested in further treatment if there is any need for it.  Rewiew of Systems:  Constitutional: Negative for malaise, fever and chills. No significant weight loss or weight gain Eyes: Negative for eye pain, redness and discharge, diplopia, visual changes, or flashes of light. ENMT: Negative for ear pain, hoarseness, nasal congestion, sinus pressure and sore throat. No headaches; tinnitus, drooling, or problem swallowing. Cardiovascular: Negative for chest pain, palpitations, diaphoresis,  and peripheral edema. ; No orthopnea, PND Respiratory: Negative for cough, hemoptysis, wheezing and stridor. No pleuritic chestpain. Gastrointestinal: Negative for nausea, vomiting, diarrhea, constipation, abdominal pain, melena, blood in stool, hematemesis, jaundice and rectal bleeding.    Genitourinary: Negative for frequency, dysuria, incontinence,flank pain and hematuria; Musculoskeletal: Negative for back pain and neck pain. Negative for swelling and trauma.;  Skin: .  Negative for pruritus, rash, abrasions, bruising and skin lesion.; ulcerations Neuro: Negative for headache, lightheadedness and neck stiffness. Negative for weakness, altered level of consciousness , altered mental status, extremity weakness, burning feet, involuntary movement, seizure and syncope.  Psych: negative for anxiety, depression, insomnia, tearfulness, panic attacks, hallucinations, paranoia, suicidal or homicidal ideation    Past Medical History  Diagnosis Date  . History of suicidal ideation   . HTN (hypertension)   . HLD (hyperlipidemia)   . Shortness of breath   . Anxiety and depression   . Menopause   . Benign hematuria   . Impaired fasting glucose   . Other abnormal glucose   . Cancer     Past Surgical History  Procedure Laterality Date  . Colonoscopy  11/21/2003     rare early left-sided diverticula remaining, low sigmoid anastomosis, internal and external hemorrhoids,   . Cystoscopy    . Lymph node biopsy Right 08/09/2014    Procedure: Right axillary lymph node biopsy;  Surgeon: Alphonsa Overall, MD;  Location: [facility];  Service: General;  Laterality: Right;    Medications:  HOME MEDS: Prior to Admission medications   Medication Sig Start Date End Date Taking? Authorizing Provider  ALPRAZolam Duanne Moron) 0.5 MG tablet Take 1 tablet (0.5 mg total) by mouth 3 (three) times daily as needed for anxiety. 09/04/14  Yes Curt Bears, MD  ARIPiprazole (ABILIFY) 2 MG tablet Take 2 mg by mouth daily.   Yes Historical Provider, MD  cyclobenzaprine (FLEXERIL) 10 MG tablet Take 1 tablet (10 mg total) by mouth at bedtime. 08/22/14  Yes Debbe Odea, MD  ENSURE PLUS (ENSURE PLUS) LIQD Take 237 mLs by mouth 2 (two) times daily.   Yes Historical Provider, MD  esomeprazole (Tynan)  20 MG capsule Take 20 mg by mouth every morning.   Yes Historical Provider, MD  ferrous sulfate 325 (65 FE) MG tablet Take 1 tablet (325 mg total) by mouth 2 (two) times daily with a meal. 08/22/14  Yes Debbe Odea, MD  FLUoxetine (PROZAC) 40 MG capsule Take 1 capsule (40 mg total) by mouth 2 (two) times daily. 08/22/14  Yes Debbe Odea, MD  irbesartan (AVAPRO) 75 MG tablet Take 1 tablet (75 mg total) by mouth daily. 08/22/14  Yes Debbe Odea, MD  OxyCODONE 30 MG T12A Take 30 mg by mouth every 8 (eight) hours. 08/22/14  Yes Debbe Odea, MD  Oxycodone HCl 10 MG TABS Take 1 tablet (10 mg total) by mouth every 4 (four) hours as needed. 08/28/14  Yes Curt Bears, MD  simvastatin (ZOCOR) 40 MG tablet Take 40 mg by mouth at bedtime.    Yes Historical Provider, MD  tiotropium (SPIRIVA) 18 MCG inhalation capsule Place 18 mcg into inhaler and inhale daily.   Yes Historical Provider, MD  zolpidem (AMBIEN) 10 MG tablet Take 0.5-1 tablets (5-10 mg total) by mouth at bedtime as needed for sleep. 08/22/14  Yes Debbe Odea, MD  dexamethasone (DECADRON) 4 MG tablet 4 mg by mouth twice a day the day before, day of and day after the chemotherapy every 3 weeks. Patient not taking: Reported on 09/06/2014 09/01/14   Curt Bears, MD  docusate sodium 100 MG CAPS Take 200 mg by mouth 2 (two) times daily. Patient not taking: Reported on 09/06/2014 08/22/14   Debbe Odea, MD  folic acid (FOLVITE) 1 MG tablet Take 1 tablet (1 mg total) by mouth daily. Patient not taking: Reported on 09/06/2014 09/01/14   Curt Bears, MD  ondansetron (ZOFRAN-ODT) 4 MG disintegrating tablet Take 1 tablet (4 mg total) by mouth every 8 (eight) hours as needed for nausea or vomiting. Patient not taking: Reported on 09/06/2014 08/22/14   Debbe Odea, MD  polyethylene glycol (MIRALAX / GLYCOLAX) packet Take 17 g by mouth daily as needed for mild constipation (give in AM PRN). Patient not taking: Reported on 09/06/2014 08/22/14   Debbe Odea, MD  prochlorperazine (COMPAZINE) 10 MG tablet Take 1 tablet (10 mg total) by mouth every 6 (six) hours as needed for nausea or vomiting. Patient not taking: Reported on 09/06/2014 09/01/14   Curt Bears, MD  senna (SENOKOT) 8.6 MG TABS tablet Take 1 tablet (8.6 mg total) by mouth at bedtime as needed for mild constipation. Patient not taking: Reported on 09/06/2014 08/22/14   Debbe Odea, MD     Allergies:  No Known Allergies  Social History:   reports that she quit smoking about 6 weeks ago. She started smoking about 37 years ago. She does not have any smokeless tobacco history on file. She reports that she does not drink alcohol or use illicit drugs.  Family History: Family History  Problem Relation Age of Onset  . Emphysema Father   . Depression Mother   . Glaucoma Mother   . Thyroid disease Mother   . CVA Mother   . Breast cancer Mother   . Colon cancer Maternal Grandmother   . CVA Brother 95    1/3  . Thyroid disease Sister     1/2, partial thyroidectomy  . Goiter Sister     2/2     Physical Exam: Filed Vitals:   09/18/14 1353 09/18/14 2129 09/19/14 0604 09/19/14 0944  BP: 108/56 125/66 137/55   Pulse: 76 76 79  Temp: 97.6 F (36.4 C) 97.5 F (36.4 C) 98.2 F (36.8 C)   TempSrc: Oral Oral Oral   Resp: 18 16 16    Height:      Weight:      SpO2: 100% 100% 100% 97%   Blood pressure 137/55, pulse 79, temperature 98.2 F (36.8 C), temperature source Oral, resp. rate 16, height 5\' 5"  (1.651 m), weight 88.3 kg (194 lb 10.7 oz), SpO2 97 %.  GEN:  Pleasant patient lying in the stretcher in no acute distress; cooperative with exam. PSYCH:  alert and oriented x4; does not appear anxious or depressed; affect is appropriate. HEENT: Mucous membranes pink and anicteric; PERRLA; EOM intact; no cervical lymphadenopathy nor thyromegaly or carotid bruit; no JVD; There were no stridor. Neck is very supple. Breasts:: Not examined CHEST WALL: No tenderness CHEST: Normal respiration, clear to auscultation bilaterally.  HEART: Regular rate and rhythm.  There are no murmur, rub, or gallops.   BACK: She is in a brace. ABDOMEN: soft and non-tender; no masses, no  organomegaly, normal abdominal bowel sounds; no pannus; no intertriginous candida. There is no rebound and no distention. Rectal Exam: Not done EXTREMITIES: No bone or joint deformity; age-appropriate arthropathy of the hands and knees; no edema; no ulcerations.  There is no calf tenderness. Genitalia: not examined PULSES: 2+ and symmetric SKIN: Normal hydration no rash or ulceration CNS: Cranial nerves 2-12 grossly intact no focal lateralizing neurologic deficit.  Speech is fluent; uvula elevated with phonation, facial symmetry and tongue midline. DTR are normal bilaterally, cerebella exam is intact, barbinski is negative and strengths are equaled bilaterally.  No sensory loss.   Labs on Admission:  Basic Metabolic Panel:  Recent Labs Lab 09/13/14 0415 09/14/14 0359 09/15/14 0400  NA 135 130* 133*  K 4.3 3.9 4.4  CL 88* 90* 96  CO2 40* 32 26  GLUCOSE 158* 150* 170*  BUN 27* 29* 40*  CREATININE 0.63 0.76 0.84  CALCIUM 8.6 8.7 9.0  MG 2.1 2.2 2.6*  PHOS 4.0 4.1 5.1*   Liver Function Tests: CBC:  Recent Labs Lab 09/13/14 0415 09/14/14 0359 09/15/14 0400  WBC 13.9* 17.2* 17.7*  HGB 10.3* 11.4* 11.6*  HCT 32.9* 34.6* 36.9  MCV 86.8 85.0 86.6  PLT 313 286 281   Cardiac Enzymes: No results for input(s): CKTOTAL, CKMB, CKMBINDEX, TROPONINI in the last 168 hours.  CBG:  Recent Labs Lab 09/18/14 1120 09/18/14 1648 09/18/14 2133 09/19/14 0722 09/19/14 1131  GLUCAP 101* 156* 134* 86 117*   Assessment/Plan Present on Admission:  . HCAP (healthcare-associated pneumonia) . Acute respiratory failure with hypoxia . Hypotension . (Resolved) Sepsis . Non-small cell lung cancer . Severe sepsis  PLAN: See previous notes.  She is now better after Tx of her sepsis and HCAP.  She still require high oxygen flow.  Hospice is being sought and is engaging.  Family will decide if they would like to participate in hospice.  She is now a DNR.  Anemia:  Stable.  Mild  leukocytosis: from infection.  Now steroids.  NSCLC:  S/p palliative Rx, chemo now on hold.  Seen by Palliative care, now being seen by hospice RN.   Other plans as per orders.  Code Status: FULL Haskel Khan, MD. [facility] Pager [phone] 7pm to 7am.  09/19/2014, 12:34 PM

## 2014-09-19 NOTE — Progress Notes (Signed)
Physical Therapy Treatment Patient Details Name: Jyllian Haynie [facility] MRN: [id number] DOB: November 26, 1950 Today's Date: 09/19/2014    History of Present Illness 64 yo female admitted with acute hypoxic resp failure, sepsis. 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     PT Comments    Spouse present during session and very helpful.  Assisted pt OOB to amb limited distance.  Pt remained on 6 lts O2 lowest sat was 86 during gait.  Limited activity tolerance.  Follow Up Recommendations   [facility])     Equipment Recommendations  Rolling walker with 5" wheels;Hospital bed;3in1 (PT);Wheelchair (measurements PT);Wheelchair cushion (measurements PT)    Recommendations for Other Services       Precautions / Restrictions Precautions Precautions: Back;Fall Precaution Comments: back precautions for safety; logroll; monitor O2 sats Required Braces or Orthoses: Spinal Brace Spinal Brace: Thoracolumbosacral orthotic;Applied in sitting position Restrictions Weight Bearing Restrictions: No    Mobility  Bed Mobility Overal bed mobility: Needs Assistance Bed Mobility: Rolling;Sidelying to Sit   Sidelying to sit: Min assist       General bed mobility comments: Assist for trunk and LEs. Increased time. VCS safety, technique. Repeat functional cueing/pt easily distracted.  Transfers Overall transfer level: Needs assistance Equipment used: Rolling walker (2 wheeled) Transfers: Sit to/from Stand           General transfer comment: 50% VC's on proper tech and hand placement plus increased time  Ambulation/Gait Ambulation/Gait assistance: +2 safety/equipment;Mod assist Ambulation Distance (Feet): 8 Feet Assistive device: Rolling walker (2 wheeled) Gait Pattern/deviations: Step-to pattern;Decreased step length - right;Decreased step length - left;Trunk flexed Gait velocity: decreased   General Gait Details: limited activity tolerance due to extended days  in bed.   Stairs            Wheelchair Mobility    Modified Rankin (Stroke Patients Only)       Balance                                    Cognition                            Exercises      General Comments        Pertinent Vitals/Pain Pain Assessment: No/denies pain    Home Living                      Prior Function            PT Goals (current goals can now be found in the care plan section) Progress towards PT goals: Progressing toward goals    Frequency  Min 3X/week    PT Plan      Co-evaluation             End of Session Equipment Utilized During Treatment: Gait belt;Oxygen Activity Tolerance: Patient limited by fatigue Patient left: in chair;with call bell/phone within reach;with family/visitor present     Time: 4627-0350 PT Time Calculation (min) (ACUTE ONLY): 29 min  Charges:  $Gait Training: 8-22 mins $Therapeutic Activity: 8-22 mins                    G Codes:      Rica Koyanagi  PTA [facility]  Acute  Rehab Pager      [phone]

## 2014-09-20 ENCOUNTER — Telehealth: Payer: Self-pay | Admitting: *Deleted

## 2014-09-20 DIAGNOSIS — C3490 Malignant neoplasm of unspecified part of unspecified bronchus or lung: Secondary | ICD-10-CM

## 2014-09-20 LAB — CREATININE, SERUM
Creatinine, Ser: 0.46 mg/dL — ABNORMAL LOW (ref 0.50–1.10)
GFR calc Af Amer: >90 mL/min (ref >90)

## 2014-09-20 LAB — GLUCOSE, CAPILLARY
GLUCOSE-CAPILLARY: 153 mg/dL — AB (ref 70–99)
GLUCOSE-CAPILLARY: 175 mg/dL — AB (ref 70–99)
Glucose-Capillary: 92 mg/dL (ref 70–99)
Glucose-Capillary: 135 mg/dL — ABNORMAL HIGH (ref 70–99)

## 2014-09-20 NOTE — Progress Notes (Signed)
[facility] PROGRESS NOTE  Emily West [facility] QVZ:[id number] DOB: May 17, 1951    PCP:   Emily Pound, MD   PIR:JJOACZY Emily West is an 64 y.o. female with h/o recent diagnosis of stage 4 NSCLC , admitted for acute hypoxic respiratory failure presumed to be infectious. She was started on high dose steroids and IV antibiotics and had ongoing high oxygen requirements since admission. PCCM and oncology consulted. Patient's husband requested palliative care meeting for goals of care, and she is now a DO NOT RESUSCITATE. Barrier to discharge is ongoing high oxygen requirement/activity intolerance. She is reluctant to go home and at this time, does not want to consider a rehabilitation facility. Hospice was consulted after hospice spoke with family, they would like to go home when medically stable with HHS and pallitative care.  According to the oncologist's note, chemotherapy is still being offered if she improves. It appears that family is still interested in further treatment if there is any need for it. Setting up at home were done today, but it is now late and she would like to be discharged tomorrow am.  Rewiew of Systems:  Constitutional: Negative for malaise, fever and chills. No significant weight loss or weight gain Eyes: Negative for eye pain, redness and discharge, diplopia, visual changes, or flashes of light. ENMT: Negative for ear pain, hoarseness, nasal congestion, sinus pressure and sore throat. No headaches; tinnitus, drooling, or problem swallowing. Cardiovascular: Negative for chest pain, palpitations, diaphoresis, dyspnea and peripheral edema. ; No orthopnea, PND Respiratory: Negative for cough, hemoptysis, wheezing and stridor. No pleuritic chestpain. Gastrointestinal: Negative for nausea, vomiting, diarrhea, constipation, abdominal pain, melena, blood in stool, hematemesis, jaundice and rectal bleeding.    Genitourinary: Negative for frequency, dysuria, incontinence,flank pain  and hematuria; Musculoskeletal: Negative for back pain and neck pain. Negative for swelling and trauma.;  Skin: . Negative for pruritus, rash, abrasions, bruising and skin lesion.; ulcerations Neuro: Negative for headache, lightheadedness and neck stiffness. Negative for weakness, altered level of consciousness , altered mental status, extremity weakness, burning feet, involuntary movement, seizure and syncope.  Psych: negative for anxiety, depression, insomnia, tearfulness, panic attacks, hallucinations, paranoia, suicidal or homicidal ideation    Past Medical History  Diagnosis Date  . History of suicidal ideation   . HTN (hypertension)   . HLD (hyperlipidemia)   . Shortness of breath   . Anxiety and depression   . Menopause   . Benign hematuria   . Impaired fasting glucose   . Other abnormal glucose   . Cancer     Past Surgical History  Procedure Laterality Date  . Colonoscopy  11/21/2003     rare early left-sided diverticula remaining, low sigmoid anastomosis, internal and external hemorrhoids,   . Cystoscopy    . Lymph node biopsy Right 08/09/2014    Procedure: Right axillary lymph node biopsy;  Surgeon: Alphonsa Overall, MD;  Location: [facility];  Service: General;  Laterality: Right;    Medications:  HOME MEDS: Prior to Admission medications   Medication Sig Start Date End Date Taking? Authorizing Provider  ALPRAZolam Duanne Moron) 0.5 MG tablet Take 1 tablet (0.5 mg total) by mouth 3 (three) times daily as needed for anxiety. 09/04/14  Yes Curt Bears, MD  ARIPiprazole (ABILIFY) 2 MG tablet Take 2 mg by mouth daily.   Yes Historical Provider, MD  cyclobenzaprine (FLEXERIL) 10 MG tablet Take 1 tablet (10 mg total) by mouth at bedtime. 08/22/14  Yes Debbe Odea, MD  ENSURE PLUS (ENSURE PLUS) LIQD Take 237  mLs by mouth 2 (two) times daily.   Yes Historical Provider, MD  esomeprazole (NEXIUM) 20 MG capsule Take 20 mg by mouth every morning.   Yes Historical Provider, MD  ferrous  sulfate 325 (65 FE) MG tablet Take 1 tablet (325 mg total) by mouth 2 (two) times daily with a meal. 08/22/14  Yes Debbe Odea, MD  FLUoxetine (PROZAC) 40 MG capsule Take 1 capsule (40 mg total) by mouth 2 (two) times daily. 08/22/14  Yes Debbe Odea, MD  irbesartan (AVAPRO) 75 MG tablet Take 1 tablet (75 mg total) by mouth daily. 08/22/14  Yes Debbe Odea, MD  OxyCODONE 30 MG T12A Take 30 mg by mouth every 8 (eight) hours. 08/22/14  Yes Debbe Odea, MD  Oxycodone HCl 10 MG TABS Take 1 tablet (10 mg total) by mouth every 4 (four) hours as needed. 08/28/14  Yes Curt Bears, MD  simvastatin (ZOCOR) 40 MG tablet Take 40 mg by mouth at bedtime.    Yes Historical Provider, MD  tiotropium (SPIRIVA) 18 MCG inhalation capsule Place 18 mcg into inhaler and inhale daily.   Yes Historical Provider, MD  zolpidem (AMBIEN) 10 MG tablet Take 0.5-1 tablets (5-10 mg total) by mouth at bedtime as needed for sleep. 08/22/14  Yes Debbe Odea, MD  dexamethasone (DECADRON) 4 MG tablet 4 mg by mouth twice a day the day before, day of and day after the chemotherapy every 3 weeks. Patient not taking: Reported on 09/06/2014 09/01/14   Curt Bears, MD  docusate sodium 100 MG CAPS Take 200 mg by mouth 2 (two) times daily. Patient not taking: Reported on 09/06/2014 08/22/14   Debbe Odea, MD  folic acid (FOLVITE) 1 MG tablet Take 1 tablet (1 mg total) by mouth daily. Patient not taking: Reported on 09/06/2014 09/01/14   Curt Bears, MD  ondansetron (ZOFRAN-ODT) 4 MG disintegrating tablet Take 1 tablet (4 mg total) by mouth every 8 (eight) hours as needed for nausea or vomiting. Patient not taking: Reported on 09/06/2014 08/22/14   Debbe Odea, MD  polyethylene glycol (MIRALAX / GLYCOLAX) packet Take 17 g by mouth daily as needed for mild constipation (give in AM PRN). Patient not taking: Reported on 09/06/2014 08/22/14   Debbe Odea, MD  prochlorperazine (COMPAZINE) 10 MG tablet Take 1 tablet (10 mg total) by mouth  every 6 (six) hours as needed for nausea or vomiting. Patient not taking: Reported on 09/06/2014 09/01/14   Curt Bears, MD  senna (SENOKOT) 8.6 MG TABS tablet Take 1 tablet (8.6 mg total) by mouth at bedtime as needed for mild constipation. Patient not taking: Reported on 09/06/2014 08/22/14   Debbe Odea, MD     Allergies:  No Known Allergies  Social History:   reports that she quit smoking about 6 weeks ago. She started smoking about 37 years ago. She does not have any smokeless tobacco history on file. She reports that she does not drink alcohol or use illicit drugs.  Family History: Family History  Problem Relation Age of Onset  . Emphysema Father   . Depression Mother   . Glaucoma Mother   . Thyroid disease Mother   . CVA Mother   . Breast cancer Mother   . Colon cancer Maternal Grandmother   . CVA Brother 59    1/3  . Thyroid disease Sister     1/2, partial thyroidectomy  . Goiter Sister     2/2     Physical Exam: Filed Vitals:   09/19/14 1458 09/19/14  2113 09/20/14 0618 09/20/14 1400  BP: 129/56 116/57 127/54 125/68  Pulse: 72 73 74 95  Temp: 97.9 F (36.6 C) 97.2 F (36.2 C) 97.6 F (36.4 C) 97.9 F (36.6 C)  TempSrc: Oral Oral Oral Axillary  Resp: 18 20 20 19   Height:      Weight:      SpO2: 99% 95% 100% 100%   Blood pressure 125/68, pulse 95, temperature 97.9 F (36.6 C), temperature source Axillary, resp. rate 19, height 5\' 5"  (1.651 m), weight 88.3 kg (194 lb 10.7 oz), SpO2 100 %.  GEN:  Pleasant patient lying in the stretcher in no acute distress; cooperative with exam. PSYCH:  alert and oriented x4; does not appear anxious or depressed; affect is appropriate. HEENT: Mucous membranes pink and anicteric; PERRLA; EOM intact; no cervical lymphadenopathy nor thyromegaly or carotid bruit; no JVD; There were no stridor. Neck is very supple. Breasts:: Not examined CHEST WALL: No tenderness CHEST: Normal respiration, clear to auscultation bilaterally.   HEART: Regular rate and rhythm.  There are no murmur, rub, or gallops.   BACK: No kyphosis or scoliosis; no CVA tenderness ABDOMEN: soft and non-tender; no masses, no organomegaly, normal abdominal bowel sounds; no pannus; no intertriginous candida. There is no rebound and no distention. Rectal Exam: Not done EXTREMITIES: No bone or joint deformity; age-appropriate arthropathy of the hands and knees; no edema; no ulcerations.  There is no calf tenderness. Genitalia: not examined PULSES: 2+ and symmetric SKIN: Normal hydration no rash or ulceration CNS: Cranial nerves 2-12 grossly intact no focal lateralizing neurologic deficit.  Speech is fluent; uvula elevated with phonation, facial symmetry and tongue midline. DTR are normal bilaterally, cerebella exam is intact, barbinski is negative and strengths are equaled bilaterally.  No sensory loss.   Labs on Admission:  Basic Metabolic Panel:  Recent Labs Lab 09/14/14 0359 09/15/14 0400 09/20/14 0432  NA 130* 133*  --   K 3.9 4.4  --   CL 90* 96  --   CO2 32 26  --   GLUCOSE 150* 170*  --   BUN 29* 40*  --   CREATININE 0.76 0.84 0.46*  CALCIUM 8.7 9.0  --   MG 2.2 2.6*  --   PHOS 4.1 5.1*  --    Liver Function Tests: No results for input(s): AST, ALT, ALKPHOS, BILITOT, PROT, ALBUMIN in the last 168 hours. No results for input(s): LIPASE, AMYLASE in the last 168 hours. No results for input(s): AMMONIA in the last 168 hours. CBC:  Recent Labs Lab 09/14/14 0359 09/15/14 0400  WBC 17.2* 17.7*  HGB 11.4* 11.6*  HCT 34.6* 36.9  MCV 85.0 86.6  PLT 286 281   Cardiac Enzymes: No results for input(s): CKTOTAL, CKMB, CKMBINDEX, TROPONINI in the last 168 hours.  CBG:  Recent Labs Lab 09/19/14 1131 09/19/14 1728 09/19/14 2119 09/20/14 0745 09/20/14 1237  GLUCAP 117* 190* 163* 92 135*   Assessment/Plan Present on Admission:  . HCAP (healthcare-associated pneumonia) . Acute respiratory failure with hypoxia . Hypotension .  (Resolved) Sepsis . Non-small cell lung cancer . Severe sepsis  PLAN: She is now better after Tx of her sepsis and HCAP. She still require high oxygen flow. After speaking with hospice, family decided against it and would like to be discharged to home tomorrow with HHS. She is now a DNR.  Anemia: Stable.  Mild leukocytosis: from infection. Now steroids.  NSCLC: S/p palliative Rx, chemo now on hold. Seen by Palliative care, and would  like to go home in the morning with HHS.  Other plans as per orders.  Code Status: Oda Kilts, MD. [facility] Pager [phone] 7pm to 7am.  09/20/2014, 3:58 PM

## 2014-09-20 NOTE — Telephone Encounter (Signed)
Pt's husband called stating that pt will be d/c from the hospital and he wanted to know what her cancer status is.  Per Dr Vista Mink, she can have a f/u appt scheduled in a couple weeks to determine whether chemotherapy would be appropriate for her.  He verbalized understanding.  Onc tx schedule sent to schedulers

## 2014-09-20 NOTE — Progress Notes (Signed)
CARE MANAGEMENT NOTE 09/20/2014  Patient:  Emily West   Account Number:  [id number]  Date Initiated:  09/13/2014  Documentation initiated by:  [facility]  Subjective/Objective Assessment:   adm: SOB/recent dx stage IV NSCLC admitted 12/23 for hypoxic respiratory failure     Action/Plan:   discharge planning   Anticipated DC Date:  09/14/2014   Anticipated DC Plan:        [place]  CM consult      Choice offered to / List presented to:  C-1 Patient   DME arranged  [place]      DME agency  [facility]   Status of service:  In process, will continue to follow Medicare Important Message given?   (If response is "NO", the following Medicare IM given date fields will be blank) Date Medicare IM given:   Medicare IM given by:   Date Additional Medicare IM given:   Additional Medicare IM given by:    Discharge Disposition:  [place]  Per UR Regulation:  Reviewed for med. necessity/level of care/duration of stay  If discussed at [place] of Stay Meetings, dates discussed:   09/19/2014    Comments:  09/20/14 [place] [phone] Spoke with patient spouse Emily West and he stated that the wheelchair will be delivered to today between 1 and 2. He has hired caregivers through [employer] for 8 hrs/day, 40 hrs/week, and has already spoken with [employer], Tim, regarding initiation of [place] services and support in the home. Confirmed HH services with [facility] liaison, and awaiting [place] orders. Patient will be transferred home via [place].  09/19/14 [place] [phone] Patient and family have decided to go home with [facility] services and resume care with [facility]. Communicated possible referral to [facility] liaison to confirm follow up care in the home with home O2 at 8% on oximizer. Received confirmation the [facility] will resume care upon discharge. L/m with [facility]  DME rep regarding DME orders, that family only needs wheelchair and would like it to be delivered to the home. Will continue to follow  09/19/14 Edwyna Shell RN BSN CM [phone] Attended [place] meeting with Palliative NP in which spouse and patient decided to go home with [facility] and [place]. Palliative NP stated that choice had previously been discussed the patient and spouse and have already chosen [facility]. Palliative NP discussed the need for caregivers in the home to support the spouse so this CM provided a Private duty sitter agency list and encouraged them to set up caregivers for prior to discharge. Spouse stated that they only need a wheelchair to be delivered to the house and that [facility] services would have to be cancelled. This CM communicated referral for hospice to [facility], [facility] liaison.  09/18/14 Edwyna Shell RN BSN CM [phone] transfer from ICU to [place] 09/17/14, [place] meeting shceduled with patient and family for 09/19/14, will continue to follow  09/13/14 CM HAS RECEIVED CALL FROM PALLIATIVE AND PT IS NOT BEEN REFERRED FOR HOSPICE AT [place]; goc HAVE NOT BEEN ESTABLISHED AND ORDERS HAVE NOT BEEN PLACED FOR PALLIATIVE; CALL PLACED TO [name] to apologize for misinterpretation.  Cm will continue to follow for disposition.  Emily West, BSN, [place]. 09/13/14 10:00 CM met with pt and pt's daughter in room to  discuss oximyzers (from [facility]).  Pt's husband, Emily West,  had stepped out and can be reached at [phone]. Disposition was discussed and pt and daughter informed CM of choice to go home with hospice.  Family had stated they had a meeting with Emily West.  Pt chooses Hospice and [place].  Address and contact information verified by pt.  Pt is oxygen dependent and CM received call for oxymizers to be brought to room from [facility].  CM brought both styles to room and RN called Respiratory to place.  CM called Referral to [place] and spoke with  [name] who states she will relay information to [place].  No other CM needs were communicated.  Emily West, BSN, CM [phone].

## 2014-09-20 NOTE — Progress Notes (Signed)
Physical Therapy Treatment Patient Details Name: Emily West MRN: [id number] DOB: February 26, 1951 Today's Date: 09/20/2014    History of Present Illness 64 yo female admitted with acute hypoxic resp failure, sepsis. 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     PT Comments    Assisted pt OOB to amb 2 short distances.  Still requires + 2 assist for safety such that recliner chair can follow as B LE tend to buckle with fatigue.    Follow Up Recommendations   (hospice)     Equipment Recommendations  Rolling walker with 5" wheels;Hospital bed;3in1 (PT);Wheelchair (measurements PT);Wheelchair cushion (measurements PT)    Recommendations for Other Services       Precautions / Restrictions Precautions Precautions: Back;Fall Precaution Comments: back precautions for safety; logroll; monitor O2 sats Required Braces or Orthoses: Spinal Brace Spinal Brace: Thoracolumbosacral orthotic;Applied in sitting position Restrictions Weight Bearing Restrictions: No    Mobility  Bed Mobility Overal bed mobility: Needs Assistance Bed Mobility: Rolling;Sidelying to Sit Rolling: Min guard   Supine to sit: Min assist;Mod assist     General bed mobility comments: Assist for trunk and LEs. Increased time. VCS safety, technique. Repeat functional cueing/pt easily distracted.  Transfers Overall transfer level: Needs assistance Equipment used: Rolling walker (2 wheeled) Transfers: Sit to/from Stand Sit to Stand: +2 safety/equipment;Min assist;Mod assist         General transfer comment: 50% VC's on proper tech and hand placement plus increased time.  Pt admits to being nervous when first getting up.  fear of falling.  Ambulation/Gait Ambulation/Gait assistance: +2 safety/equipment;Min assist;Mod assist Ambulation Distance (Feet): 22 Feet (11 feet x 2 one sitting rest break) Assistive device: Rolling walker (2 wheeled) Gait Pattern/deviations: Step-to  pattern;Decreased step length - right;Decreased step length - left;Trunk flexed Gait velocity: decreased   General Gait Details: limited activity tolerance due to extended days in bed.  Remained on 6 lts oximyzer sats started at 100% then decreased to 83% with amb.     Stairs Stairs:  (come via ambulance)          Wheelchair Mobility    Modified Rankin (Stroke Patients Only)       Balance                                    Cognition                            Exercises      General Comments        Pertinent Vitals/Pain Pain Assessment: Faces Faces Pain Scale: Hurts a little bit Pain Location: R upper thigh Pain Descriptors / Indicators: Discomfort Pain Intervention(s): Monitored during session;Repositioned    Home Living                      Prior Function            PT Goals (current goals can now be found in the care plan section) Progress towards PT goals: Progressing toward goals    Frequency  Min 3X/week    PT Plan      Co-evaluation             End of Session Equipment Utilized During Treatment: Gait belt;Oxygen Activity Tolerance: Patient limited by fatigue;Treatment limited secondary to medical complications (Comment) Patient left: in  chair;with call bell/phone within reach;with family/visitor present     Time: 1021-1050 PT Time Calculation (min) (ACUTE ONLY): 29 min  Charges:  $Gait Training: 8-22 mins $Therapeutic Activity: 8-22 mins                    G Codes:      Rica Koyanagi  PTA [facility]  Acute  Rehab Pager      [phone]

## 2014-09-21 ENCOUNTER — Telehealth: Payer: Self-pay | Admitting: Internal Medicine

## 2014-09-21 LAB — GLUCOSE, CAPILLARY: GLUCOSE-CAPILLARY: 110 mg/dL — AB (ref 70–99)

## 2014-09-21 MED ORDER — ROSUVASTATIN CALCIUM 10 MG PO TABS: 10.0000 mg | ORAL_TABLET | Freq: Every day | ORAL | Status: AC

## 2014-09-21 MED ORDER — PREDNISONE 10 MG PO TABS: 30.0000 mg | ORAL_TABLET | Freq: Every day | ORAL | Status: AC

## 2014-09-21 MED ORDER — POLYETHYLENE GLYCOL 3350 17 G PO PACK: 17.0000 g | PACK | Freq: Every day | ORAL | Status: AC

## 2014-09-21 MED ORDER — HEPARIN SOD (PORK) LOCK FLUSH 100 UNIT/ML IV SOLN
250.0000 [IU] | INTRAVENOUS | Status: AC | PRN
  Administered 2014-09-21: 250 [IU]

## 2014-09-21 MED ORDER — FLUOXETINE HCL 40 MG PO CAPS: 40.0000 mg | ORAL_CAPSULE | Freq: Two times a day (BID) | ORAL | Status: AC

## 2014-09-21 MED ORDER — TIOTROPIUM BROMIDE MONOHYDRATE 18 MCG IN CAPS: 18.0000 ug | ORAL_CAPSULE | Freq: Every day | RESPIRATORY_TRACT | Status: AC

## 2014-09-21 MED ORDER — SODIUM CHLORIDE 0.9 % IJ SOLN: 10.0000 mL | Freq: Two times a day (BID) | INTRAMUSCULAR | Status: AC

## 2014-09-21 MED ORDER — OXYCODONE HCL ER 30 MG PO T12A: 30.0000 mg | EXTENDED_RELEASE_TABLET | Freq: Three times a day (TID) | ORAL | Status: AC

## 2014-09-21 MED ORDER — OXYCODONE HCL 5 MG PO TABS: 5.0000 mg | ORAL_TABLET | ORAL | Status: AC | PRN

## 2014-09-21 MED ORDER — CHLORHEXIDINE GLUCONATE 0.12 % MT SOLN: 15.0000 mL | Freq: Two times a day (BID) | OROMUCOSAL | Status: AC

## 2014-09-21 MED ORDER — IRBESARTAN 75 MG PO TABS: 75.0000 mg | ORAL_TABLET | Freq: Every day | ORAL | Status: AC

## 2014-09-21 MED ORDER — CETYLPYRIDINIUM CHLORIDE 0.05 % MT LIQD: 7.0000 mL | Freq: Two times a day (BID) | OROMUCOSAL | Status: AC

## 2014-09-21 MED ORDER — ESOMEPRAZOLE MAGNESIUM 20 MG PO CPDR: 20.0000 mg | DELAYED_RELEASE_CAPSULE | Freq: Every morning | ORAL | Status: AC

## 2014-09-21 NOTE — Plan of Care (Signed)
Problem: Discharge Progression Outcomes Goal: Dyspnea controlled Outcome: Adequate for Discharge Pt going home on oxygen Goal: O2 sats > or equal 90% or at baseline Outcome: Adequate for Discharge Pt going home on oxygen

## 2014-09-21 NOTE — Progress Notes (Addendum)
Clinical Social Work Department BRIEF PSYCHOSOCIAL ASSESSMENT 09/21/2014  Patient:  Emily West,Emily West     Account Number:  [id number]     Admit date:  09/06/2014  Clinical Social Worker:  Earlie Server  Date/Time:  09/21/2014 11:30 AM  Referred by:  Physician  Date Referred:  09/21/2014 Referred for  Transportation assistance   Other Referral:   Interview type:  Family Other interview type:   Husband-Emily West    PSYCHOSOCIAL DATA Living Status:  FAMILY Admitted from facility:   Level of care:   Primary support name:  Emily West Primary support relationship to patient:  SPOUSE Degree of support available:   Strong    CURRENT CONCERNS Current Concerns  Other - See comment   Other Concerns:   Transportation    SOCIAL WORK ASSESSMENT / PLAN CSW received referral in order to assist with transportation home. CSW spoke with CM who reports HH has been arranged and RN reports patient can be picked up at [facility] went to room to meet with patient to verify DC plans but patient sleeping. CSW called and spoke with husband via phone. Husband reports that patient has received all equipment and he has been in touch with [facility] agency already. Husband reports he will have everything ready and be at home to accept patient.    PTAR forms completed and placed on chart with DNR form. PTAR #[id number]. CSW is signing off but available if needed.   Assessment/plan status:  No Further Intervention Required Other assessment/ plan:   Information/referral to community resources:   PTAR home    PATIENT'S/FAMILY'S RESPONSE TO PLAN OF CARE: Patient sleeping so husband completed assessment. Husband thanked CSW for time and verified address. Husband reports he is trying to be as supportive as possible for patient and hopes everything is in order for when she arrives home. Husband agreeable to DC plans and reports no further needs.       Emily West, Emily West [phone]  [id number] CM reports changes need  to be made to [facility] orders. PTAR called and rescheduled for 3pm pick up. RN aware.
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
## 2014-09-21 NOTE — Telephone Encounter (Signed)
lvm for pt regarding to Jan appt....mailed pt appt sched and letter

## 2014-09-21 NOTE — Progress Notes (Signed)
CARE MANAGEMENT NOTE 09/21/2014  Patient:  Emily West,Emily West   Account Number:  [id number]  Date Initiated:  09/13/2014  Documentation initiated by:  [facility]  Subjective/Objective Assessment:   adm: SOB/recent dx stage IV NSCLC admitted 12/23 for hypoxic respiratory failure     Action/Plan:   discharge planning   Anticipated DC Date:  09/14/2014   Anticipated DC Plan:        [place]  CM consult      Choice offered to / List presented to:  C-1 Patient   DME arranged  [place]      DME agency  [place] arranged  HH-1 RN  HH-10 DISEASE MANAGEMENT  HH-2 PT  HH-3 OT  [place]   Status of service:  Completed, signed off Medicare Important Message given?   (If response is "NO", the following Medicare IM given date fields will be blank) Date Medicare IM given:   Medicare IM given by:   Date Additional Medicare IM given:   Additional Medicare IM given by:    Discharge Disposition:  [place]  Per UR Regulation:  Reviewed for med. necessity/level of care/duration of stay  If discussed at [place] of Stay Meetings, dates discussed:   09/19/2014  09/21/2014    Comments:  09/21/14 Edwyna Shell RN BSN CM [phone] Patient will go home with [facility] and PICC line with maintenance care per MD orders. Albertha Ghee, amde aware and stated that they will provide the PICC line care but will need the flushes ordered through a separate pharmacy as they do not have an infusion department. Spoke with Jerene Pitch at [facility] and she stated that they can order 10 cc flushes once prescription faxed. Updated attending MD on need for specific flush instructions with prescription. Faxed prescription to [facility] and confirmed with [employer]. Patient and spouse, Emily West, aware that they will need to pick up flushes at [facility], [facility], and  provided contact infomration. Advised to call prior to pick up and explained to make [facility] RN visit after flushes are picked up for follow up teaching and care in the home. Patient and spouse verbalized understanding and had no questions or concerns. PTAR called by CSW for transportation home.  09/20/14 Edwyna Shell RN BSn CM [phone] Spoke with patient spouse Emily West and he stated that the wheelchair will be delivered to today between 1 and 2. He has hired caregivers through [employer] for 8 hrs/day, 40 hrs/week, and has already spoken with [employer], Tim, regarding initiation of [place] services and support in the home. Confirmed HH services with [facility] liaison, and awaiting [place] orders. Patient will be transferred home via [place].  09/19/14 [place] [phone] Patient and family have decided to go home with [facility] services and resume care with [facility]. Communicated possible referral to [facility] liaison to confirm follow up care in the home with home O2 at 8% on oximizer. Received confirmation the [facility] will resume care upon discharge. L/m with [facility] DME rep regarding DME orders, that family only needs wheelchair and would like it to be delivered to the home. Will continue to follow  09/19/14 Edwyna Shell RN BSN CM [phone] Attended [place] meeting with Palliative NP in which spouse and patient decided to go home with  [facility] [place]. Palliative NP stated that choice had previously been discussed the patient and spouse and have already chosen [facility]. Palliative NP discussed the need for caregivers in the home to support the spouse so this CM provided a Private duty sitter agency list and encouraged them to set up caregivers for prior to discharge. Spouse stated that they only need a wheelchair to be delivered to the house and that [facility] services would have to be cancelled. This CM communicated referral for hospice to [facility], [facility] liaison.  09/18/14 Edwyna Shell RN BSN CM [phone] transfer from ICU to [place] 09/17/14, [place] meeting shceduled with patient and family for 09/19/14, will continue to follow  09/13/14 CM HAS RECEIVED CALL FROM PALLIATIVE AND PT IS NOT BEEN REFERRED FOR HOSPICE AT [place]; goc HAVE NOT BEEN ESTABLISHED AND ORDERS HAVE NOT BEEN PLACED FOR PALLIATIVE; CALL PLACED TO [name] to apologize for misinterpretation.  Cm will continue to follow for disposition.  [name], BSN, [place]. 09/13/14 10:00 CM met with pt and pt's daughter in room to discuss oximyzers (from [facility]).  Pt's husband, Emily West,  had stepped out and can be reached at [phone]. Disposition was discussed and pt and daughter informed CM of choice to go home with hospice.  Family had stated they had a meeting with [name].  Pt chooses [facility] [place].  Address and contact information verified by pt.  Pt is oxygen dependent and CM received call for oxymizers to be brought to room from [facility].  CM brought both styles to room and RN called Respiratory to place.  CM called Referral to [place] and spoke with [name] who states she will relay information to [place].  No other CM needs were communicated.  [name], BSN, CM [phone].

## 2014-09-22 LAB — GLUCOSE, CAPILLARY: Glucose-Capillary: 131 mg/dL — ABNORMAL HIGH (ref 70–99)

## 2014-09-27 ENCOUNTER — Telehealth: Payer: Self-pay | Admitting: Medical Oncology

## 2014-09-27 NOTE — Progress Notes (Signed)
Cancelled.  

## 2014-09-27 NOTE — Telephone Encounter (Signed)
Husband called to r/s his wife's radiation f/u appt.  Marland Kitchen He reports that Emily West  is not very mobile yet. She is getting PT now and he will soon be installing a ramp. Note to [facility] and his nurse . I told him to call xrt to r/s appt.

## 2014-09-28 ENCOUNTER — Ambulatory Visit
Admission: RE | Admit: 2014-09-28 | Payer: BC Managed Care – PPO | Source: Ambulatory Visit | Admitting: Radiation Oncology

## 2014-10-04 ENCOUNTER — Other Ambulatory Visit: Payer: Self-pay

## 2014-10-04 ENCOUNTER — Emergency Department (HOSPITAL_COMMUNITY): Payer: BC Managed Care – PPO

## 2014-10-04 ENCOUNTER — Encounter (HOSPITAL_COMMUNITY): Payer: Self-pay

## 2014-10-04 ENCOUNTER — Ambulatory Visit: Payer: Self-pay

## 2014-10-04 ENCOUNTER — Inpatient Hospital Stay (HOSPITAL_COMMUNITY)
Admission: EM | Admit: 2014-10-04 | Discharge: 2014-10-16 | DRG: 208 | Disposition: E | Payer: BC Managed Care – PPO | Attending: Family Medicine | Admitting: Family Medicine

## 2014-10-04 DIAGNOSIS — J449 Chronic obstructive pulmonary disease, unspecified: Secondary | ICD-10-CM | POA: Diagnosis present

## 2014-10-04 DIAGNOSIS — C3490 Malignant neoplasm of unspecified part of unspecified bronchus or lung: Secondary | ICD-10-CM | POA: Diagnosis present

## 2014-10-04 DIAGNOSIS — E785 Hyperlipidemia, unspecified: Secondary | ICD-10-CM | POA: Diagnosis present

## 2014-10-04 DIAGNOSIS — F329 Major depressive disorder, single episode, unspecified: Secondary | ICD-10-CM | POA: Diagnosis present

## 2014-10-04 DIAGNOSIS — Z515 Encounter for palliative care: Secondary | ICD-10-CM

## 2014-10-04 DIAGNOSIS — R0600 Dyspnea, unspecified: Secondary | ICD-10-CM

## 2014-10-04 DIAGNOSIS — J9600 Acute respiratory failure, unspecified whether with hypoxia or hypercapnia: Secondary | ICD-10-CM | POA: Diagnosis present

## 2014-10-04 DIAGNOSIS — R000 Tachycardia, unspecified: Secondary | ICD-10-CM | POA: Diagnosis present

## 2014-10-04 DIAGNOSIS — C799 Secondary malignant neoplasm of unspecified site: Secondary | ICD-10-CM | POA: Diagnosis present

## 2014-10-04 DIAGNOSIS — C801 Malignant (primary) neoplasm, unspecified: Secondary | ICD-10-CM

## 2014-10-04 DIAGNOSIS — F419 Anxiety disorder, unspecified: Secondary | ICD-10-CM | POA: Diagnosis present

## 2014-10-04 DIAGNOSIS — Z87891 Personal history of nicotine dependence: Secondary | ICD-10-CM | POA: Diagnosis not present

## 2014-10-04 DIAGNOSIS — J9601 Acute respiratory failure with hypoxia: Secondary | ICD-10-CM

## 2014-10-04 DIAGNOSIS — Z800 Family history of malignant neoplasm of digestive organs: Secondary | ICD-10-CM

## 2014-10-04 DIAGNOSIS — J9621 Acute and chronic respiratory failure with hypoxia: Secondary | ICD-10-CM | POA: Diagnosis present

## 2014-10-04 DIAGNOSIS — Z79899 Other long term (current) drug therapy: Secondary | ICD-10-CM | POA: Diagnosis not present

## 2014-10-04 DIAGNOSIS — I10 Essential (primary) hypertension: Secondary | ICD-10-CM | POA: Diagnosis present

## 2014-10-04 DIAGNOSIS — Z66 Do not resuscitate: Secondary | ICD-10-CM | POA: Diagnosis present

## 2014-10-04 DIAGNOSIS — J9620 Acute and chronic respiratory failure, unspecified whether with hypoxia or hypercapnia: Secondary | ICD-10-CM

## 2014-10-04 DIAGNOSIS — Z803 Family history of malignant neoplasm of breast: Secondary | ICD-10-CM | POA: Diagnosis not present

## 2014-10-04 HISTORY — DX: Chronic obstructive pulmonary disease, unspecified: J44.9

## 2014-10-04 LAB — BLOOD GAS, ARTERIAL
Acid-base deficit: 5.5 mmol/L — ABNORMAL HIGH (ref 0.0–2.0)
Bicarbonate: 19.8 mEq/L — ABNORMAL LOW (ref 20.0–24.0)
DRAWN BY: 331471
FIO2: 1 %
LHR: 15 {breaths}/min
MECHVT: 440 mL
O2 SAT: 90.3 %
PEEP/CPAP: 5 cmH2O
Patient temperature: 98.6
TCO2: 18.8 mmol/L (ref 0–100)
pCO2 arterial: 40.3 mmHg (ref 35.0–45.0)
pH, Arterial: 7.312 — ABNORMAL LOW (ref 7.350–7.450)
pO2, Arterial: 70.7 mmHg — ABNORMAL LOW (ref 80.0–100.0)

## 2014-10-04 LAB — CBG MONITORING, ED: Glucose-Capillary: 109 mg/dL — ABNORMAL HIGH (ref 70–99)

## 2014-10-04 MED ORDER — SODIUM CHLORIDE 0.9 % IV BOLUS (SEPSIS)
1000.0000 mL | Freq: Once | INTRAVENOUS | Status: AC
  Administered 2014-10-04: 1000 mL via INTRAVENOUS

## 2014-10-04 MED ORDER — MORPHINE SULFATE 2 MG/ML IJ SOLN
2.0000 mg | INTRAMUSCULAR | Status: DC | PRN
  Administered 2014-10-04 (×8): 2 mg via INTRAVENOUS
  Filled 2014-10-04 (×8): qty 1

## 2014-10-04 MED ORDER — NOREPINEPHRINE BITARTRATE 1 MG/ML IV SOLN
0.0000 ug/min | INTRAVENOUS | Status: DC
  Administered 2014-10-04: 5 ug/min via INTRAVENOUS
  Filled 2014-10-04: qty 4

## 2014-10-04 MED ORDER — FENTANYL CITRATE 0.05 MG/ML IJ SOLN
100.0000 ug | Freq: Once | INTRAMUSCULAR | Status: AC
  Administered 2014-10-04: 100 ug via INTRAVENOUS
  Filled 2014-10-04: qty 2

## 2014-10-04 MED ORDER — ONDANSETRON HCL 4 MG/2ML IJ SOLN: 4.0000 mg | Freq: Four times a day (QID) | INTRAMUSCULAR | Status: DC | PRN

## 2014-10-04 MED ORDER — LORAZEPAM 2 MG/ML IJ SOLN: 2.0000 mg | INTRAMUSCULAR | Status: DC | PRN

## 2014-10-04 MED ORDER — LORAZEPAM 2 MG/ML IJ SOLN
1.0000 mg | INTRAMUSCULAR | Status: DC | PRN
  Administered 2014-10-04: 1 mg via INTRAVENOUS
  Filled 2014-10-04: qty 1

## 2014-10-04 MED ORDER — PROPOFOL 10 MG/ML IV EMUL
5.0000 ug/kg/min | INTRAVENOUS | Status: DC
  Administered 2014-10-04: 20 ug/kg/min via INTRAVENOUS
  Filled 2014-10-04: qty 100

## 2014-10-04 MED ORDER — ALBUTEROL SULFATE (2.5 MG/3ML) 0.083% IN NEBU: 2.5000 mg | INHALATION_SOLUTION | RESPIRATORY_TRACT | Status: DC | PRN

## 2014-10-04 MED ORDER — SODIUM CHLORIDE 0.9 % IV SOLN
5.0000 mg/h | INTRAVENOUS | Status: DC
  Administered 2014-10-04: 5 mg/h via INTRAVENOUS
  Filled 2014-10-04: qty 10

## 2014-10-04 MED ORDER — SODIUM CHLORIDE 0.9 % IV SOLN
INTRAVENOUS | Status: DC
Start: 2014-10-04 — End: 2014-10-05

## 2014-10-04 MED ORDER — SCOPOLAMINE 1 MG/3DAYS TD PT72
1.0000 | MEDICATED_PATCH | TRANSDERMAL | Status: DC
  Administered 2014-10-04: 1.5 mg via TRANSDERMAL
  Filled 2014-10-04: qty 1

## 2014-10-04 MED ORDER — LORAZEPAM 2 MG/ML IJ SOLN: 2.0000 mg | INTRAMUSCULAR | Status: DC

## 2014-10-04 MED ORDER — MORPHINE BOLUS VIA INFUSION
2.0000 mg | INTRAVENOUS | Status: DC | PRN
  Filled 2014-10-04: qty 2

## 2014-10-04 MED ORDER — SODIUM CHLORIDE 0.9 % IV SOLN
8.0000 mg/h | INTRAVENOUS | Status: DC
  Filled 2014-10-04: qty 10

## 2014-10-04 MED ORDER — MORPHINE SULFATE 4 MG/ML IJ SOLN
4.0000 mg | Freq: Once | INTRAMUSCULAR | Status: AC
  Administered 2014-10-04: 4 mg via INTRAVENOUS
  Filled 2014-10-04: qty 1

## 2014-10-04 MED ORDER — ONDANSETRON HCL 4 MG PO TABS: 4.0000 mg | ORAL_TABLET | Freq: Four times a day (QID) | ORAL | Status: DC | PRN

## 2014-10-05 ENCOUNTER — Ambulatory Visit: Payer: Self-pay | Admitting: Physician Assistant

## 2014-10-05 ENCOUNTER — Other Ambulatory Visit: Payer: Self-pay

## 2014-10-11 DIAGNOSIS — C799 Secondary malignant neoplasm of unspecified site: Secondary | ICD-10-CM | POA: Insufficient documentation

## 2014-10-13 ENCOUNTER — Ambulatory Visit (HOSPITAL_COMMUNITY): Payer: BC Managed Care – PPO

## 2014-10-16 NOTE — ED Notes (Signed)
Her family remain with her; and are speaking with our Education officer, museum as I write this.

## 2014-10-16 NOTE — ED Notes (Signed)
Family at bedside. 

## 2014-10-16 NOTE — ED Notes (Signed)
Per EMS. Unresponsive RR 30 with aggressive work of breathing. 02 sat 50% on NSR placed by Fire department. I/O placed by EMS to left tibia without difficulty. Pt in route apneic/ bagged for support. Emergency traffic to [facility]. Intubated upon arrival by EDP Ralene Bathe

## 2014-10-16 NOTE — ED Notes (Signed)
Bed: OI37 Expected date:  Expected time:  Means of arrival:  Comments: For resus B

## 2014-10-16 NOTE — ED Notes (Addendum)
Family at bedside. SON PRESENT TO DISCUSS CODE STATUS. EDP REES PRESENT

## 2014-10-16 NOTE — ED Notes (Signed)
I have just been informed by Dr. Ralene Bathe that we will shortly be extubating pt. And providing comfort care--she has just had an extensive family meeting with her husband and son.

## 2014-10-16 NOTE — ED Notes (Signed)
CODE ENDED - CORRECTION IN DOCUMENTATION

## 2014-10-16 NOTE — ED Notes (Signed)
MD at bedside. 

## 2014-10-16 NOTE — Progress Notes (Signed)
Wasted 226ml ( 220 mg of IV morphine 1mg /ml solution). Witnessed by [employer], rn. Vwilliams,rn.

## 2014-10-16 NOTE — Consult Note (Signed)
Patient ZO:XWRUEAV K West      DOB: 09-22-1950      WUJ:[id number]     Consult Note from the Palliative Medicine Team at [place] Requested by: Dr Wendee Beavers    PCP: [name], MD Reason for Consultation: Clarification of [place] and options     Phone Number:[phone]  Assessment and Plan  Patient is transitioning at EOL, symptoms unmanaged at this time. Patient is tachycardic and tachycepnic   Consult is for emotional support  and symptom recommendation at EOL  This NP [name] reviewed medical records, received report from team, assessed the patient and then meet at the patient's bedside along with her husband, son  and gathered family  to discuss natural trajectory and expectations at EOL.    Discussed symptom (dyspnea)management strategy with morphine gtt and boluses. -increase base rate to 8 mg/hr with boluses -increase dose intervals for ativan  Chaplain involved and supporting family.  Prognosis is likely hours, expect hospital death.  Questions and concerns addressed. PMT will continue to support holistically.     Brief HPI:     64 y.o. female with h/o recent diagnosis of stage 4 NSCLC , admitted for acute hypoxic respiratory failure. Family currently wishing for comfort measures.  Prognosis is likely hours    ROS: unable to illicit due to decreased cogniton   PMH:  Past Medical History  Diagnosis Date  . History of suicidal ideation   . HTN (hypertension)   . HLD (hyperlipidemia)   . Shortness of breath   . Anxiety and depression   . Menopause   . Benign hematuria   . Impaired fasting glucose   . Other abnormal glucose   . Cancer   . COPD (chronic obstructive pulmonary disease)      PSH: Past Surgical History  Procedure Laterality Date  . Colonoscopy  11/21/2003     rare early left-sided diverticula remaining, low sigmoid anastomosis, internal and external hemorrhoids,   . Cystoscopy    . Lymph node biopsy Right 08/09/2014   Procedure: Right axillary lymph node biopsy;  Surgeon: Alphonsa Overall, MD;  Location: [facility];  Service: General;  Laterality: Right;   I have reviewed the [place] and SH and  If appropriate update it with new information. No Known Allergies Scheduled Meds: . LORazepam  2 mg Intravenous NOW  . scopolamine  1 patch Transdermal Q72H   Continuous Infusions: . sodium chloride 10 mL/hr at 2014-10-21 1546  . morphine     PRN Meds:.LORazepam, morphine, ondansetron **OR** ondansetron (ZOFRAN) IV    BP 129/57 mmHg  Pulse 118  Temp(Src) 101.2 F (38.4 C) (Axillary)  Resp 45  Wt 88.3 kg (194 lb 10.7 oz)  SpO2 20%   PPS: 10 %  No intake or output data in the 24 hours ending 2014-10-21 1648  Physical Exam:  General: unresponsive to gentle touch, distressed, tachycardic/tachycepnic HEENT:  Dry buccal membranes Skin: gray, fingers and toes purple   Labs: CBC    Component Value Date/Time   WBC 17.7* 09/15/2014 0400   WBC 8.6 09/01/2014 0835   RBC 4.26 09/15/2014 0400   RBC 3.82 09/01/2014 0835   RBC 3.64* 08/15/2014 0425   HGB 11.6* 09/15/2014 0400   HGB 10.6* 09/01/2014 0835   HCT 36.9 09/15/2014 0400   HCT 34.0* 09/01/2014 0835   PLT 281 09/15/2014 0400   PLT 299 09/01/2014 0835   MCV 86.6 09/15/2014 0400   MCV 89.0 09/01/2014 0835   MCH  27.2 09/15/2014 0400   MCH 27.7 09/01/2014 0835   MCHC 31.4 09/15/2014 0400   MCHC 31.2* 09/01/2014 0835   RDW 14.3 09/15/2014 0400   RDW 14.2 09/01/2014 0835   LYMPHSABS 1.1 09/06/2014 1208   LYMPHSABS 1.2 09/01/2014 0835   MONOABS 1.0 09/06/2014 1208   MONOABS 0.6 09/01/2014 0835   EOSABS 0.1 09/06/2014 1208   EOSABS 0.0 09/01/2014 0835   BASOSABS 0.0 09/06/2014 1208   BASOSABS 0.0 09/01/2014 0835    BMET    Component Value Date/Time   NA 133* 09/15/2014 0400   NA 138 09/01/2014 0835   K 4.4 09/15/2014 0400   K 4.8 09/01/2014 0835   CL 96 09/15/2014 0400   CO2 26 09/15/2014 0400   CO2 25 09/01/2014 0835   GLUCOSE 170* 09/15/2014  0400   GLUCOSE 120 09/01/2014 0835   BUN 40* 09/15/2014 0400   BUN 13.5 09/01/2014 0835   CREATININE 0.46* 09/20/2014 0432   CREATININE 0.8 09/01/2014 0835   CALCIUM 9.0 09/15/2014 0400   CALCIUM 8.9 09/01/2014 0835   GFRNONAA >90 09/20/2014 0432   GFRAA >90 09/20/2014 0432    CMP     Component Value Date/Time   NA 133* 09/15/2014 0400   NA 138 09/01/2014 0835   K 4.4 09/15/2014 0400   K 4.8 09/01/2014 0835   CL 96 09/15/2014 0400   CO2 26 09/15/2014 0400   CO2 25 09/01/2014 0835   GLUCOSE 170* 09/15/2014 0400   GLUCOSE 120 09/01/2014 0835   BUN 40* 09/15/2014 0400   BUN 13.5 09/01/2014 0835   CREATININE 0.46* 09/20/2014 0432   CREATININE 0.8 09/01/2014 0835   CALCIUM 9.0 09/15/2014 0400   CALCIUM 8.9 09/01/2014 0835   PROT 6.6 09/06/2014 1208   PROT 6.5 09/01/2014 0835   ALBUMIN 2.5* 09/06/2014 1208   ALBUMIN 2.5* 09/01/2014 0835   AST 21 09/06/2014 1208   AST 17 09/01/2014 0835   ALT 12 09/06/2014 1208   ALT 20 09/01/2014 0835   ALKPHOS 137* 09/06/2014 1208   ALKPHOS 172* 09/01/2014 0835   BILITOT 0.9 09/06/2014 1208   BILITOT 0.60 09/01/2014 0835   GFRNONAA >90 09/20/2014 0432   GFRAA >90 09/20/2014 0432     Time In Time Out Total Time Spent with Patient Total Overall Time  1600 1715 70 min 75 min    Greater than 50%  of this time was spent counseling and coordinating care related to the above assessment and plan.   [name] NP  Palliative Medicine Team Team Phone # [phone] Pager [phone]  Discussed with Dr Wendee Beavers

## 2014-10-16 NOTE — Progress Notes (Signed)
Spiritual care provided support around transition to comfort care / anticipatory grief.  Emily West's spouse Emily West), son Emily West), brother and extended family present.

## 2014-10-16 NOTE — Progress Notes (Signed)
CSW met with pt, pt spouse, and pt son at bedside. Pt currently on non rebreather and having difficulty breathing. Per discussion with EDP patient is actively dying and needs residential hospice. CSW spoke with pt son, who stated that patient had been home with [facility] and had be referred to hospice and palliative care of [place], however patient had improved and patietn did not receive hospice services in the home. Pt son requested residential hospice placement, and not home with hospice at this time. Pt son prefers [facility] and the [facility] hospice home as a second choice.   CSW contacted [facility] liaison regarding placement.   Noreene Larsson [phone]  ED CSW 10/06/2014 10:51 AM

## 2014-10-16 NOTE — Procedures (Signed)
Extubation Procedure Note  Patient Details:   Name: Janesia Joswick [facility] DOB: 09/30/1950 MRN: [id number]   Airway Documentation:     Evaluation  O2 sats: transiently fell during during procedure Complications: No apparent complications Patient did tolerate procedure well. Bilateral Breath Sounds: Coarse crackles   Yes  Johnette Abraham Oct 18, 2014, 9:48 AM

## 2014-10-16 NOTE — H&P (Signed)
[facility] History and Physical  Shiva [facility] WPY:[id number] DOB: July 15, 1951 DOA: 10/29/2014  Referring physician: Dr. Ralene Bathe PCP: [name], MD   Chief Complaint: SOB  HPI: Emily West is a 64 y.o. female  64 y.o. female with h/o recent diagnosis of stage 4 NSCLC , admitted for acute hypoxic respiratory failure. Family currently wishing for comfort measures.  We have been consulted to help facilitate medical management and placement into residential hospice.   Review of Systems:  Unable to assess due to respiratory distress.  Past Medical History  Diagnosis Date  . History of suicidal ideation   . HTN (hypertension)   . HLD (hyperlipidemia)   . Shortness of breath   . Anxiety and depression   . Menopause   . Benign hematuria   . Impaired fasting glucose   . Other abnormal glucose   . Cancer   . COPD (chronic obstructive pulmonary disease)    Past Surgical History  Procedure Laterality Date  . Colonoscopy  11/21/2003     rare early left-sided diverticula remaining, low sigmoid anastomosis, internal and external hemorrhoids,   . Cystoscopy    . Lymph node biopsy Right 08/09/2014    Procedure: Right axillary lymph node biopsy;  Surgeon: Alphonsa Overall, MD;  Location: [facility];  Service: General;  Laterality: Right;   Social History:  reports that she quit smoking about 8 weeks ago. She started smoking about 37 years ago. She does not have any smokeless tobacco history on file. She reports that she does not drink alcohol or use illicit drugs.  No Known Allergies  Family History  Problem Relation Age of Onset  . Emphysema Father   . Depression Mother   . Glaucoma Mother   . Thyroid disease Mother   . CVA Mother   . Breast cancer Mother   . Colon cancer Maternal Grandmother   . CVA Brother 21    1/3  . Thyroid disease Sister     1/2, partial thyroidectomy  . Goiter Sister     2/2     Prior to Admission medications   Medication Sig Start Date  End Date Taking? Authorizing Provider  antiseptic oral rinse (CPC / CETYLPYRIDINIUM CHLORIDE 0.05%) 0.05 % LIQD solution 7 mLs by Mouth Rinse route 2 times daily at 12 noon and 4 pm. 09/21/14  Yes Robbie Lis, MD  chlorhexidine (PERIDEX) 0.12 % solution 15 mLs by Mouth Rinse route 2 (two) times daily. 09/21/14  Yes Robbie Lis, MD  ENSURE PLUS (ENSURE PLUS) LIQD Take 237 mLs by mouth 2 (two) times daily.   Yes Historical Provider, MD  esomeprazole (NEXIUM) 20 MG capsule Take 1 capsule (20 mg total) by mouth every morning. 09/21/14  Yes Robbie Lis, MD  ferrous sulfate 325 (65 FE) MG tablet Take 1 tablet (325 mg total) by mouth 2 (two) times daily with a meal. 08/22/14  Yes Debbe Odea, MD  FLUoxetine (PROZAC) 40 MG capsule Take 1 capsule (40 mg total) by mouth 2 (two) times daily. 09/21/14  Yes Robbie Lis, MD  irbesartan (AVAPRO) 75 MG tablet Take 1 tablet (75 mg total) by mouth daily. 09/21/14  Yes Robbie Lis, MD  oxyCODONE (OXY IR/ROXICODONE) 5 MG immediate release tablet Take 1 tablet (5 mg total) by mouth every 4 (four) hours as needed (moderat e pain). 09/21/14  Yes Robbie Lis, MD  OxyCODONE HCl ER 30 MG T12A Take 30 mg by mouth every 8 (eight) hours. 09/21/14  Yes Robbie Lis, MD  polyethylene glycol [facility] / Floria Raveling) packet Take 17 g by mouth daily. 09/21/14  Yes Robbie Lis, MD  sodium chloride 0.9 % injection 10 mLs by Intracatheter route every 12 (twelve) hours. 09/21/14  Yes Robbie Lis, MD  tiotropium (SPIRIVA) 18 MCG inhalation capsule Place 1 capsule (18 mcg total) into inhaler and inhale daily. 09/21/14  Yes Robbie Lis, MD  cyclobenzaprine (FLEXERIL) 10 MG tablet Take 1 tablet (10 mg total) by mouth at bedtime. Patient not taking: Reported on 10-08-14 08/22/14   Debbe Odea, MD  predniSONE (DELTASONE) 10 MG tablet Take 3 tablets (30 mg total) by mouth daily with breakfast. Patient not taking: Reported on 10/08/2014 09/21/14   Robbie Lis, MD  rosuvastatin (CRESTOR) 10 MG tablet  Take 1 tablet (10 mg total) by mouth at bedtime. Patient not taking: Reported on 10-08-14 09/21/14   Robbie Lis, MD  zolpidem (AMBIEN) 10 MG tablet Take 0.5-1 tablets (5-10 mg total) by mouth at bedtime as needed for sleep. Patient not taking: Reported on 10-08-2014 08/22/14   Debbe Odea, MD   Physical Exam: Filed Vitals:   08-Oct-2014 0923 2014/10/08 0943 10-08-2014 1127 10/08/14 1207  BP:  146/56 100/41 104/44  Pulse: 114 118 109 114  Temp: 100.2 F (37.9 C)  99.8 F (37.7 C)   TempSrc: Core (Comment)     Resp: 44 53 41 54  Weight:      SpO2: 95% 66% 85% 81%    Wt Readings from Last 3 Encounters:  2014-10-08 88.3 kg (194 lb 10.7 oz)  09/12/14 88.3 kg (194 lb 10.7 oz)  08/09/14 100.9 kg (222 lb 7.1 oz)    General:  Patient with increased work of breathing and respiratory distress Eyes: nonicteric no dyscharge ENT: Normal exterior appearance  Neck: no LAD, masses or thyromegaly Cardiovascular: S1 and S2 present, no m/r/g.  Respiratory: Breathing rapidly with increased work of breathing on nonrebreather. Abdomen: soft, no guarding,nd Skin: no rash or induration seen on limited exam Musculoskeletal: grossly normal tone BUE/BLE Psychiatric: Unable to assess secondary to respiratory distress Neurologic: Unable to assess secondary to respiratory distress           Labs on Admission:  Basic Metabolic Panel: No results for input(s): NA, K, CL, CO2, GLUCOSE, BUN, CREATININE, CALCIUM, MG, PHOS in the last 168 hours. Liver Function Tests: No results for input(s): AST, ALT, ALKPHOS, BILITOT, PROT, ALBUMIN in the last 168 hours. No results for input(s): LIPASE, AMYLASE in the last 168 hours. No results for input(s): AMMONIA in the last 168 hours. CBC: No results for input(s): WBC, NEUTROABS, HGB, HCT, MCV, PLT in the last 168 hours. Cardiac Enzymes: No results for input(s): CKTOTAL, CKMB, CKMBINDEX, TROPONINI in the last 168 hours.  BNP (last 3 results)  Recent Labs  08/15/14 0425   PROBNP 114.8   CBG:  Recent Labs Lab 10-08-14 0729  GLUCAP 109*    Radiological Exams on Admission: No results found.   Assessment/Plan Active Problems:   Acute respiratory failure - We'll hold all medications as patient is currently respiratory distress. - Provide morphine and Ativan for air hunger - Transfer to palliative care floor until patient can be transferred to residential hospice - Supplemental oxygen and albuterol for comfort  Code Status: DO NOT RESUSCITATE DVT Prophylaxis: Patient currently comfort care measures Family Communication: Discussed with family at bedside Disposition Plan: Prognosis poor and possible the patient may past while in hospital. We will seek  consultation from social worker to 4 residential hospice pending hospital course  Time spent: > 50 minutes  Velvet Bathe [facility] Pager [phone]

## 2014-10-16 NOTE — Progress Notes (Signed)
ED CM spoke with EDP, Ralene Bathe.  EDP spoke with ED SW.  Discussed pt on non rebreather. Preference for hospice facility if not admission until hospice facility available

## 2014-10-16 NOTE — Progress Notes (Signed)
Clinical Social Work Department BRIEF PSYCHOSOCIAL ASSESSMENT 2014-10-21  Patient:  Emily West,Emily West     Account Number:  [id number]     Admit date:  2014/10/21  Clinical Social Worker:  Kinsie Belford [name]  Date/Time:  October 21, 2014 11:30 AM  Referred by:  Physician  Date Referred:  21-Oct-2014 Referred for  Residential hospice placement   Other Referral:   Interview type:  Family Other interview type:   Pt son, Brindle Leyba and Spouse, Tanayia Wahlquist    PSYCHOSOCIAL DATA Living Status:  FAMILY Admitted from facility:   Level of care:   Primary support name:  Tim [phone] Primary support relationship to patient:  SPOUSE Degree of support available:   strong, pt husband is primary care giver along with strong support from pt son, genavie boettger [phone]    CURRENT CONCERNS Current Concerns  Post-Acute Placement   Other Concerns:    SOCIAL WORK ASSESSMENT / PLAN CSW met with pt, pt spouse, and pt son at bedsdie. Pt currently having difficulty breathing and on non breather. Per discussion with ED physician, patient is actively dying related to metastatic lung cancer.    Patient arrived by EMS, EMS report husband wants everything done for resuscitation. Son arrived to the Emergency Department and family together discussed code status with ED physician, patient was extubated. Pt family requesting comfort care only for patient.    Per discussion with family, patient had been referred to [place], however due to pt improvement, referral was cancelled and patietn only reiceved home health services through [facility]. At this time patient is not connected to hospice services. Pt family requesting residential hospice home placement. Patient spouse and son share that they are unable to care for patient fully at this time. CSW provided choice to pt family regarding residential hospice options. Pt family chose [facility] and [place].    CSW contacted [facility] and [place]. No beds available at [facility] and [facility] may have beds however unavialble for assessment unti late afternoon or later. CSW discussed with physician who is consulting hosptialist and palliative care for comfort measures.    CSW provided supportive counseling for patient family. Patient family requesting patient not to be transfered to another facility if patient would not be stable enough for transport.Pt spouse tearful during assessment. Pt son trying to support pt husband.   Assessment/plan status:  Psychosocial Support/Ongoing Assessment of Needs Other assessment/ plan:   Information/referral to community resources:   Residential Hospice Facility    PATIENT'S/FAMILY'S RESPONSE TO PLAN OF CARE: Patient family thanked csw for concern and support. Pt family interested in Residential Hospice Placement at [facility] or [facility] if stable for transport.       Noreene Larsson [phone]  ED CSW 10-21-2014 11:48 AM

## 2014-10-16 NOTE — ED Provider Notes (Signed)
CSN: [id number]     Arrival date & time 10/06/14  3710 History   First MD Initiated Contact with Patient Oct 06, 2014 [id number]     No chief complaint on file.   The history is provided by the patient. No language interpreter was used.   Pt presents by EMS for respiratory failure. Level V caveat due to patient being unresponsive.  Per EMS pt had increased difficulty breathing and hypoxia throughout the night.  When EMS arrived she was noted to be hypoxic, sats in the 60s on NRB.  EMS initiated IO and BVM ventilation.  Per EMS report husband wants everything done for resuscitation.    Past Medical History  Diagnosis Date  . History of suicidal ideation   . HTN (hypertension)   . HLD (hyperlipidemia)   . Shortness of breath   . Anxiety and depression   . Menopause   . Benign hematuria   . Impaired fasting glucose   . Other abnormal glucose   . Cancer    Past Surgical History  Procedure Laterality Date  . Colonoscopy  11/21/2003     rare early left-sided diverticula remaining, low sigmoid anastomosis, internal and external hemorrhoids,   . Cystoscopy    . Lymph node biopsy Right 08/09/2014    Procedure: Right axillary lymph node biopsy;  Surgeon: Alphonsa Overall, MD;  Location: [facility];  Service: General;  Laterality: Right;   Family History  Problem Relation Age of Onset  . Emphysema Father   . Depression Mother   . Glaucoma Mother   . Thyroid disease Mother   . CVA Mother   . Breast cancer Mother   . Colon cancer Maternal Grandmother   . CVA Brother 43    1/3  . Thyroid disease Sister     1/2, partial thyroidectomy  . Goiter Sister     2/2   History  Substance Use Topics  . Smoking status: Former Smoker -- 0.50 packs/day for 35 years    Start date: 09/15/1977    Quit date: 08/07/2014  . Smokeless tobacco: Not on file  . Alcohol Use: No     Comment: none in a month    OB History    No data available     Review of Systems  Unable to perform ROS     Allergies  Review of  patient's allergies indicates no known allergies.  Home Medications   Prior to Admission medications   Medication Sig Start Date End Date Taking? Authorizing Provider  antiseptic oral rinse (CPC / CETYLPYRIDINIUM CHLORIDE 0.05%) 0.05 % LIQD solution 7 mLs by Mouth Rinse route 2 times daily at 12 noon and 4 pm. 09/21/14   Robbie Lis, MD  chlorhexidine (PERIDEX) 0.12 % solution 15 mLs by Mouth Rinse route 2 (two) times daily. 09/21/14   Robbie Lis, MD  cyclobenzaprine (FLEXERIL) 10 MG tablet Take 1 tablet (10 mg total) by mouth at bedtime. 08/22/14   Debbe Odea, MD  ENSURE PLUS (ENSURE PLUS) LIQD Take 237 mLs by mouth 2 (two) times daily.    Historical Provider, MD  esomeprazole (NEXIUM) 20 MG capsule Take 1 capsule (20 mg total) by mouth every morning. 09/21/14   Robbie Lis, MD  ferrous sulfate 325 (65 FE) MG tablet Take 1 tablet (325 mg total) by mouth 2 (two) times daily with a meal. 08/22/14   Debbe Odea, MD  FLUoxetine (PROZAC) 40 MG capsule Take 1 capsule (40 mg total) by mouth 2 (two) times daily.  09/21/14   Robbie Lis, MD  irbesartan (AVAPRO) 75 MG tablet Take 1 tablet (75 mg total) by mouth daily. 09/21/14   Robbie Lis, MD  oxyCODONE (OXY IR/ROXICODONE) 5 MG immediate release tablet Take 1 tablet (5 mg total) by mouth every 4 (four) hours as needed (moderat e pain). 09/21/14   Robbie Lis, MD  OxyCODONE HCl ER 30 MG T12A Take 30 mg by mouth every 8 (eight) hours. 09/21/14   Robbie Lis, MD  polyethylene glycol [facility] / Floria Raveling) packet Take 17 g by mouth daily. 09/21/14   Robbie Lis, MD  predniSONE (DELTASONE) 10 MG tablet Take 3 tablets (30 mg total) by mouth daily with breakfast. 09/21/14   Robbie Lis, MD  rosuvastatin (CRESTOR) 10 MG tablet Take 1 tablet (10 mg total) by mouth at bedtime. 09/21/14   Robbie Lis, MD  sodium chloride 0.9 % injection 10 mLs by Intracatheter route every 12 (twelve) hours. 09/21/14   Robbie Lis, MD  tiotropium (SPIRIVA) 18 MCG inhalation capsule  Place 1 capsule (18 mcg total) into inhaler and inhale daily. 09/21/14   Robbie Lis, MD  zolpidem (AMBIEN) 10 MG tablet Take 0.5-1 tablets (5-10 mg total) by mouth at bedtime as needed for sleep. 08/22/14   Debbe Odea, MD   There were no vitals taken for this visit. Physical Exam  Constitutional: She appears well-developed.  HENT:  Head: Normocephalic and atraumatic.  Eyes:  Pupils midsized and nonreactive  Cardiovascular:  Tachycardic, no murmur  Pulmonary/Chest:  Poor air movement bilaterally with diffuse rhonchi  Abdominal: Soft. There is no tenderness. There is no rebound.  Musculoskeletal:  Nonpitting BLE edema. PICC line in RUE, IO in left lower leg.   Neurological:  GCS 1-1-1  Skin: Skin is warm and dry.  Psychiatric:  Unable to assess  Nursing note and vitals reviewed.   ED Course  Procedures (including critical care time)  INTUBATION Performed by: Quintella Reichert  Required items: required blood products, implants, devices, and special equipment available Patient identity confirmed: provided demographic data and hospital-assigned identification number Time out: Immediately prior to procedure a "time out" was called to verify the correct patient, procedure, equipment, support staff and site/side marked as required.  Indications: respiratory failure  Intubation method: Glidescope Laryngoscopy   Preoxygenation: BVM  Sedatives: none Paralytic: none  Tube Size: 7.5 cuffed  Post-procedure assessment: chest rise and ETCO2 monitor Breath sounds: equal and absent over the epigastrium Tube secured with: ETT holder  Patient tolerated the procedure well with no immediate complications.  CRITICAL CARE Performed by: Quintella Reichert   Total critical care time: 30 minutes  Critical care time was exclusive of separately billable procedures and treating other patients.  Critical care was necessary to treat or prevent imminent or life-threatening  deterioration.  Critical care was time spent personally by me on the following activities: development of treatment plan with patient and/or surrogate as well as nursing, discussions with consultants, evaluation of patient's response to treatment, examination of patient, obtaining history from patient or surrogate, ordering and performing treatments and interventions, ordering and review of laboratory studies, ordering and review of radiographic studies, pulse oximetry and re-evaluation of patient's condition.    Labs Review Labs Reviewed - No data to display  Imaging Review No results found.   EKG Interpretation None      MDM   Final diagnoses:  Acute on chronic respiratory failure, unspecified whether with hypoxia or hypercapnia  Metastatic cancer  Discussed with husband and son patient's condition.  They do not think that she would want to be on a ventilator and they would want her to be made comfortable.  Pt is being transitioned to comfort care.  Pt was extubated to NRB and given morphine for pain and air hunger.  Discussed the case with medicine regarding admission to transition to hospice care.  Quintella Reichert, MD 12-Oct-2014 [phone]

## 2014-10-16 NOTE — Progress Notes (Signed)
Called to patient's room by family (at bedside) at about 1715. Upon entering room pt noted to have no respirations. Pt is pale, no palpable pulse and no heartbeat. Pt had expired. Pronounced at 1720 by this rn and [employer], rn. Family, to include son, spouse and other family members at bedside at time of death. MD made aware. No new orders given. MD came up to floor and completed bottom portion of death certificate. MD notified of need to complete certificate in entirety but said there was not a need to by him.[place] donor services made aware per hospital protocol. See [place] donor services questionaire form for details. Patient suitable for eye donation. Eye prep done. Family, at this time has no funeral arrangements/plans made as far as funeral home is concerned. Request that body be kept overnight to allow them time to make decisions. Priest at bedside and has spent some time with patient's family. Family remains at bedside at this time. Vwilliams,rn.

## 2014-10-16 NOTE — Progress Notes (Signed)
Arrived at 5:55pm on a 5:23pm page.  Spent time with the husband and 2 close friends in the room in life review and a review of her last few months, from the time just prior to Thanksgiving 2015, when they received a stage 4 lung cancer diagnosis.  Also prayed with the family.  Engaged in some "after-death" support and planning, assisting in their direction to have a memorial for the deceased.  Her desire is to be cremated.  Family is close and very supportive of one another.  They may be without ministerial support and offered the [place] office as resource for possible needs.  Loann Quill, Chaplain Pager: [phone]

## 2014-10-16 NOTE — Progress Notes (Signed)
Pt received on unit. Agonal breathing with continuous moaning noted. Came up on stretcher from ED. Family at bedside. Vwilliams,rn.

## 2014-10-16 DEATH — deceased

## 2014-10-20 NOTE — Discharge Summary (Signed)
Death Summary  Emily [facility] VJD:[id number] DOB: October 13, 1950 DOA: 10-14-14  PCP: [name], MD   Admit date: 2014/10/14 Date of Death: 10/30/2014  Final Diagnoses:  Active Problems:   Acute respiratory failure   Metastatic cancer   History of present illness:  64 y.o. female with h/o recent diagnosis of stage 4 NSCLC , admitted for acute hypoxic respiratory failure. Family currently wishing for comfort measures.   Hospital Course:  Patient was pronounced at 1720 as patient was found without pulse and no respirations. Most likely due to respiratory distress from stage 4 NSCLC   Time: 1720 14-Oct-2014  Signed:  Velvet Bathe  [facility] October 30, 2014, 9:29 AM

## 2015-12-11 IMAGING — CT CT ANGIO CHEST
1 of 2 series · 19 of 32 positions shown · IV contrast (OMNIPAQUE 350)
Comparison: CT chest 08/08/2014

CLINICAL DATA: Shortness of breath. Recent diagnosis of lung
cancer.

EXAM:
CT ANGIOGRAPHY CHEST WITH CONTRAST
TECHNIQUE: Multidetector CT imaging of the chest was performed using the
standard protocol during bolus administration of intravenous
contrast. Multiplanar CT image reconstructions and MIPs were
obtained to evaluate the vascular anatomy.
CONTRAST:  100mL OMNIPAQUE IOHEXOL 350 MG/ML SOLN

[Series 6: thins for pacs · axial · 0.62mm/px · z∈[+1402,+1620]mm · 19 of 244 slices shown]
[im 13/244  lung]
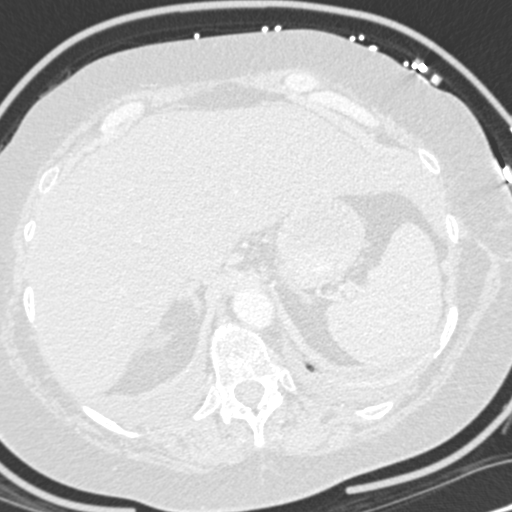
[im 25/244  mediastinal]
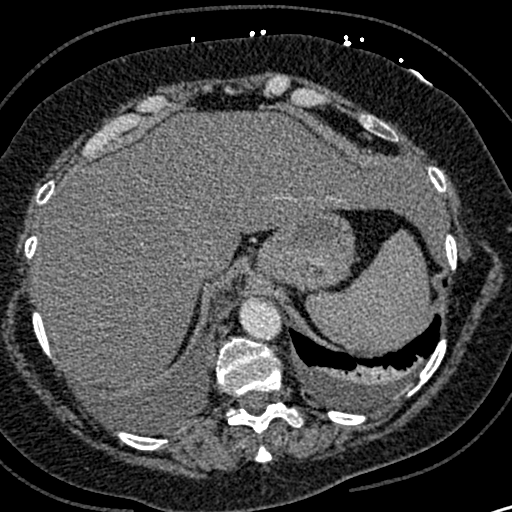
[im 37/244  lung]
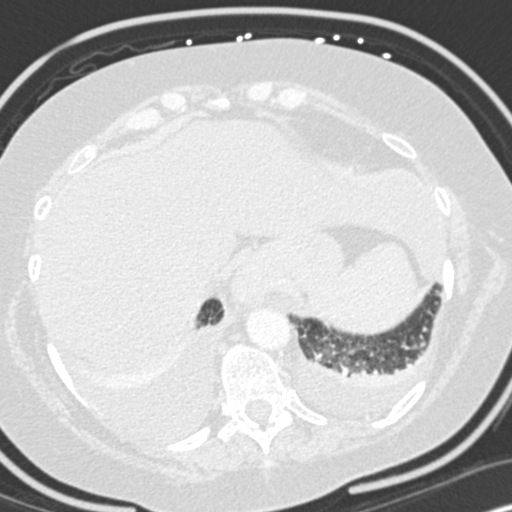
[im 61/244  mediastinal]
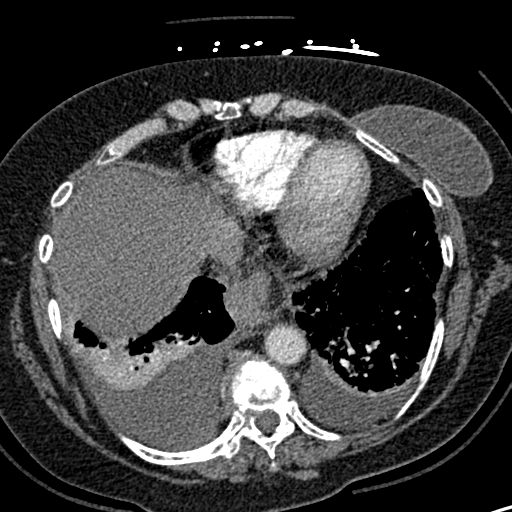
[im 73/244  lung]
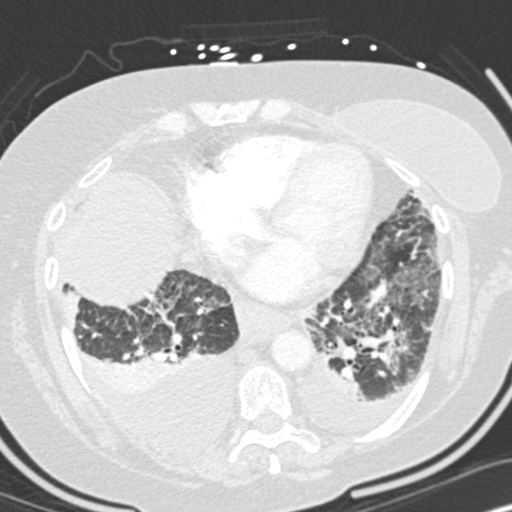
[im 82/244  mediastinal]
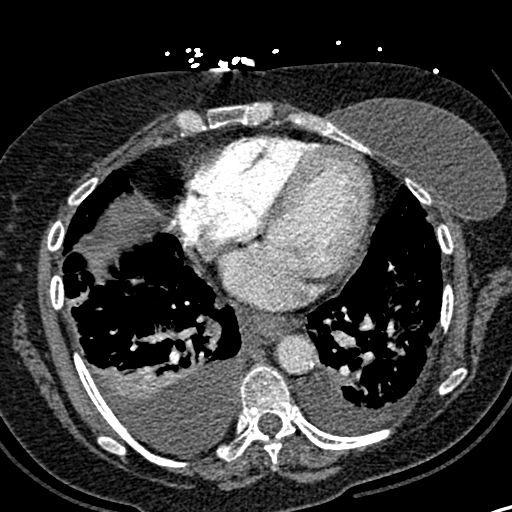
[im 86/244  lung]
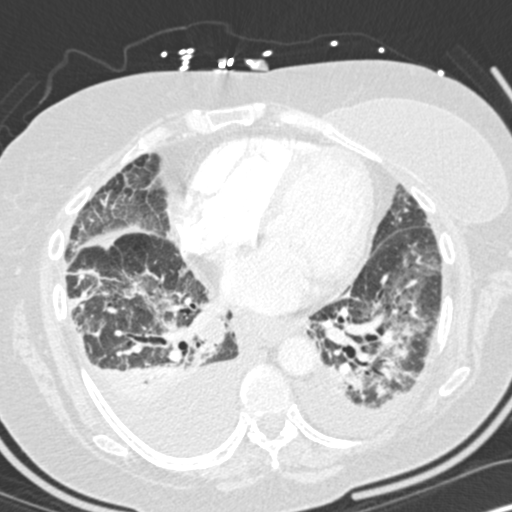
[im 98/244  mediastinal]
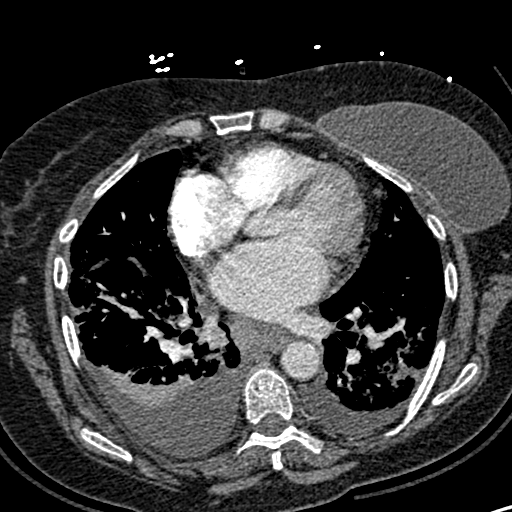
[im 110/244  lung]
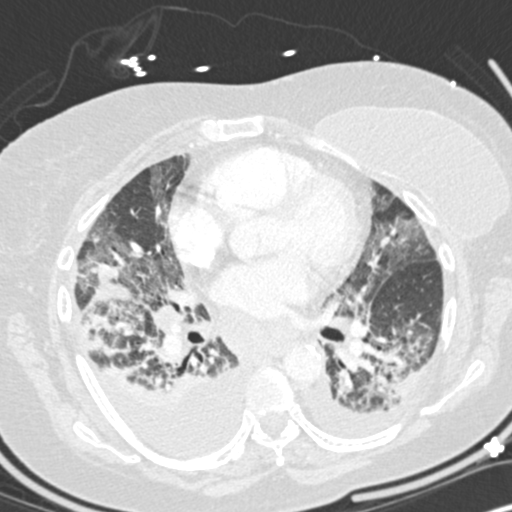
[im 122/244  mediastinal]
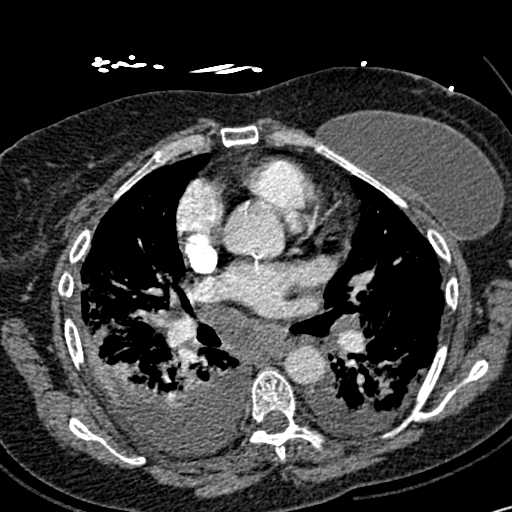
[im 134/244  lung]
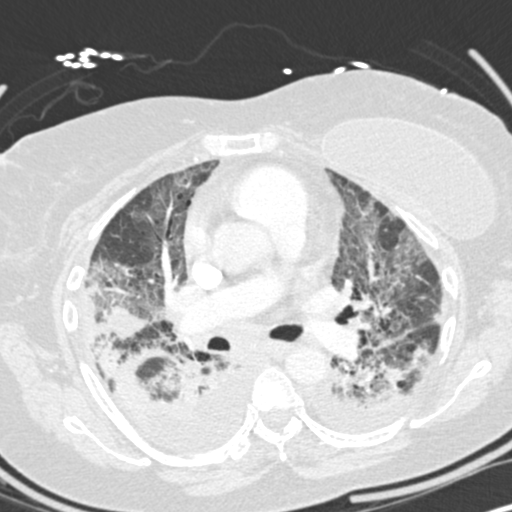
[im 146/244  mediastinal]
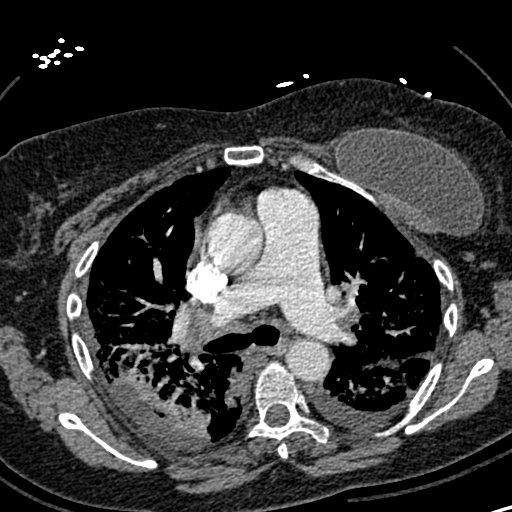
[im 158/244  lung]
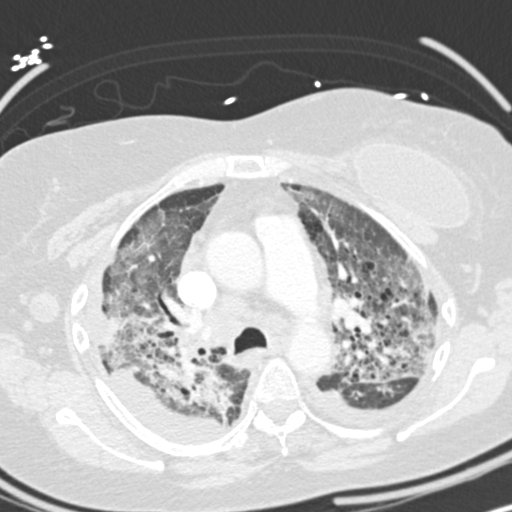
[im 163/244  mediastinal]
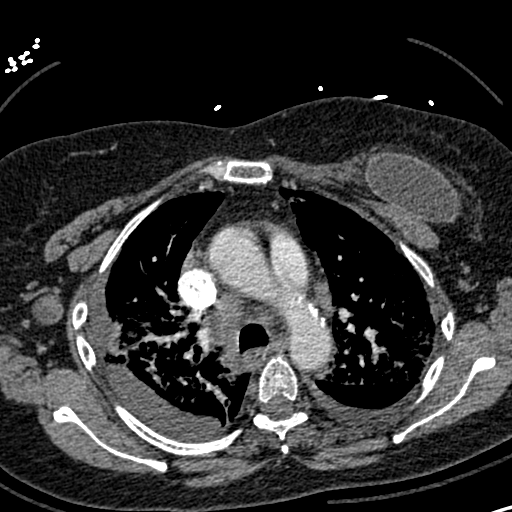
[im 171/244  lung]
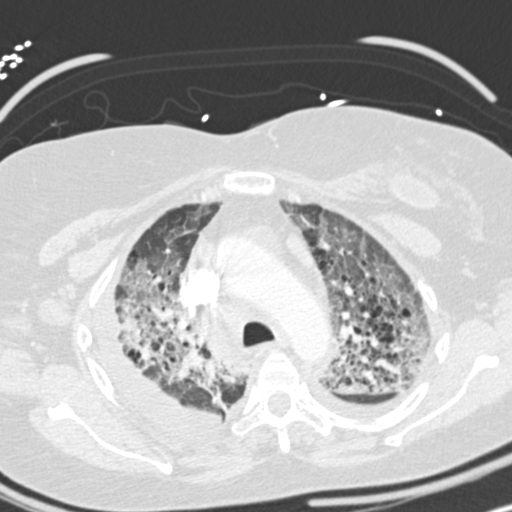
[im 183/244  mediastinal]
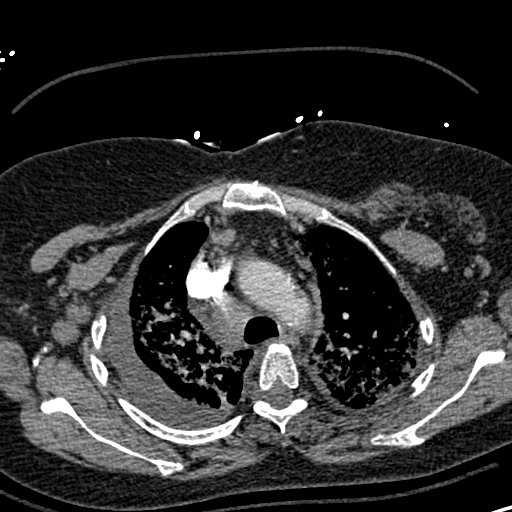
[im 207/244  lung]
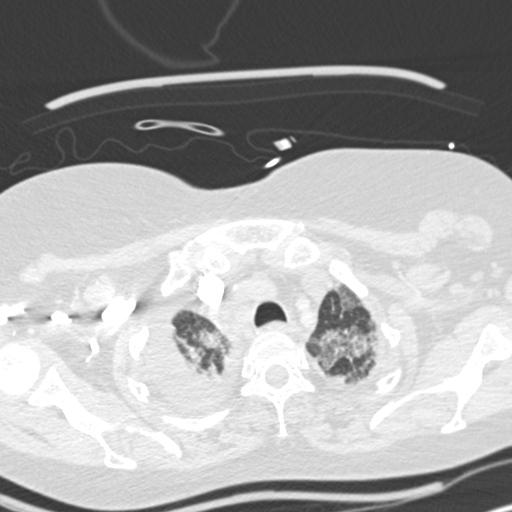
[im 219/244  mediastinal]
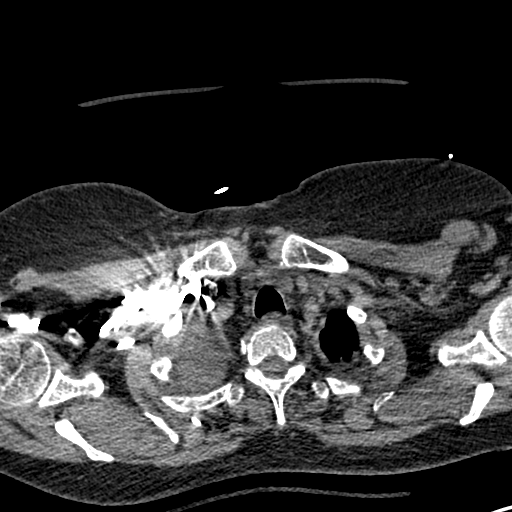
[im 231/244  lung]
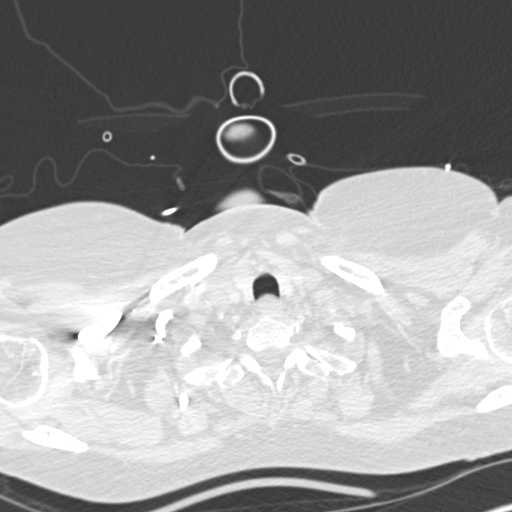

[19 of 32 positions shown; findings below may reference images not displayed]

FINDINGS: There is adequate opacification of the pulmonary arteries. There is
no pulmonary embolus. The main pulmonary artery, right main
pulmonary artery and left main pulmonary arteries are normal in
size. The heart size is normal. There is no pericardial effusion.
There is mild left main coronary artery atherosclerosis.

Bilateral diffuse patchy areas of airspace disease with associated
ground-glass opacities. Moderate right pleural effusion. Small left
pleural effusion. Bibasilar atelectasis. No pneumothorax.

Right supraclavicular lymph node measuring 12 mm in short axis.
Right upper paratracheal lymph node measuring 26 mm in short axis.
Right axillary lymph node measuring 20 mm in short partial necrotic
subcarinal lymph node axis. Measuring 28 mm in short axis.

Prior left mastectomy with a left breast implant present.

There is a severe T10 vertebral body compression fracture with
approximately 75% height loss concerning for pathologic fracture.

Moderate-sized hiatal hernia.

Review of the MIP images confirms the above findings.
IMPRESSION: 1. Interval development of bilateral diffuse patchy airspace disease
with ground-glass opacities and bilateral pleural effusions.
Findings most concerning for an infectious or inflammatory process
including hypersensitivity pneumonitis versus pulmonary edema.
2. Findings concerning for a severe pathologic T10 vertebral body
compression fracture.
3. Mediastinal and right hilar lymphadenopathy persists.

## 2015-12-14 IMAGING — DX DG CHEST 1V PORT
1 series · 1 of 1 positions shown · non-contrast
Comparison: Yesterday

CLINICAL DATA: Acute respiratory failure

EXAM:
PORTABLE CHEST - 1 VIEW

[chest ap]
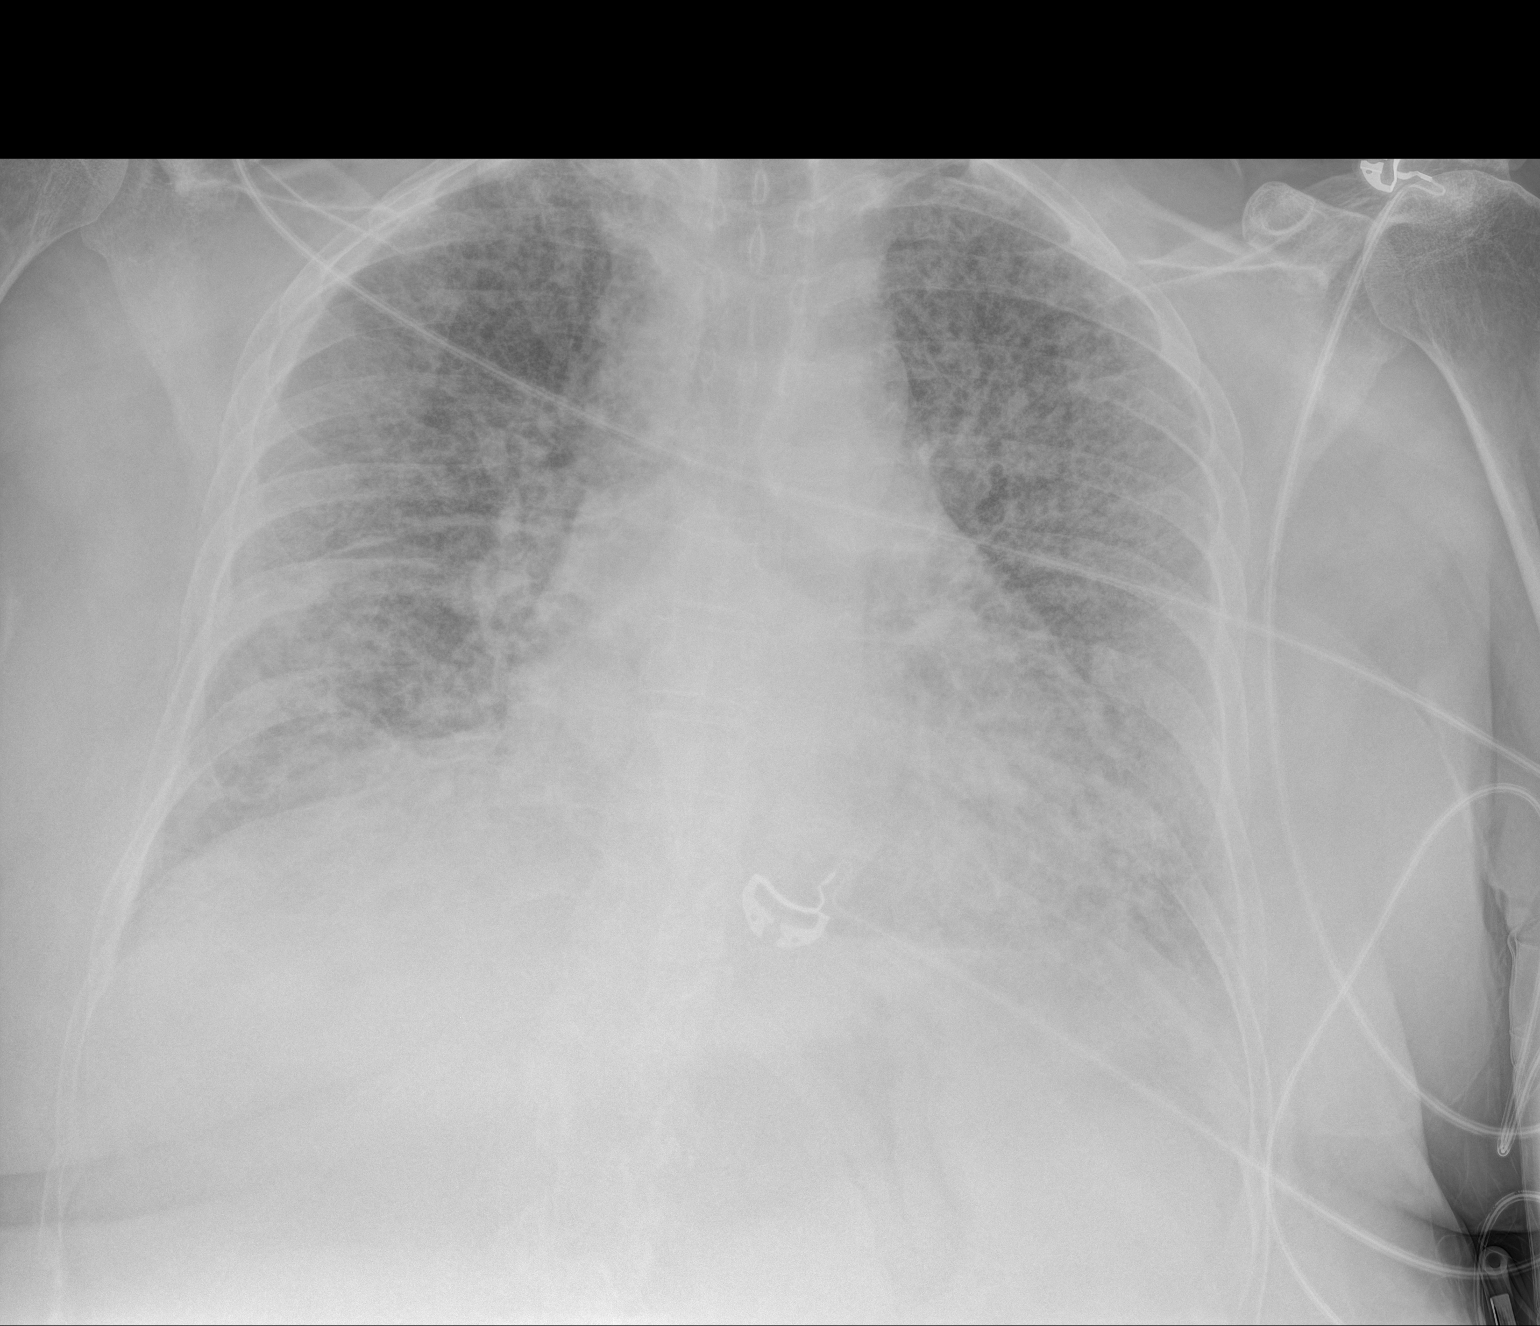

[1 of 1 positions shown; findings below may reference images not displayed]

FINDINGS: Diffuse bilateral heterogeneous opacities are stable. Pleural
effusion at the right apex improved. Mild cardiomegaly. No
pneumothorax.
IMPRESSION: Diffuse airspace disease stable.

Improved right pleural effusion.

## 2015-12-15 IMAGING — DX DG CHEST 1V PORT
1 series · 1 of 1 positions shown · non-contrast
Comparison: 09/09/2014

CLINICAL DATA: Acute respiratory failure.  Lung cancer.

EXAM:
PORTABLE CHEST - 1 VIEW

[chest ap]
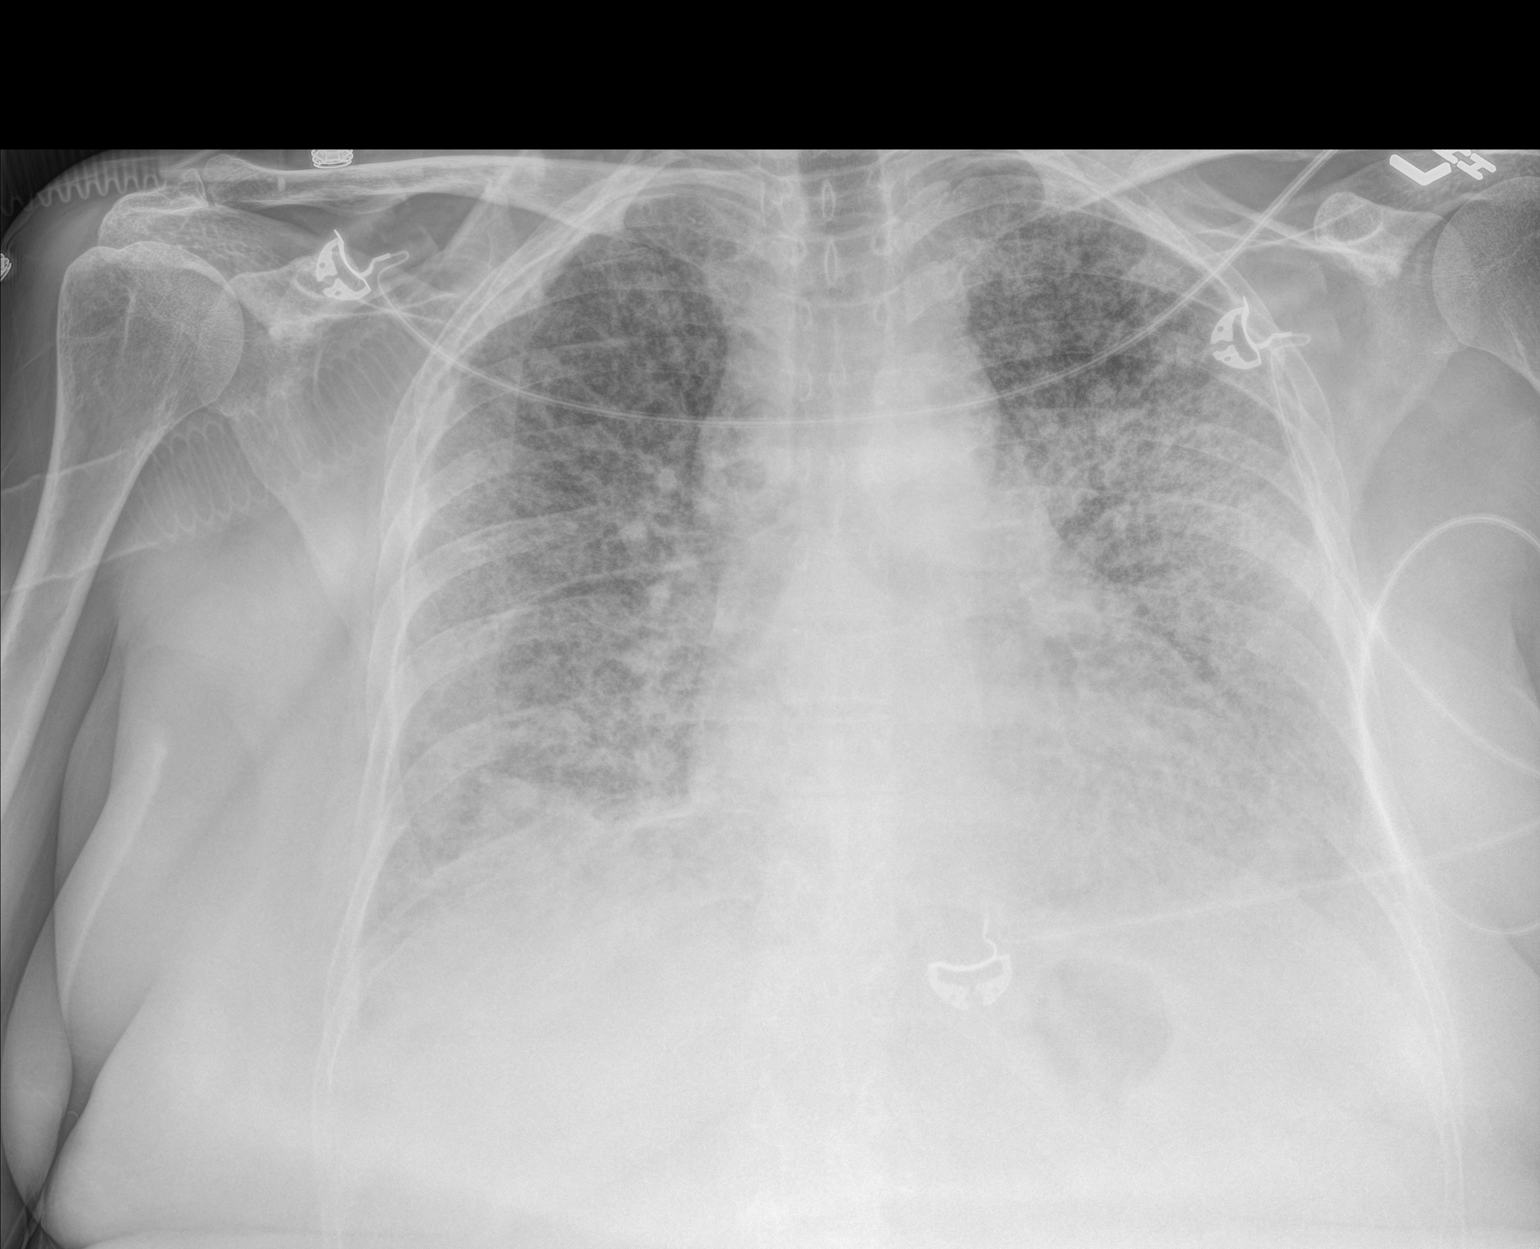

[1 of 1 positions shown; findings below may reference images not displayed]

FINDINGS: Lungs are adequately inflated and demonstrate continued bilateral
diffuse mixed interstitial airspace process without significant
change. More focal consolidation over the right base slightly worse.
Possible small left effusion. Cardiomediastinal silhouette and
remainder of the exam is unchanged.
IMPRESSION: Stable diffuse bilateral mixed interstitial airspace process with
slight worsening more focal airspace density in the right base.
Findings are likely due to infection. Possible small left effusion.

## 2015-12-16 IMAGING — DX DG CHEST 1V PORT
1 series · 1 of 1 positions shown · non-contrast
Comparison: 09/10/2014.

CLINICAL DATA: Respiratory distress.

EXAM:
PORTABLE CHEST - 1 VIEW

[chest ap]
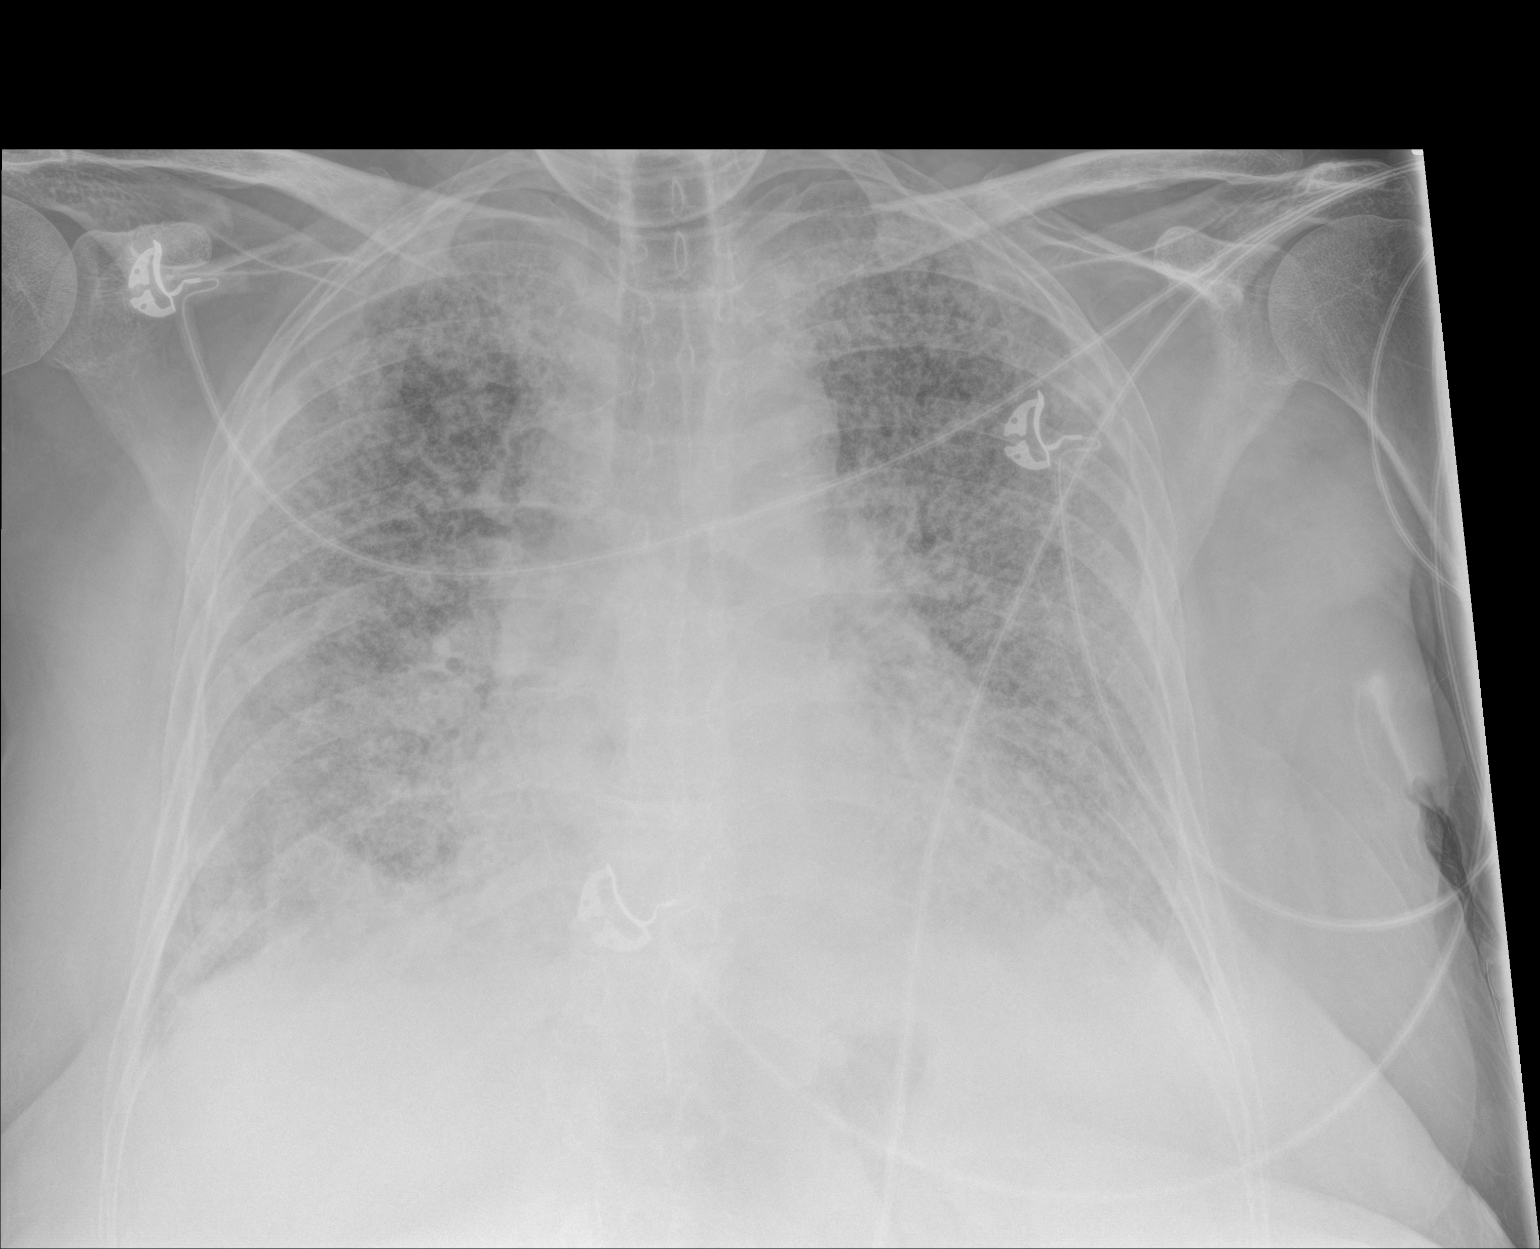

[1 of 1 positions shown; findings below may reference images not displayed]

FINDINGS: Mediastinum and hilar structures are normal. Heart size is stable.
Diffuse severe bilateral interstitial infiltrates are present.
Bibasilar subsegmental atelectasis. No pleural effusion or
pneumothorax. No acute osseus abnormality .
IMPRESSION: 1. Persistent severe bilateral interstitial infiltrates without
significant change from prior exam.

2.  Bibasilar atelectasis.

## 2015-12-17 IMAGING — CR DG CHEST 1V PORT
1 series · 1 of 1 positions shown · non-contrast
Comparison: 09/03/2014.

CLINICAL DATA: Respiratory failure.

EXAM:
PORTABLE CHEST - 1 VIEW

[AP]
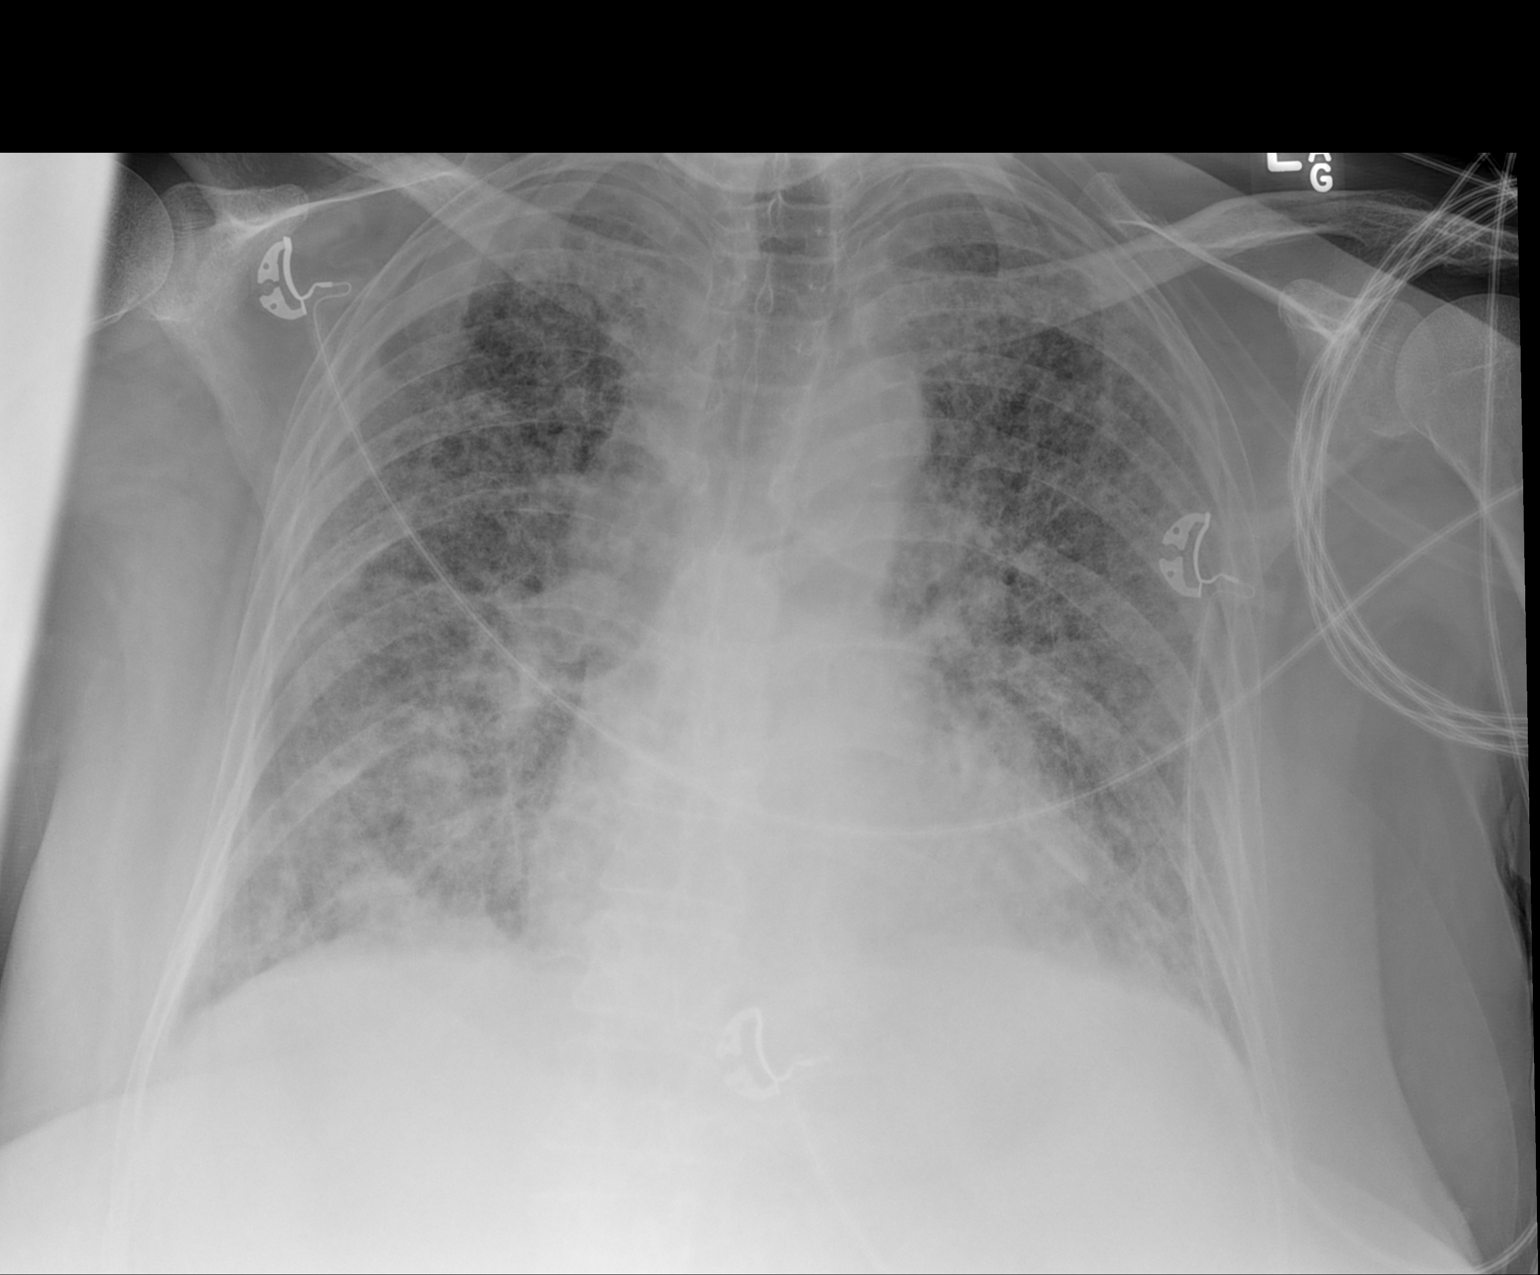

[1 of 1 positions shown; findings below may reference images not displayed]

FINDINGS: Mediastinum and hilar structures are normal. Heart size is stable.
Diffuse severe bilateral pulmonary interstitial infiltrates are
again noted without change. Basilar atelectasis present. No pleural
effusion or pneumothorax.
IMPRESSION: 1. Persistent severe bilateral interstitial infiltrates. No
significant change from prior exam.
2. Bibasilar atelectasis.

## 2016-03-04 ENCOUNTER — Other Ambulatory Visit: Payer: Self-pay | Admitting: Nurse Practitioner
